# Patient Record
Sex: Male | Born: 1975
Health system: Southern US, Community
[De-identification: ages and names within clinical notes are randomized; demographics above are authoritative.]

## PROBLEM LIST (undated history)

## (undated) DIAGNOSIS — E119 Type 2 diabetes mellitus without complications: Secondary | ICD-10-CM

## (undated) DIAGNOSIS — G43909 Migraine, unspecified, not intractable, without status migrainosus: Secondary | ICD-10-CM

## (undated) DIAGNOSIS — N200 Calculus of kidney: Secondary | ICD-10-CM

## (undated) DIAGNOSIS — I10 Essential (primary) hypertension: Secondary | ICD-10-CM

## (undated) DIAGNOSIS — E7800 Pure hypercholesterolemia, unspecified: Secondary | ICD-10-CM

## (undated) DIAGNOSIS — G4730 Sleep apnea, unspecified: Secondary | ICD-10-CM

## (undated) HISTORY — PX: BACK SURGERY: SHX140

## (undated) HISTORY — DX: Migraine, unspecified, not intractable, without status migrainosus: G43.909

## (undated) HISTORY — PX: MOUTH SURGERY: SHX715

---

## 2003-03-29 ENCOUNTER — Ambulatory Visit (HOSPITAL_COMMUNITY): Admission: RE | Admit: 2003-03-29 | Discharge: 2003-03-30 | Payer: Self-pay | Admitting: Neurosurgery

## 2003-03-29 ENCOUNTER — Encounter: Payer: Self-pay | Admitting: Neurosurgery

## 2003-12-15 ENCOUNTER — Encounter: Payer: Self-pay | Admitting: Internal Medicine

## 2004-09-19 ENCOUNTER — Emergency Department (HOSPITAL_COMMUNITY): Admission: EM | Admit: 2004-09-19 | Discharge: 2004-09-19 | Payer: Self-pay | Admitting: Emergency Medicine

## 2004-11-26 ENCOUNTER — Ambulatory Visit: Payer: Self-pay | Admitting: Internal Medicine

## 2004-12-14 ENCOUNTER — Ambulatory Visit (HOSPITAL_BASED_OUTPATIENT_CLINIC_OR_DEPARTMENT_OTHER): Admission: RE | Admit: 2004-12-14 | Discharge: 2004-12-14 | Payer: Self-pay | Admitting: Otolaryngology

## 2005-04-26 ENCOUNTER — Ambulatory Visit: Payer: Self-pay | Admitting: Internal Medicine

## 2005-05-24 ENCOUNTER — Ambulatory Visit: Payer: Self-pay | Admitting: Internal Medicine

## 2005-07-19 ENCOUNTER — Ambulatory Visit: Payer: Self-pay | Admitting: Internal Medicine

## 2006-03-19 ENCOUNTER — Emergency Department (HOSPITAL_COMMUNITY): Admission: EM | Admit: 2006-03-19 | Discharge: 2006-03-19 | Payer: Self-pay | Admitting: Emergency Medicine

## 2006-03-23 ENCOUNTER — Ambulatory Visit: Payer: Self-pay | Admitting: Internal Medicine

## 2006-03-25 ENCOUNTER — Ambulatory Visit: Payer: Self-pay | Admitting: Internal Medicine

## 2006-04-21 ENCOUNTER — Encounter: Admission: RE | Admit: 2006-04-21 | Discharge: 2006-06-08 | Payer: Self-pay | Admitting: Internal Medicine

## 2006-04-22 ENCOUNTER — Ambulatory Visit: Payer: Self-pay | Admitting: Internal Medicine

## 2006-07-12 ENCOUNTER — Ambulatory Visit: Payer: Self-pay | Admitting: Internal Medicine

## 2006-09-13 ENCOUNTER — Ambulatory Visit: Payer: Self-pay | Admitting: Internal Medicine

## 2006-09-19 ENCOUNTER — Ambulatory Visit: Payer: Self-pay | Admitting: Internal Medicine

## 2006-09-28 ENCOUNTER — Ambulatory Visit: Payer: Self-pay | Admitting: Gastroenterology

## 2006-09-29 ENCOUNTER — Ambulatory Visit: Payer: Self-pay | Admitting: Internal Medicine

## 2006-10-17 ENCOUNTER — Ambulatory Visit: Payer: Self-pay | Admitting: Internal Medicine

## 2006-10-17 LAB — CONVERTED CEMR LAB
Albumin: 4.4 g/dL (ref 3.5–5.2)
Alkaline Phosphatase: 50 units/L (ref 39–117)
HCT: 46.5 % (ref 39.0–52.0)
Hemoglobin: 15.7 g/dL (ref 13.0–17.0)
Hgb A1c MFr Bld: 5.7 % (ref 4.6–6.0)
MCHC: 33.8 g/dL (ref 30.0–36.0)
Platelets: 293 10*3/uL (ref 150–400)
RBC: 5.01 M/uL (ref 4.22–5.81)
Total Bilirubin: 0.5 mg/dL (ref 0.3–1.2)
Total Protein: 7.6 g/dL (ref 6.0–8.3)

## 2007-02-17 ENCOUNTER — Ambulatory Visit: Payer: Self-pay | Admitting: Internal Medicine

## 2007-04-05 DIAGNOSIS — I10 Essential (primary) hypertension: Secondary | ICD-10-CM | POA: Insufficient documentation

## 2007-04-05 DIAGNOSIS — E119 Type 2 diabetes mellitus without complications: Secondary | ICD-10-CM | POA: Insufficient documentation

## 2007-04-05 DIAGNOSIS — E785 Hyperlipidemia, unspecified: Secondary | ICD-10-CM | POA: Insufficient documentation

## 2007-04-05 DIAGNOSIS — Z87442 Personal history of urinary calculi: Secondary | ICD-10-CM | POA: Insufficient documentation

## 2007-04-05 DIAGNOSIS — G43909 Migraine, unspecified, not intractable, without status migrainosus: Secondary | ICD-10-CM

## 2007-04-05 HISTORY — DX: Migraine, unspecified, not intractable, without status migrainosus: G43.909

## 2007-11-07 ENCOUNTER — Ambulatory Visit: Payer: Self-pay | Admitting: Internal Medicine

## 2007-11-07 LAB — CONVERTED CEMR LAB
Bilirubin Urine: NEGATIVE
Glucose, Urine, Semiquant: NEGATIVE
Specific Gravity, Urine: 1.01
WBC Urine, dipstick: NEGATIVE

## 2007-11-28 ENCOUNTER — Encounter (INDEPENDENT_AMBULATORY_CARE_PROVIDER_SITE_OTHER): Payer: Self-pay | Admitting: *Deleted

## 2008-03-05 ENCOUNTER — Encounter: Payer: Self-pay | Admitting: Internal Medicine

## 2008-05-23 ENCOUNTER — Encounter (INDEPENDENT_AMBULATORY_CARE_PROVIDER_SITE_OTHER): Payer: Self-pay | Admitting: *Deleted

## 2008-06-26 ENCOUNTER — Telehealth (INDEPENDENT_AMBULATORY_CARE_PROVIDER_SITE_OTHER): Payer: Self-pay | Admitting: *Deleted

## 2008-07-05 ENCOUNTER — Ambulatory Visit: Payer: Self-pay | Admitting: Internal Medicine

## 2008-07-05 LAB — CONVERTED CEMR LAB: Glucose, Bld: 119 mg/dL

## 2008-08-05 ENCOUNTER — Encounter (INDEPENDENT_AMBULATORY_CARE_PROVIDER_SITE_OTHER): Payer: Self-pay | Admitting: *Deleted

## 2009-01-03 ENCOUNTER — Ambulatory Visit: Payer: Self-pay | Admitting: Internal Medicine

## 2009-01-06 ENCOUNTER — Telehealth (INDEPENDENT_AMBULATORY_CARE_PROVIDER_SITE_OTHER): Payer: Self-pay | Admitting: *Deleted

## 2009-01-08 LAB — CONVERTED CEMR LAB
Basophils Absolute: 0 10*3/uL (ref 0.0–0.1)
Bilirubin, Direct: 0.1 mg/dL (ref 0.0–0.3)
Calcium: 9.1 mg/dL (ref 8.4–10.5)
Cholesterol: 222 mg/dL (ref 0–200)
Direct LDL: 132.6 mg/dL
Eosinophils Absolute: 0.2 10*3/uL (ref 0.0–0.7)
GFR calc Af Amer: 203 mL/min
GFR calc non Af Amer: 168 mL/min
Glucose, Bld: 167 mg/dL — ABNORMAL HIGH (ref 70–99)
HCT: 41.5 % (ref 39.0–52.0)
Hemoglobin: 14.3 g/dL (ref 13.0–17.0)
MCHC: 34.4 g/dL (ref 30.0–36.0)
MCV: 90.9 fL (ref 78.0–100.0)
Monocytes Absolute: 0.4 10*3/uL (ref 0.1–1.0)
Neutro Abs: 3.5 10*3/uL (ref 1.4–7.7)
Platelets: 230 10*3/uL (ref 150–400)
RDW: 11.9 % (ref 11.5–14.6)
Sodium: 138 meq/L (ref 135–145)
TSH: 2.73 microintl units/mL (ref 0.35–5.50)
Total Protein: 7.1 g/dL (ref 6.0–8.3)
Triglycerides: 317 mg/dL (ref 0–149)

## 2009-04-23 ENCOUNTER — Encounter (INDEPENDENT_AMBULATORY_CARE_PROVIDER_SITE_OTHER): Payer: Self-pay | Admitting: *Deleted

## 2009-06-27 ENCOUNTER — Telehealth: Payer: Self-pay | Admitting: Internal Medicine

## 2009-07-08 ENCOUNTER — Encounter (INDEPENDENT_AMBULATORY_CARE_PROVIDER_SITE_OTHER): Payer: Self-pay | Admitting: *Deleted

## 2009-10-27 ENCOUNTER — Telehealth (INDEPENDENT_AMBULATORY_CARE_PROVIDER_SITE_OTHER): Payer: Self-pay | Admitting: *Deleted

## 2009-10-31 ENCOUNTER — Encounter (INDEPENDENT_AMBULATORY_CARE_PROVIDER_SITE_OTHER): Payer: Self-pay | Admitting: *Deleted

## 2010-01-22 ENCOUNTER — Ambulatory Visit: Payer: Self-pay | Admitting: Internal Medicine

## 2010-01-22 DIAGNOSIS — G4733 Obstructive sleep apnea (adult) (pediatric): Secondary | ICD-10-CM | POA: Insufficient documentation

## 2010-01-26 ENCOUNTER — Encounter (INDEPENDENT_AMBULATORY_CARE_PROVIDER_SITE_OTHER): Payer: Self-pay | Admitting: *Deleted

## 2010-01-26 LAB — CONVERTED CEMR LAB
ALT: 135 units/L — ABNORMAL HIGH (ref 0–53)
Chloride: 103 meq/L (ref 96–112)
Cholesterol: 270 mg/dL — ABNORMAL HIGH (ref 0–200)
Creatinine, Ser: 0.6 mg/dL (ref 0.4–1.5)
Direct LDL: 175.3 mg/dL
HDL: 38.3 mg/dL — ABNORMAL LOW (ref >39.00)
Microalb Creat Ratio: 7.7 mg/g (ref 0.0–30.0)
Sodium: 140 meq/L (ref 135–145)
Total CHOL/HDL Ratio: 7
Triglycerides: 319 mg/dL — ABNORMAL HIGH (ref 0.0–149.0)

## 2010-01-27 ENCOUNTER — Telehealth (INDEPENDENT_AMBULATORY_CARE_PROVIDER_SITE_OTHER): Payer: Self-pay | Admitting: *Deleted

## 2010-01-27 ENCOUNTER — Encounter (INDEPENDENT_AMBULATORY_CARE_PROVIDER_SITE_OTHER): Payer: Self-pay | Admitting: *Deleted

## 2010-01-29 ENCOUNTER — Telehealth (INDEPENDENT_AMBULATORY_CARE_PROVIDER_SITE_OTHER): Payer: Self-pay | Admitting: *Deleted

## 2010-01-29 ENCOUNTER — Ambulatory Visit: Payer: Self-pay | Admitting: Internal Medicine

## 2010-02-16 ENCOUNTER — Telehealth: Payer: Self-pay | Admitting: Internal Medicine

## 2010-02-18 ENCOUNTER — Telehealth: Payer: Self-pay | Admitting: Internal Medicine

## 2010-03-04 ENCOUNTER — Telehealth (INDEPENDENT_AMBULATORY_CARE_PROVIDER_SITE_OTHER): Payer: Self-pay | Admitting: *Deleted

## 2011-01-12 NOTE — Miscellaneous (Signed)
Summary: SEVERE OSA  NAME:  Douglas Phillips, Douglas Phillips            ACCOUNT NO.:  [id number]   MEDICAL RECORD NO.:  [id number]          PATIENT TYPE:  OUT   LOCATION:  SLEEP CENTER                 FACILITY:  [facility]   PHYSICIAN:  Clinton D. Maple Hudson, M.D. DATE OF BIRTH:  16-Sep-1976   DATE OF STUDY:  12/14/2004                              NOCTURNAL POLYSOMNOGRAM   INDICATIONS FOR STUDY:  Hypersomnia with sleep apnea. Epworth sleepiness  score 14/24, neck size 16.5 inches, BMI 36.8, weight 235 pounds.   SLEEP ARCHITECTURE:  Total sleep time 296 minutes with sleep efficiency of  80%. Stage I was 8%, stage II was 43%, stages III and IV were 24%, REM was  25% of total sleep time. Latency to sleep onset 11.5 minutes. Latency to REM  112 minutes. Awake after sleep onset 64 minutes. Arousal index 55. Sleep was  fragmented by repeated wakenings until approximately 2:30 a.m. Zycam was  taken at h.s.   RESPIRATORY DATA:  Split study protocol.  RDI 120 per hour indicating very  severe obstructive sleep apnea/hypopnea syndrome before CPAP. This included  164 obstructive apneas and 73 hypopneas before CPAP. The events were not  positional.  REM RDI of 49.8 per hour. CPAP was titrated to 14 CWP, RDI 2.7  per hour using a Resmed Swift with medium nasal pillows, subsequently  switched to a Resmed Ultra Mirage full face mask due to mouth breathing, and  the patient complained of nasal congestion. Heated humidifier.   OXYGEN DATA:  Very loud snoring with oxygen desaturation to a nadir of 67%  before CPAP control. After CPAP, oxygen saturation held 95% to 98% on room  air.   CARDIAC DATA:  Normal sinus rhythm.   MOVEMENT/PARASOMNIA:  Occasional leg jerk.   IMPRESSION/RECOMMENDATIONS:  Very severe obstructive sleep apnea/hypopnea  syndrome, RDI 120 per hour with oxygen desaturation to a nadir of 67%.  Successful CPAP titration to 14 CWP using a Resmed Ultra Mirage full face  mask with heated humidifier.                                                         Clinton D. Maple Hudson, M.D.  Diplomate, [employer] Board   CDY/MEDQ  D:  12/20/2004 08:43:47  T:  12/20/2004 09:22:52  Job:  [id number]  Clinical Lists Changes

## 2011-01-12 NOTE — Progress Notes (Signed)
Summary: left msg for pt to call  Phone Note Outgoing Call Call back at cell [phone]   Call placed by: Kandice Hams,  March 04, 2010 4:10 PM Call placed to: Patient Summary of Call: left msg for pt to call to give Dr Drue Novel recommendatons re; CBGs .Kandice Hams  March 04, 2010 4:11 PM left msg for pt to call .Kandice Hams  March 05, 2010 9:08 AM    Initial call taken by: Kandice Hams,  March 04, 2010 4:11 PM  Follow-up for Phone Call        Spoke with pt informed of Dr Drue Novel recommendations, he says his machine is working now did not need new machine.  OV scheduled .Kandice Hams  March 05, 2010 10:58 AM  Follow-up by: Kandice Hams,  March 05, 2010 10:58 AM

## 2011-01-12 NOTE — Progress Notes (Signed)
Summary: discuss lab results/ left msg to call  Phone Note Call from Patient Call back at Home Phone [phone]   Caller: Spouse Summary of Call: pt had appt today to discuss lab work and could not wait per wife, had a parent teacher conference.  Patient is leaving for [facility] soon and will be gone for the next 3 weeks or longer and wants to know would you be willing to discuss lab results via phone? Initial call taken by: Kandice Hams,  January 29, 2010 2:51 PM  Follow-up for Phone Call        diabetes is uncontrolled, needs  treatment but his increased LFTs prevent me from prescribing most oral medicines , I was planning to start Lantus. since the patient is going out of town in 4 weeks, instead of Lantus we can try Amaryl 2 mg one p.o. daily. Watch for low sugar symptoms (shakes, sweats, low energy) call with CBGsin two weeks Follow-up by: Elita Quick E. Paz MD,  January 30, 2010 9:45 AM  Additional Follow-up for Phone Call Additional follow up Details #1::        spoke with pt wife who gave me pt cell phone # [phone], called pt got VM, left msg to call .Kandice Hams  January 30, 2010 2:28 PM  Additional Follow-up by: Kandice Hams,  January 30, 2010 2:28 PM    Additional Follow-up for Phone Call Additional follow up Details #2::    pt informed of labs and Dr Drue Novel recommendations  and instructions  re BSrx [facility] to [facility] [address] .Kandice Hams  January 30, 2010 2:49 PM   New/Updated Medications: AMARYL 2 MG TABS (GLIMEPIRIDE) 1 by mouth once daily PRECISION XTRA BLOOD GLUCOSE  STRP (GLUCOSE BLOOD) test three times a day Prescriptions: PRECISION XTRA BLOOD GLUCOSE  STRP (GLUCOSE BLOOD) test three times a day  #100 x 2   Entered by:   Kandice Hams   Authorized by:   Nolon Rod. Paz MD   Signed by:   Kandice Hams on 01/30/2010   Method used:   Faxed to ...       [facility]  [address] [id number]* (retail)       [address]       [place], [place]   [address]       Ph: [phone] or [phone]       Fax: [phone]   RxID:   [id number] AMARYL 2 MG TABS (GLIMEPIRIDE) 1 by mouth once daily  #30 x 1   Entered by:   Kandice Hams   Authorized by:   Nolon Rod. Paz MD   Signed by:   Kandice Hams on 01/30/2010   Method used:   Faxed to ...       [facility]  [address] [id number]* (retail)       [address]       [place], [place]  [address]       Ph: [phone] or [phone]       Fax: [phone]   RxID:   [id number]

## 2011-01-12 NOTE — Progress Notes (Signed)
Summary: order for new cpap  [facility] Shary Decamp  January 27, 2010 4:57 PM

## 2011-01-12 NOTE — Progress Notes (Signed)
Summary: Glucose Log & Letter from Patient  Glucose Log & Letter from Patient   Imported By: Lanelle Bal 02/20/2010 09:18:39  _____________________________________________________________________  External Attachment:    Type:   Image     Comment:   External Document  Appended Document: Glucose Log & Letter from Patient continue with a better diet I don't know why he needs to check his CBGs 3 times.  If needed, given him a new machine  he was to start her Amaryl 2 mg one tablet daily, if he feels he can't  tolerate, take only half tablet daily he really needs to come back and be seen.  Please arrange  Appended Document: Glucose Log & Letter from Patient pt informed, ov scheduled

## 2011-01-12 NOTE — Assessment & Plan Note (Signed)
Summary: fu/ns/kdc   Vital Signs:  Patient profile:   35 year old male Height:      67.75 inches Weight:      273 pounds BMI:     41.97 Pulse rate:   56 / minute BP sitting:   134 / 82  Vitals Entered By: rachel peeler CC: f/u   History of Present Illness: last OV > 1 year ago  Diabetes -- no ambulatory CBGs   HTN-- no ambulatory BPs except x 1 time, he went to a UC and it was ok when he takes his toprol   few years ago was dx w/ OSA (new dx to me), uses a CPAP, needs a new equipment    Allergies: No Known Drug Allergies  Past History:  Past Medical History: Diabetes mellitus, type II--Dx 2007 A1C 6.4 Hyperlipidemia : 2007 --->T chol 277, LDL 236 Hypertension NEPHROLITHIASIS, HX OF  MIGRAINE HEADACHE (ICD-346.90) 2007 increaed LFTs, Hep B-C (-) , u/s showed liver echogenicity, slightly  enlarged spleen h/o OSA , on a CPAP   Past Surgical History: Reviewed history from 01/03/2009 and no changes required. 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 CV:  Denies chest pain or discomfort and swelling of feet. Resp:  Denies cough and shortness of breath; sleeps ok w/  the CPAP occasionally sleep through the day depending on how many hours he sleep   .  Physical Exam  General:  alert and overweight-appearing.   Lungs:  normal respiratory effort, no intercostal retractions, no accessory muscle use, and normal breath sounds.   Heart:  normal rate, regular rhythm, and no murmur.   Extremities:  no pretibial edema bilaterally  Psych:  Cognition and judgment appear intact. Alert and cooperative with normal attention span and concentration.    Impression & Recommendations:  Problem # 1:  SLEEP APNEA, OBSTRUCTIVE (ICD-327.23) patient needs  a new machine , also he's pressure may need to be adjusted referral to a respiratory  therapist declined a  prescription of a nose spray to help with congestion  Problem # 2:  HYPERTENSION (ICD-401.9) no change  His updated medication list for this problem includes:    Toprol Xl 100 Mg Xr24h-tab (Metoprolol succinate) .Marland Kitchen... 1 by mouth once daily  Orders: Venipuncture ([address]) TLB-BMP (Basic Metabolic Panel-BMET) (80048-METABOL) TLB-ALT (SGPT) (84460-ALT) TLB-AST (SGOT) (84450-SGOT)  BP today: 134/82 Prior BP: 118/80 (01/03/2009)  Labs Reviewed: K+: 3.8 (01/03/2009) Creat: : 0.6 (01/03/2009)   Chol: 222 (01/03/2009)   HDL: 29.0 (01/03/2009)   LDL: DEL (01/03/2009)   TG: 317 (01/03/2009)  Problem # 3:  HYPERLIPIDEMIA (ICD-272.4)  labs, on no meds d/t h/o elevated LFTs Orders: TLB-Lipid Panel (80061-LIPID)  Labs Reviewed: SGOT: 52 (01/03/2009)   SGPT: 135 (01/03/2009)   HDL:29.0 (01/03/2009)  LDL:DEL (01/03/2009)  Chol:222 (01/03/2009)  Trig:317 (01/03/2009)  Problem # 4:  DIABETES MELLITUS, TYPE II (ICD-250.00) and diet only, labs Orders: TLB-A1C / Hgb A1C (Glycohemoglobin) (83036-A1C) TLB-Microalbumin/Creat Ratio, Urine (82043-MALB)  Labs Reviewed: Creat: 0.6 (01/03/2009)    Reviewed HgBA1c results: 7.4 (01/03/2009)  5.7 (10/17/2006)  Complete Medication List: 1)  Toprol Xl 100 Mg Xr24h-tab (Metoprolol succinate) .Marland Kitchen.. 1 by mouth once daily 2)  Imitrex 50 Mg Tabs (Sumatriptan succinate) .Marland Kitchen.. 1 by mouth q2hours as needed ha up to 4 tablets qd 3)  Precision Xtra Blood  Glucose Strp (Glucose blood) .... Pt test three times a day 4)  Claritin 10 Mg Caps (Loratadine) .Marland Kitchen.. 1 by mouth once daily  Patient Instructions: 1)  Please schedule a follow-up appointment in 3 months   (physical exam)

## 2011-01-12 NOTE — Letter (Signed)
Summary: Primary Care Appointment Letter  [facility] [place]  [address] [place], [place] [address]   Phone: [phone]  Fax: [phone]    01/27/2010 MRN: [id number]  Douglas Phillips [address] [place], [place]  [address]  Dear Mr. SHREWSBERRY,   Your Primary Care Physician Nolon Rod. Paz MD has indicated that:    _______it is time to schedule an appointment.    _______you missed your appointment on______ and need to call and          reschedule.    _______you need to have lab work done.    _______you need to schedule an appointment discuss lab or test results.    ____X___ YOUR APPOINTMENT FOR Apr 28, 2010 AT 8:00 AM HAS BEEN CHANGED TO 8:40 AM THAT MORNING.                      Please call our office as soon as possible. Our phone number is [phone]          _________. Please press option 1. Our office is open 8a-12noon and 1p-5p, Monday through Friday.     Thank you,    [facility]

## 2011-01-12 NOTE — Assessment & Plan Note (Signed)
Summary: discuss labs/kdc   Vital Signs:  Patient profile:   35 year old male Height:      67.75 inches Weight:      272 pounds BMI:     41.81 Pulse rate:   60 / minute BP sitting:   120 / 72  Vitals Entered By: rachel peeler CC: discuss labs Comments pt. couldnt wait, had to leave   Allergies: No Known Drug Allergies   Complete Medication List: 1)  Toprol Xl 100 Mg Xr24h-tab (Metoprolol succinate) .Marland Kitchen.. 1 by mouth once daily 2)  Imitrex 50 Mg Tabs (Sumatriptan succinate) .Marland Kitchen.. 1 by mouth q2hours as needed ha up to 4 tablets qd  Other Orders: No Charge Patient Arrived (NCPA0) (NCPA0)

## 2011-01-12 NOTE — Progress Notes (Signed)
Summary: cpap order  Phone Note Call from Patient   Caller: [facility] Summary of Call: REP FROM [facility] CAME BY TO PICK UP ORDER FOR NEW CPAP MACHINE---SAYS HE NEEDS ORDER:  1)  TO SHOW TODAY'S DATE 2)  TO SHOW PRESSURE ON CPAP 3)  TO SHOW COPY OF SLEEP STUDY  PLEASE FAX ORDER TO [facility] AT [phone] Initial call taken by: Jerolyn Shin,  February 18, 2010 2:41 PM  Follow-up for Phone Call         - I found copy of sleep study in e-chart from 2005.Marland KitchenMarland KitchenI copy & pasted into EMR -- needs your sig.    - on sleep study in 2005 - CPAP was titrated to 14.  Left message for pt to return call -- need to know what his CPAP is currently set on & does he feel like it is ok??  sleeping ok??  fatigue?? Shary Decamp  February 18, 2010 3:38 PM agree, either the  respiratory therapies fr a pulmonology  needs  to see if the settings are  appropriate Claudene Gatliff E. Shawnise Peterkin MD  February 19, 2010 10:03 AM  Spoke with pt -- his CPAP is currently set @ 14.  Patient states that he feels like it is working for him.  States that he feels like he needs a smaller mask. Shary Decamp  February 24, 2010 10:30 AM  ATTN: [facility] CURRENT CPAP SETTING - 14 please evaluate current setting to make sure it is appropriate for pt  Willow Ora, MD 02/24/10 Pedram Goodchild E. Sterling Mondo MD  February 25, 2010 4:49 PM    Follow-up by: Shary Decamp,  February 24, 2010 10:31 AM

## 2011-01-12 NOTE — Letter (Signed)
Summary: Primary Care Appointment Letter  [facility] [place]  [address] [place], [place] [address]   Phone: [phone]  Fax: [phone]    01/26/2010 MRN: [id number]  Douglas Phillips [address] [place], [place]  [address]  Dear [name],   Your Primary Care Physician Nolon Rod. Paz MD has indicated that:   ___x____you need to schedule an appointment discuss lab or test results.    Please call our office as soon as possible. Our phone number is [phone]          [phone]. Please press option 1. Our office is open 8a-12noon and 1p-5p, Monday through Friday.     Thank you,    [facility]

## 2011-02-10 ENCOUNTER — Other Ambulatory Visit (HOSPITAL_COMMUNITY): Payer: Self-pay

## 2011-04-30 NOTE — Op Note (Signed)
NAME:  Douglas Phillips, Douglas Phillips                      ACCOUNT NO.:  [id number]   MEDICAL RECORD NO.:  [id number]                   PATIENT TYPE:  OIB   LOCATION:  2899                                 FACILITY:  [facility]   PHYSICIAN:  Kathaleen Maser. Pool, M.D.                 DATE OF BIRTH:  1976/05/21   DATE OF PROCEDURE:  03/29/2003  DATE OF DISCHARGE:                                 OPERATIVE REPORT   PREOPERATIVE DIAGNOSIS:  Right L5-S1 herniated nucleus pulposus with  radiculopathy.   POSTOPERATIVE DIAGNOSIS:  Right L5-S1 herniated nucleus pulposus with  radiculopathy.   PROCEDURE:  Right L5-S1 laminotomy with microdiskectomy.   SURGEON:  Kathaleen Maser. Pool, M.D.   ASSISTANT:  Reinaldo Meeker, M.D.   ANESTHESIA:  General endotracheal.   INDICATIONS:  The patient is a 35 year old male with a history of chronic  back and right lower extremity pain consistent with a right-sided S1  radiculopathy, which has failed conservative management.  MRI scan  demonstrates evidence of a focal rightward L5-S1 disk herniation with  significant coexistent degenerative disk disease.  The remainder of his  spine looks healthy.  We have discussed options available for management,  including the possibility of undergoing a right-sided L5-S1 laminotomy and  microdiskectomy for hopeful improvement of his symptoms.  The patient is  aware of the risks and benefits involved with the surgery and wishes to  proceed.   DESCRIPTION OF PROCEDURE:  The patient was taken to the operating room and  placed on the operating table in the supine position.  After an adequate  level of anesthesia was achieved, patient positioned prone onto a Wilson  frame and appropriately padded.  The patient's lumbar region is prepped and  draped sterilely.  A 10 blade is used to make a linear skin incision  overlying the L5-S1 interspace.  This is carried down sharply.  A  subperiosteal dissection is performed, exposing the laminae and facet  joints  at L5 and S1.  A deep self-retaining retractor is placed, intraoperative x-  ray is taken, and the level was confirmed.  A laminotomy is then performed  using the high-speed drill and Kerrison rongeurs to remove the inferior  aspect of the lamina of L5, the medial edge of the L5-S1 facet joint, and  the superior rim of the S1 lamina.  Ligamentum flavum was then elevated and  resected in piecemeal fashion using Kerrison rongeurs.  The underlying  thecal sac and exiting S1 nerve root were identified.  The microscope was  brought into the field and used for microdissection of the right-sided S1  nerve root underlying disk herniation.  Epidural venous plexus was  coagulated and cut.  The thecal sac and S1 nerve root were mobilized and  retracted toward the midline.  The disk herniation was readily apparent.  This was then incised with a 15 blade in a rectangular fashion.  A wide disk  space clean-out was then achieved using pituitary rongeurs, upward-angled  pituitary rongeurs, and Epstein curettes.  All loose or obviously  degenerative disk material was removed from the interspace.  All elements of  disk herniation were completely resected.  At this point a very thorough  diskectomy had been performed.  There was no evidence of any continued  compression of the thecal sac or nerve root.  The wound was then irrigated  with antibiotic solution.  Gelfoam was placed topically for hemostasis and  found to be good.  The  microscope and retractor system were removed.  Hemostasis of the muscle was  achieved with electrocautery.  The wound was then closed in layers with  Vicryl sutures.  Steri-Strips and a sterile dressing were applied.  There  were no apparent complications.  The patient tolerated the procedure well,  and he returns to the recovery room postop.                                               Henry A. Pool, M.D.    HAP/MEDQ  D:  03/29/2003  T:  03/29/2003  Job:  [id number]

## 2011-04-30 NOTE — Procedures (Signed)
Douglas Phillips, Douglas Phillips            ACCOUNT NO.:  [id number]   MEDICAL RECORD NO.:  [id number]          PATIENT TYPE:  OUT   LOCATION:  SLEEP CENTER                 FACILITY:  [facility]   PHYSICIAN:  Clinton D. Maple Hudson, M.D. DATE OF BIRTH:  1976-09-05   DATE OF STUDY:  12/14/2004                              NOCTURNAL POLYSOMNOGRAM   INDICATIONS FOR STUDY:  Hypersomnia with sleep apnea. Epworth sleepiness  score 14/24, neck size 16.5 inches, BMI 36.8, weight 235 pounds.   SLEEP ARCHITECTURE:  Total sleep time 296 minutes with sleep efficiency of  80%. Stage I was 8%, stage II was 43%, stages III and IV were 24%, REM was  25% of total sleep time. Latency to sleep onset 11.5 minutes. Latency to REM  112 minutes. Awake after sleep onset 64 minutes. Arousal index 55. Sleep was  fragmented by repeated wakenings until approximately 2:30 a.m. Zycam was  taken at h.s.   RESPIRATORY DATA:  Split study protocol.  RDI 120 per hour indicating very  severe obstructive sleep apnea/hypopnea syndrome before CPAP. This included  164 obstructive apneas and 73 hypopneas before CPAP. The events were not  positional.  REM RDI of 49.8 per hour. CPAP was titrated to 14 CWP, RDI 2.7  per hour using a Resmed Swift with medium nasal pillows, subsequently  switched to a Resmed Ultra Mirage full face mask due to mouth breathing, and  the patient complained of nasal congestion. Heated humidifier.   OXYGEN DATA:  Very loud snoring with oxygen desaturation to a nadir of 67%  before CPAP control. After CPAP, oxygen saturation held 95% to 98% on room  air.   CARDIAC DATA:  Normal sinus rhythm.   MOVEMENT/PARASOMNIA:  Occasional leg jerk.   IMPRESSION/RECOMMENDATIONS:  Very severe obstructive sleep apnea/hypopnea  syndrome, RDI 120 per hour with oxygen desaturation to a nadir of 67%.  Successful CPAP titration to 14 CWP using a Resmed Ultra Mirage full face  mask with heated humidifier.                                  Clinton D. Maple Hudson, M.D.  Diplomate, [employer]   CDY/MEDQ  D:  12/20/2004 08:43:47  T:  12/20/2004 09:22:52  Job:  [id number]

## 2014-12-29 ENCOUNTER — Emergency Department (HOSPITAL_COMMUNITY)
Admission: EM | Admit: 2014-12-29 | Discharge: 2014-12-30 | Disposition: A | Discharge status: Home / Self Care | Payer: BLUE CROSS/BLUE SHIELD | Attending: Emergency Medicine | Admitting: Emergency Medicine

## 2014-12-29 ENCOUNTER — Encounter (HOSPITAL_COMMUNITY): Payer: Self-pay | Admitting: Emergency Medicine

## 2014-12-29 DIAGNOSIS — Z7951 Long term (current) use of inhaled steroids: Secondary | ICD-10-CM | POA: Insufficient documentation

## 2014-12-29 DIAGNOSIS — R109 Unspecified abdominal pain: Secondary | ICD-10-CM | POA: Diagnosis present

## 2014-12-29 DIAGNOSIS — I10 Essential (primary) hypertension: Secondary | ICD-10-CM | POA: Diagnosis not present

## 2014-12-29 DIAGNOSIS — E780 Pure hypercholesterolemia: Secondary | ICD-10-CM | POA: Insufficient documentation

## 2014-12-29 DIAGNOSIS — N23 Unspecified renal colic: Secondary | ICD-10-CM

## 2014-12-29 DIAGNOSIS — Z79899 Other long term (current) drug therapy: Secondary | ICD-10-CM | POA: Insufficient documentation

## 2014-12-29 DIAGNOSIS — E1165 Type 2 diabetes mellitus with hyperglycemia: Secondary | ICD-10-CM | POA: Insufficient documentation

## 2014-12-29 DIAGNOSIS — R739 Hyperglycemia, unspecified: Secondary | ICD-10-CM

## 2014-12-29 HISTORY — DX: Pure hypercholesterolemia, unspecified: E78.00

## 2014-12-29 HISTORY — DX: Essential (primary) hypertension: I10

## 2014-12-29 HISTORY — DX: Type 2 diabetes mellitus without complications: E11.9

## 2014-12-29 HISTORY — DX: Calculus of kidney: N20.0

## 2014-12-29 LAB — URINALYSIS, ROUTINE W REFLEX MICROSCOPIC
Bilirubin Urine: NEGATIVE
Ketones, ur: NEGATIVE mg/dL
LEUKOCYTES UA: NEGATIVE
NITRITE: NEGATIVE
PROTEIN: NEGATIVE mg/dL
SPECIFIC GRAVITY, URINE: 1.034 — AB (ref 1.005–1.030)
Urobilinogen, UA: 1 mg/dL (ref 0.0–1.0)
pH: 5 (ref 5.0–8.0)

## 2014-12-29 LAB — I-STAT CHEM 8, ED
BUN: 14 mg/dL (ref 6–23)
CREATININE: 0.7 mg/dL (ref 0.50–1.35)
Calcium, Ion: 1.13 mmol/L (ref 1.12–1.23)
Chloride: 101 mEq/L (ref 96–112)
GLUCOSE: 306 mg/dL — AB (ref 70–99)
HCT: 43 % (ref 39.0–52.0)
HEMOGLOBIN: 14.6 g/dL (ref 13.0–17.0)
POTASSIUM: 4 mmol/L (ref 3.5–5.1)
SODIUM: 138 mmol/L (ref 135–145)
TCO2: 23 mmol/L (ref 0–100)

## 2014-12-29 MED ORDER — SODIUM CHLORIDE 0.9 % IV BOLUS (SEPSIS): 1000.0000 mL | Freq: Once | INTRAVENOUS | Status: DC

## 2014-12-29 MED ORDER — KETOROLAC TROMETHAMINE 30 MG/ML IJ SOLN
30.0000 mg | Freq: Once | INTRAMUSCULAR | Status: DC
  Filled 2014-12-29: qty 1

## 2014-12-29 NOTE — ED Notes (Signed)
Patient states that he has had kidney stones in the past and feels the same feeling now. R sided flank pain. Has subsided some but still has the urge to urinate. Alert and oriented.

## 2014-12-30 LAB — CBC WITH DIFFERENTIAL/PLATELET
Basophils Absolute: 0 K/uL (ref 0.0–0.1)
Basophils Relative: 0 % (ref 0–1)
Eosinophils Absolute: 0.2 K/uL (ref 0.0–0.7)
Eosinophils Relative: 3 % (ref 0–5)
HCT: 41.6 % (ref 39.0–52.0)
Hemoglobin: 14.6 g/dL (ref 13.0–17.0)
Lymphocytes Relative: 38 % (ref 12–46)
Lymphs Abs: 3.2 K/uL (ref 0.7–4.0)
MCH: 31.7 pg (ref 26.0–34.0)
MCHC: 35.1 g/dL (ref 30.0–36.0)
MCV: 90.2 fL (ref 78.0–100.0)
Monocytes Absolute: 0.4 K/uL (ref 0.1–1.0)
Monocytes Relative: 5 % (ref 3–12)
Neutro Abs: 4.5 K/uL (ref 1.7–7.7)
Neutrophils Relative %: 54 % (ref 43–77)
Platelets: 226 K/uL (ref 150–400)
RBC: 4.61 MIL/uL (ref 4.22–5.81)
RDW: 12.2 % (ref 11.5–15.5)
WBC: 8.4 K/uL (ref 4.0–10.5)

## 2014-12-30 LAB — URINE MICROSCOPIC-ADD ON

## 2014-12-30 MED ORDER — TAMSULOSIN HCL 0.4 MG PO CAPS: 0.4000 mg | ORAL_CAPSULE | Freq: Every day | ORAL | Status: DC

## 2014-12-30 MED ORDER — KETOROLAC TROMETHAMINE 60 MG/2ML IM SOLN
60.0000 mg | Freq: Once | INTRAMUSCULAR | Status: AC
  Administered 2014-12-30: 60 mg via INTRAMUSCULAR
  Filled 2014-12-30: qty 2

## 2014-12-30 MED ORDER — ONDANSETRON HCL 4 MG PO TABS: 4.0000 mg | ORAL_TABLET | Freq: Three times a day (TID) | ORAL | Status: DC | PRN

## 2014-12-30 MED ORDER — OXYCODONE-ACETAMINOPHEN 5-325 MG PO TABS: 1.0000 | ORAL_TABLET | ORAL | Status: DC | PRN

## 2014-12-30 NOTE — ED Provider Notes (Signed)
CSN: [id number]     Arrival date & time 12/29/14  2018 History   First MD Initiated Contact with Patient 12/29/14 2245     Chief Complaint  Patient presents with  . Flank Pain     (Consider location/radiation/quality/duration/timing/severity/associated sxs/prior Treatment) The history is provided by the patient.     Pt with hx kidney stones, DM, p/w right flank pain that began this evening, sharp, 10/10 intensity, lasted 20-30 minutes and resolved, now with mild ache in his right side.  States it felt like "The Pain," identical to his prior kidney stones.  Had dysuria without noted hematuria.  Associated nausea.  Denies fevers, vomiting, testicular pain, swelling, penile discharge, change in bowel habits.  Does not have urologist and never had to have urologic procedure before.  Has had kidney stones twice before and passed both.   Past Medical History  Diagnosis Date  . Kidney stones   . Diabetes mellitus without complication   . Hypertension   . High cholesterol    Past Surgical History  Procedure Laterality Date  . Back surgery     No family history on file. History  Substance Use Topics  . Smoking status: Never Smoker   . Smokeless tobacco: Not on file  . Alcohol Use: No    Review of Systems  All other systems reviewed and are negative.     Allergies  Review of patient's allergies indicates no known allergies.  Home Medications   Prior to Admission medications   Medication Sig Start Date End Date Taking? Authorizing Provider  atorvastatin (LIPITOR) 20 MG tablet Take 20 mg by mouth daily.  09/23/14  Yes Historical Provider, MD  fluticasone (FLONASE) 50 MCG/ACT nasal spray Place 1 spray into both nostrils daily.  10/30/14  Yes Historical Provider, MD  metFORMIN (GLUCOPHAGE) 1000 MG tablet Take 500 mg by mouth 2 (two) times daily with a meal.  11/25/14  Yes Historical Provider, MD  oxymetazoline (AFRIN) 0.05 % nasal spray Place 1 spray into both nostrils 2 (two)  times daily as needed for congestion.   Yes Historical Provider, MD   BP 157/83 mmHg  Pulse 62  Temp(Src) 97.9 F (36.6 C) (Oral)  SpO2 98% Physical Exam  Constitutional: He appears well-developed and well-nourished. No distress.  HENT:  Head: Normocephalic and atraumatic.  Neck: Neck supple.  Cardiovascular: Normal rate and regular rhythm.   Pulmonary/Chest: Effort normal and breath sounds normal. No respiratory distress. He has no wheezes. He has no rales.  Abdominal: Soft. He exhibits no distension and no mass. There is tenderness (mild right sided abdominal tenderness, nonfocal). There is no rebound and no guarding.  Neurological: He is alert. He exhibits normal muscle tone.  Skin: He is not diaphoretic.  Nursing note and vitals reviewed.   ED Course  Procedures (including critical care time) Labs Review Labs Reviewed  URINALYSIS, ROUTINE W REFLEX MICROSCOPIC - Abnormal; Notable for the following:    APPearance CLOUDY (*)    Specific Gravity, Urine 1.034 (*)    Glucose, UA >1000 (*)    Hgb urine dipstick LARGE (*)    All other components within normal limits  URINE MICROSCOPIC-ADD ON - Abnormal; Notable for the following:    Bacteria, UA MANY (*)    Casts GRANULAR CAST (*)    Crystals CA OXALATE CRYSTALS (*)    All other components within normal limits  I-STAT CHEM 8, ED - Abnormal; Notable for the following:    Glucose, Bld 306 (*)  All other components within normal limits  URINE CULTURE  CBC WITH DIFFERENTIAL    Imaging Review No results found.   EKG Interpretation None       12:13 AM Pt declined IV and IVF.    MDM   Final diagnoses:  Renal colic on right side  Hyperglycemia   Afebrile, nontoxic patient with right flank pain and dysuria c/w kidney stone pain.  Found to have hematuria and glucosuria, blood glucose  306.  Pt declined IVF.  Takes metformin at home but ate donuts today.  UA does not appear infected.  Will send for culture.  WBC normal,  Hgb, kidney function normal.  D/C home with percocet, zofran, flomax, urology follow up.   PCP follow up, pt advised to monitor blood sugars closely.  Discussed result, findings, treatment, and follow up  with patient.  Pt given return precautions.  Pt verbalizes understanding and agrees with plan.        Trixie Dredge, PA-C 12/30/14 0044  Gerhard Munch, MD 12/30/14 2312

## 2014-12-30 NOTE — Discharge Instructions (Signed)
Read the information below.  Use the prescribed medication as directed.  Please discuss all new medications with your pharmacist.  Do not take additional tylenol while taking the prescribed pain medication to avoid overdose.  You may return to the Emergency Department at any time for worsening condition or any new symptoms that concern you.  If you develop high fevers, worsening abdominal pain, uncontrolled vomiting, or are unable to tolerate fluids by mouth, return to the ER for a recheck.   Please monitor your blood sugars closely and follow a diabetic diet.     Kidney Stones Kidney stones (urolithiasis) are deposits that form inside your kidneys. The intense pain is caused by the stone moving through the urinary tract. When the stone moves, the ureter goes into spasm around the stone. The stone is usually passed in the urine.  CAUSES   A disorder that makes certain neck glands produce too much parathyroid hormone (primary hyperparathyroidism).  A buildup of uric acid crystals, similar to gout in your joints.  Narrowing (stricture) of the ureter.  A kidney obstruction present at birth (congenital obstruction).  Previous surgery on the kidney or ureters.  Numerous kidney infections. SYMPTOMS   Feeling sick to your stomach (nauseous).  Throwing up (vomiting).  Blood in the urine (hematuria).  Pain that usually spreads (radiates) to the groin.  Frequency or urgency of urination. DIAGNOSIS   Taking a history and physical exam.  Blood or urine tests.  CT scan.  Occasionally, an examination of the inside of the urinary bladder (cystoscopy) is performed. TREATMENT   Observation.  Increasing your fluid intake.  Extracorporeal shock wave lithotripsy--This is a noninvasive procedure that uses shock waves to break up kidney stones.  Surgery may be needed if you have severe pain or persistent obstruction. There are various surgical procedures. Most of the procedures are  performed with the use of small instruments. Only small incisions are needed to accommodate these instruments, so recovery time is minimized. The size, location, and chemical composition are all important variables that will determine the proper choice of action for you. Talk to your health care provider to better understand your situation so that you will minimize the risk of injury to yourself and your kidney.  HOME CARE INSTRUCTIONS   Drink enough water and fluids to keep your urine clear or pale yellow. This will help you to pass the stone or stone fragments.  Strain all urine through the provided strainer. Keep all particulate matter and stones for your health care provider to see. The stone causing the pain may be as small as a grain of salt. It is very important to use the strainer each and every time you pass your urine. The collection of your stone will allow your health care provider to analyze it and verify that a stone has actually passed. The stone analysis will often identify what you can do to reduce the incidence of recurrences.  Only take over-the-counter or prescription medicines for pain, discomfort, or fever as directed by your health care provider.  Make a follow-up appointment with your health care provider as directed.  Get follow-up X-rays if required. The absence of pain does not always mean that the stone has passed. It may have only stopped moving. If the urine remains completely obstructed, it can cause loss of kidney function or even complete destruction of the kidney. It is your responsibility to make sure X-rays and follow-ups are completed. Ultrasounds of the kidney can show blockages and the  status of the kidney. Ultrasounds are not associated with any radiation and can be performed easily in a matter of minutes. SEEK MEDICAL CARE IF:  You experience pain that is progressive and unresponsive to any pain medicine you have been prescribed. SEEK IMMEDIATE MEDICAL CARE  IF:   Pain cannot be controlled with the prescribed medicine.  You have a fever or shaking chills.  The severity or intensity of pain increases over 18 hours and is not relieved by pain medicine.  You develop a new onset of abdominal pain.  You feel faint or pass out.  You are unable to urinate. MAKE SURE YOU:   Understand these instructions.  Will watch your condition.  Will get help right away if you are not doing well or get worse. Document Released: 11/29/2005 Document Revised: 08/01/2013 Document Reviewed: 05/02/2013 [facility] Patient Information 2015 [place], [place]. This information is not intended to replace advice given to you by your health care provider. Make sure you discuss any questions you have with your health care provider.

## 2014-12-30 NOTE — ED Notes (Signed)
Awake. Verbally responsive. A/O x4. Resp even and unlabored. No audible adventitious breath sounds noted. ABC's intact. NAD noted. 

## 2014-12-30 NOTE — ED Notes (Signed)
Awake. Verbally responsive. A/O x4. Resp even and unlabored. No audible adventitious breath sounds noted. ABC's intact.  

## 2014-12-31 LAB — URINE CULTURE
CULTURE: NO GROWTH
Colony Count: NO GROWTH

## 2015-01-29 ENCOUNTER — Other Ambulatory Visit: Payer: Self-pay | Admitting: Family Medicine

## 2015-01-29 DIAGNOSIS — R7989 Other specified abnormal findings of blood chemistry: Secondary | ICD-10-CM

## 2015-01-29 DIAGNOSIS — R945 Abnormal results of liver function studies: Principal | ICD-10-CM

## 2015-02-05 ENCOUNTER — Ambulatory Visit
Admission: RE | Admit: 2015-02-05 | Discharge: 2015-02-05 | Disposition: A | Discharge status: Home / Self Care | Payer: BLUE CROSS/BLUE SHIELD | Source: Ambulatory Visit | Attending: Family Medicine | Admitting: Family Medicine

## 2015-02-05 DIAGNOSIS — R945 Abnormal results of liver function studies: Principal | ICD-10-CM

## 2015-02-05 DIAGNOSIS — R7989 Other specified abnormal findings of blood chemistry: Secondary | ICD-10-CM

## 2016-05-14 ENCOUNTER — Other Ambulatory Visit: Payer: Self-pay | Admitting: Gastroenterology

## 2016-05-14 DIAGNOSIS — K760 Fatty (change of) liver, not elsewhere classified: Secondary | ICD-10-CM

## 2016-05-24 ENCOUNTER — Other Ambulatory Visit: Payer: BLUE CROSS/BLUE SHIELD

## 2016-05-31 ENCOUNTER — Ambulatory Visit
Admission: RE | Admit: 2016-05-31 | Discharge: 2016-05-31 | Disposition: A | Discharge status: Home / Self Care | Payer: BLUE CROSS/BLUE SHIELD | Source: Ambulatory Visit | Attending: Gastroenterology | Admitting: Gastroenterology

## 2016-05-31 DIAGNOSIS — K760 Fatty (change of) liver, not elsewhere classified: Secondary | ICD-10-CM

## 2016-05-31 IMAGING — US US ABDOMEN COMPLETE
1 series · 14 of 25 positions shown · non-contrast
Comparison: [DATE].

CLINICAL DATA: Fatty liver.

EXAM:
ABDOMEN ULTRASOUND COMPLETE

[Series 1: us abdomen complete · 0.30mm/px · 14 of 85 slices shown]
[im 1/85]
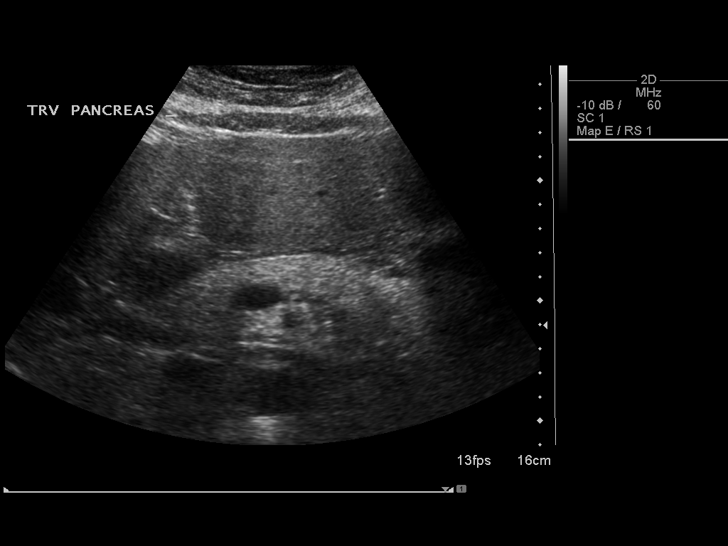
[im 8/85]
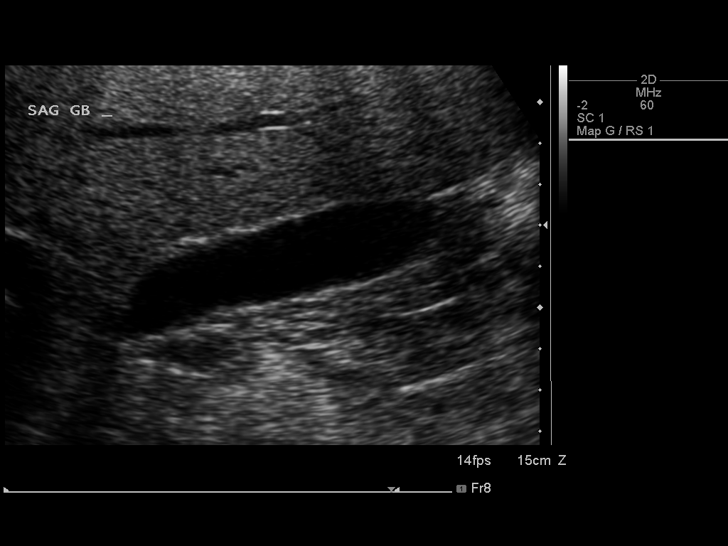
[im 15/85]
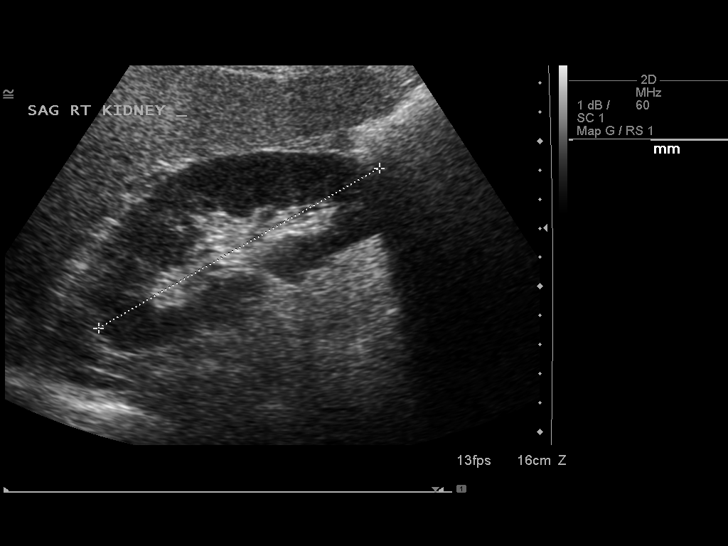
[im 22/85]
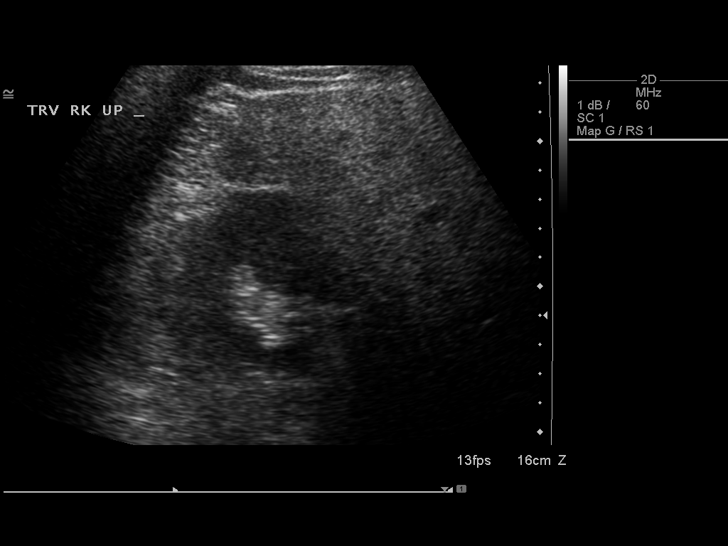
[im 29/85]
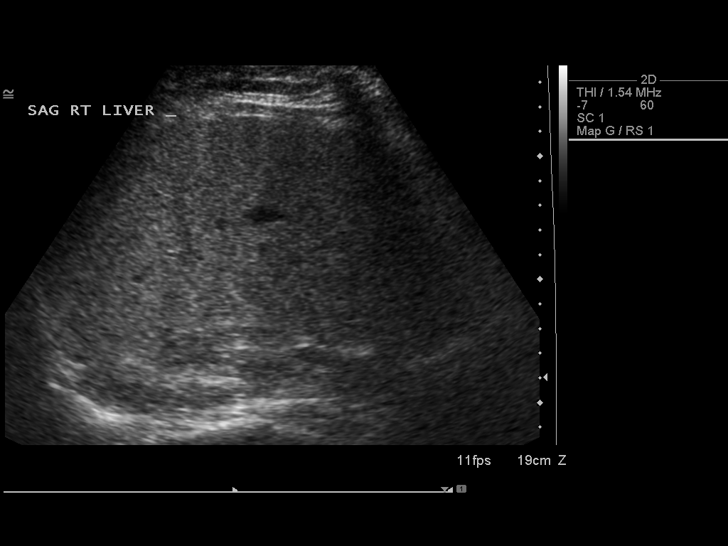
[im 32/85]
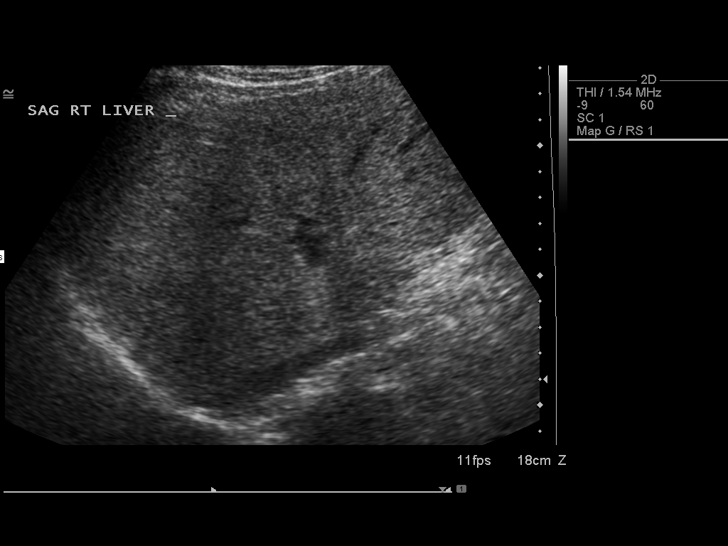
[im 39/85]
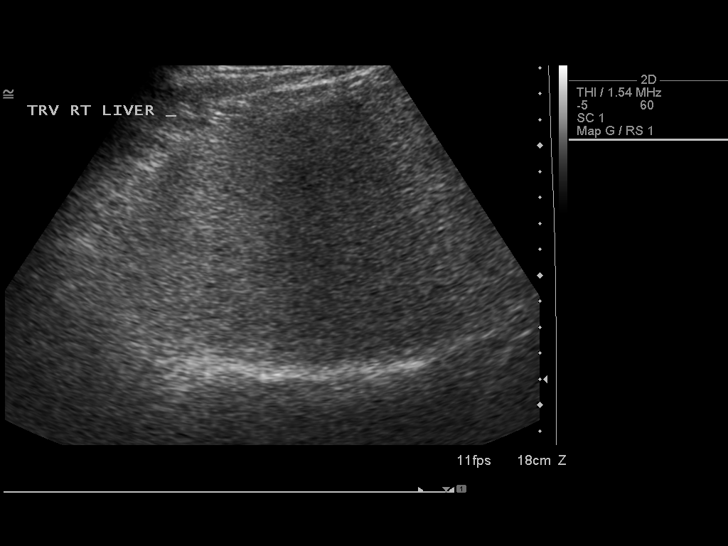
[im 46/85]
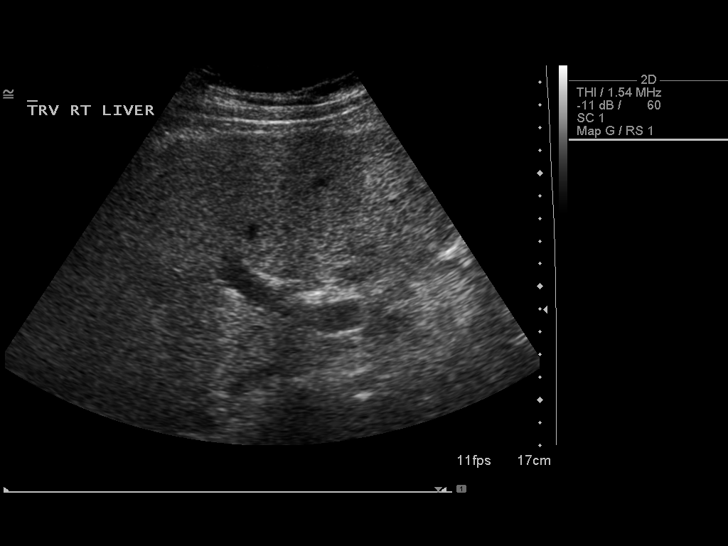
[im 53/85]
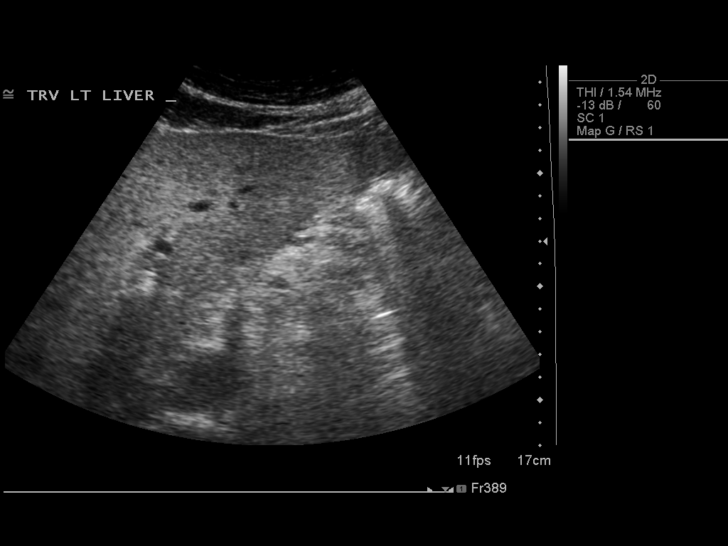
[im 57/85]
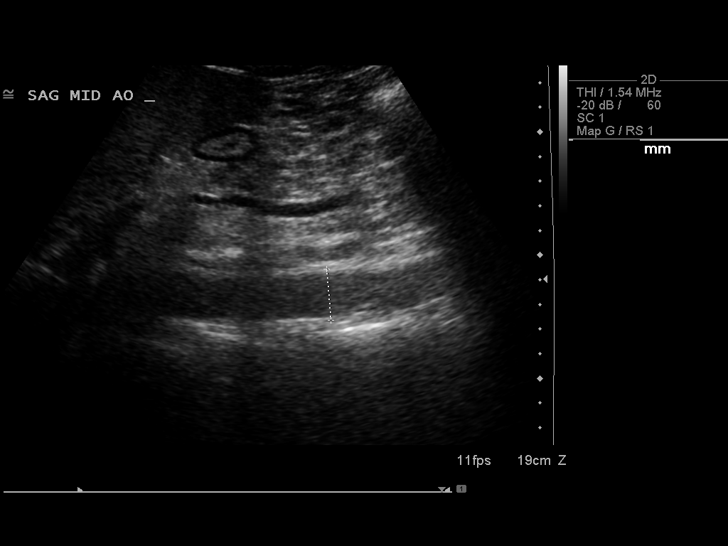
[im 64/85]
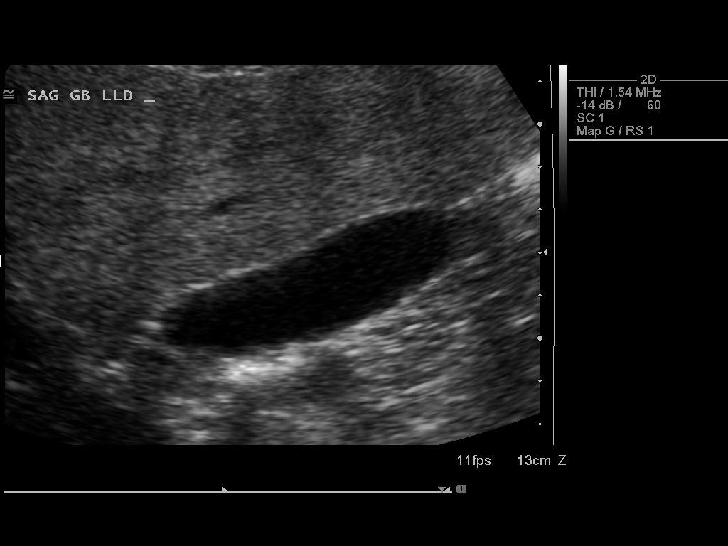
[im 71/85]
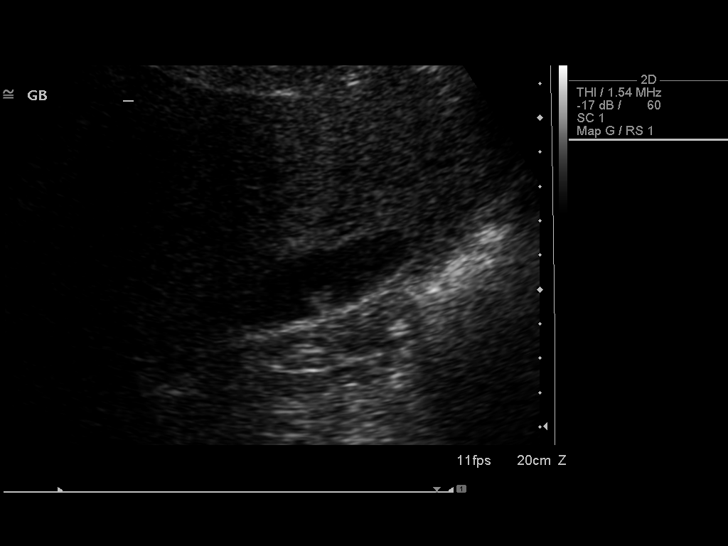
[im 78/85]
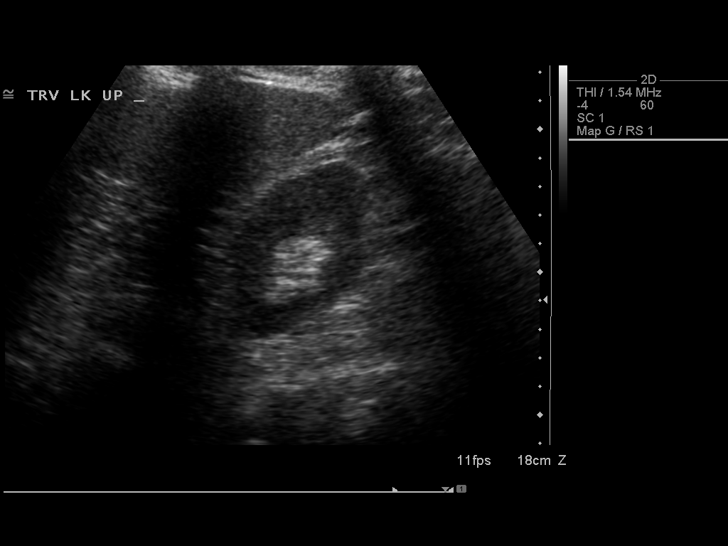
[im 85/85]
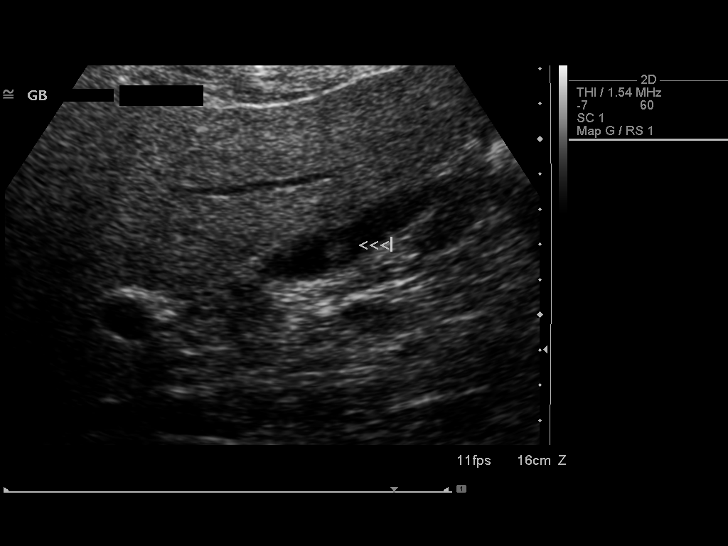

[14 of 25 positions shown; findings below may reference images not displayed]

FINDINGS: Gallbladder: 7 mm non mobile non shadowing echogenic focus in the
gallbladder consistent with polyp. Gallbladder wall thickness
normal.

Common bile duct: Diameter: 2.5 mm

Liver: The liver is echogenic consistent fatty infiltration. Tiny
area hypo echogenicity noted adjacent to the gallbladder fossa
consistent with focal fatty sparing again noted.

IVC: No abnormality visualized.

Pancreas: Visualized portion unremarkable.

Spleen: Spleen is prominent 14.0 cm.

Right Kidney: Length: 11.1 cm. Echogenicity within normal limits. No
mass or hydronephrosis visualized.

Left Kidney: Length: 12.3 cm. Echogenicity within normal limits. No
mass or hydronephrosis visualized.

Abdominal aorta: No aneurysm visualized.

Other findings: None.
IMPRESSION: 1. Tiny 7 mm gallbladder polyp. No gallstones or biliary distention.

2. Echogenic liver is again noted consistent fatty infiltration.
Tiny focus of focal fatty sparing again noted adjacent to the
gallbladder fossa.

3.  Prominent spleen at 14.0 cm.

## 2016-11-23 LAB — HM DIABETES EYE EXAM

## 2019-01-24 DIAGNOSIS — Z23 Encounter for immunization: Secondary | ICD-10-CM | POA: Diagnosis not present

## 2019-02-01 ENCOUNTER — Ambulatory Visit: Payer: BLUE CROSS/BLUE SHIELD | Admitting: Family Medicine

## 2019-02-01 ENCOUNTER — Telehealth: Payer: Self-pay | Admitting: Family Medicine

## 2019-02-01 ENCOUNTER — Encounter: Payer: Self-pay | Admitting: Family Medicine

## 2019-02-01 ENCOUNTER — Other Ambulatory Visit: Payer: Self-pay

## 2019-02-01 VITALS — BP 132/90 | HR 66 | Temp 98.8°F | Ht 67.0 in | Wt 208.0 lb

## 2019-02-01 DIAGNOSIS — E1169 Type 2 diabetes mellitus with other specified complication: Secondary | ICD-10-CM

## 2019-02-01 DIAGNOSIS — G43909 Migraine, unspecified, not intractable, without status migrainosus: Secondary | ICD-10-CM | POA: Insufficient documentation

## 2019-02-01 DIAGNOSIS — G43809 Other migraine, not intractable, without status migrainosus: Secondary | ICD-10-CM

## 2019-02-01 DIAGNOSIS — E119 Type 2 diabetes mellitus without complications: Secondary | ICD-10-CM | POA: Diagnosis not present

## 2019-02-01 DIAGNOSIS — E781 Pure hyperglyceridemia: Secondary | ICD-10-CM | POA: Insufficient documentation

## 2019-02-01 DIAGNOSIS — Z23 Encounter for immunization: Secondary | ICD-10-CM

## 2019-02-01 DIAGNOSIS — E785 Hyperlipidemia, unspecified: Secondary | ICD-10-CM

## 2019-02-01 DIAGNOSIS — H9311 Tinnitus, right ear: Secondary | ICD-10-CM

## 2019-02-01 DIAGNOSIS — G4733 Obstructive sleep apnea (adult) (pediatric): Secondary | ICD-10-CM | POA: Diagnosis not present

## 2019-02-01 DIAGNOSIS — H9193 Unspecified hearing loss, bilateral: Secondary | ICD-10-CM

## 2019-02-01 LAB — CBC
HEMATOCRIT: 46.5 % (ref 39.0–52.0)
Hemoglobin: 16.1 g/dL (ref 13.0–17.0)
MCHC: 34.6 g/dL (ref 30.0–36.0)
MCV: 88.5 fl (ref 78.0–100.0)
PLATELETS: 245 10*3/uL (ref 150.0–400.0)
RBC: 5.25 Mil/uL (ref 4.22–5.81)
RDW: 12.6 % (ref 11.5–15.5)
WBC: 5.7 10*3/uL (ref 4.0–10.5)

## 2019-02-01 LAB — LIPID PANEL
CHOLESTEROL: 222 mg/dL — AB (ref 0–200)
HDL: 31.1 mg/dL — ABNORMAL LOW (ref >39.00)
Total CHOL/HDL Ratio: 7
Triglycerides: 616 mg/dL — ABNORMAL HIGH (ref 0.0–149.0)

## 2019-02-01 LAB — COMPREHENSIVE METABOLIC PANEL
ALBUMIN: 4.5 g/dL (ref 3.5–5.2)
ALT: 30 U/L (ref 0–53)
AST: 11 U/L (ref 0–37)
Alkaline Phosphatase: 70 U/L (ref 39–117)
BUN: 13 mg/dL (ref 6–23)
CHLORIDE: 99 meq/L (ref 96–112)
CO2: 26 mEq/L (ref 19–32)
CREATININE: 0.78 mg/dL (ref 0.40–1.50)
Calcium: 9.6 mg/dL (ref 8.4–10.5)
GFR: 109.66 mL/min (ref >60.00)
Glucose, Bld: 370 mg/dL — ABNORMAL HIGH (ref 70–99)
Potassium: 4.2 mEq/L (ref 3.5–5.1)
Sodium: 135 mEq/L (ref 135–145)
Total Bilirubin: 0.6 mg/dL (ref 0.2–1.2)
Total Protein: 6.9 g/dL (ref 6.0–8.3)

## 2019-02-01 LAB — MICROALBUMIN / CREATININE URINE RATIO
Creatinine,U: 73.1 mg/dL
Microalb Creat Ratio: 1 mg/g (ref 0.0–30.0)
Microalb, Ur: <0.7 mg/dL (ref 0.0–1.9)

## 2019-02-01 LAB — TSH: TSH: 2.53 u[IU]/mL (ref 0.35–4.50)

## 2019-02-01 LAB — GLUCOSE, POCT (MANUAL RESULT ENTRY): POC Glucose: 378 mg/dl — AB (ref 70–99)

## 2019-02-01 LAB — POCT GLYCOSYLATED HEMOGLOBIN (HGB A1C): HEMOGLOBIN A1C: 14.1 % — AB (ref 4.0–5.6)

## 2019-02-01 LAB — LDL CHOLESTEROL, DIRECT: Direct LDL: 120 mg/dL

## 2019-02-01 MED ORDER — ACCU-CHEK SOFT TOUCH LANCETS MISC: 12 refills | Status: DC

## 2019-02-01 MED ORDER — METFORMIN HCL ER 500 MG PO TB24: 1000.0000 mg | ORAL_TABLET | Freq: Two times a day (BID) | ORAL | 0 refills | Status: DC

## 2019-02-01 MED ORDER — GLUCOSE BLOOD VI STRP: ORAL_STRIP | 6 refills | Status: DC

## 2019-02-01 NOTE — Progress Notes (Signed)
Chief Complaint:  Douglas Phillips is a 43 y.o. male who presents today with a chief complaint of T2DM and to establish care.   Assessment/Plan:  Tinnitus / Hearing loss Patient failed OAE testing today in office.  Will place referral to ENT for further evaluation.  Migraine Stable.  No red flags.  Will restart CPAP machine which will hopefully help for this.  Discussed reasons to return to care.  OSA (obstructive sleep apnea) Will place referral to sleep medicine.  Dyslipidemia associated with type 2 diabetes mellitus (HCC) Recheck lipid panel today.  Diabetes mellitus without complication (HCC) A1c significantly elevated 14.1.  Discussed treatment options. Deferred insulin for the time being.  Will restart metformin. Will quickly escalate to max dose 1000 mg twice daily.  Will check CBC, CMET, TSH, C-peptide, and urine microalbumin today.  Follow-up in 3 months, or sooner as needed.  Preventative Healthcare Tdap given today.     Subjective:  HPI:  Tinnitus Started a few weeks ago.  Located mostly in his right ear.  Symptoms come and go.  Associated with hearing loss and occasional dizziness.  No obvious precipitating events.  No weakness or numbness.  No headaches.  No nausea or vomiting.   T2DM  Several year history.  Most recent A1c was in the fives a couple of years ago.  He has not seen a primary care physician in the last few years and is stopped all of his medications.  He was previously on metformin and another medication he does not remember the name of.  He has had some weight loss recently in addition to significant polyuria polydipsia.  Dyslipidemia Also several year history.  Reportedly stopped his atorvastatin at the direction of his previous physician.  Has not had cholesterol checked in a few years.  Obstructive sleep apnea Had a sleep study done about 5 years ago.  Was on a CPAP machine.  Would like referral to have updated machine.   ROS: Per HPI,  otherwise a complete review of systems was negative.   PMH:  The following were reviewed and entered/updated in epic: Past Medical History:  Diagnosis Date  . Diabetes mellitus without complication (HCC)   . High cholesterol   . Hypertension   . Kidney stones   . Migraine   . MIGRAINE HEADACHE 04/05/2007   Qualifier: Diagnosis of  By: Drue Novel MD, Nolon Rod.    Patient Active Problem List   Diagnosis Date Noted  . Diabetes mellitus without complication (HCC) 02/01/2019  . Dyslipidemia associated with type 2 diabetes mellitus (HCC) 02/01/2019  . Migraine 02/01/2019  . OSA (obstructive sleep apnea) 01/22/2010  . NEPHROLITHIASIS, HX OF 04/05/2007   Past Surgical History:  Procedure Laterality Date  . BACK SURGERY     ruptured L4 and L5    Family History  Problem Relation Age of Onset  . Osteoarthritis Mother   . Liver cancer Maternal Grandmother   . Colon cancer Neg Hx   . Prostate cancer Neg Hx     Medications- reviewed and updated Current Outpatient Medications  Medication Sig Dispense Refill  . metFORMIN (GLUCOPHAGE-XR) 500 MG 24 hr tablet Take 2 tablets (1,000 mg total) by mouth 2 (two) times daily. 360 tablet 0   No current facility-administered medications for this visit.     Allergies-reviewed and updated No Known Allergies  Social History   Socioeconomic History  . Marital status: Married    Spouse name: Not on file  . Number of children: Not  on file  . Years of education: Not on file  . Highest education level: Not on file  Occupational History  . Not on file  Social Needs  . Financial resource strain: Not on file  . Food insecurity:    Worry: Not on file    Inability: Not on file  . Transportation needs:    Medical: Not on file    Non-medical: Not on file  Tobacco Use  . Smoking status: Never Smoker  . Smokeless tobacco: Never Used  Substance and Sexual Activity  . Alcohol use: No  . Drug use: No  . Sexual activity: Not on file  Lifestyle  .  Physical activity:    Days per week: Not on file    Minutes per session: Not on file  . Stress: Not on file  Relationships  . Social connections:    Talks on phone: Not on file    Gets together: Not on file    Attends religious service: Not on file    Active member of club or organization: Not on file    Attends meetings of clubs or organizations: Not on file    Relationship status: Not on file  Other Topics Concern  . Not on file  Social History Narrative  . Not on file        Objective:  Physical Exam: BP 132/90 (BP Location: Left Arm, Patient Position: Sitting, Cuff Size: Large)   Pulse 66   Temp 98.8 F (37.1 C) (Oral)   Ht 5\' 7"  (1.702 m)   Wt 208 lb (94.3 kg)   SpO2 97%   BMI 32.58 kg/m   Gen: NAD, resting comfortably HEENT: TMs clear bilaterally. CV: Regular rate and rhythm with no murmurs appreciated Pulm: Normal work of breathing, clear to auscultation bilaterally with no crackles, wheezes, or rhonchi GI: Normal bowel sounds present. Soft, Nontender, Nondistended. MSK: No edema, cyanosis, or clubbing noted Skin: Warm, dry Neuro: Grossly normal, moves all extremities Psych: Normal affect and thought content       M. [name], MD 02/01/2019 9:14 AM

## 2019-02-01 NOTE — Patient Instructions (Signed)
It was very nice to see you today!  Your blood sugars are high.  Please start metformin.  Take 1000 mg every morning for 1 to 2 weeks.  Please then increase to 1000 mg twice daily.  Come back to see me in 3 months to recheck your A1c.  We will check blood work today.  I will place a referral for you to see a sleep specialist.  I also place referral for you to see an ENT.  Come back in 3 months, or sooner as needed.   Take care, Dr [name]

## 2019-02-01 NOTE — Assessment & Plan Note (Signed)
Will place referral to sleep medicine. 

## 2019-02-01 NOTE — Telephone Encounter (Signed)
Spoke with patient,glucometer left up front. Patient aware.

## 2019-02-01 NOTE — Telephone Encounter (Signed)
Pt requesting a new glucometer, test strips and lancets.    See attaached note.   Pt of Dr. [name].

## 2019-02-01 NOTE — Assessment & Plan Note (Signed)
-   Recheck lipid panel today

## 2019-02-01 NOTE — Assessment & Plan Note (Signed)
Stable.  No red flags.  Will restart CPAP machine which will hopefully help for this.  Discussed reasons to return to care.

## 2019-02-01 NOTE — Telephone Encounter (Signed)
See note

## 2019-02-01 NOTE — Assessment & Plan Note (Signed)
A1c significantly elevated 14.1.  Discussed treatment options. Deferred insulin for the time being.  Will restart metformin. Will quickly escalate to max dose 1000 mg twice daily.  Will check CBC, CMET, TSH, C-peptide, and urine microalbumin today.  Follow-up in 3 months, or sooner as needed.

## 2019-02-01 NOTE — Telephone Encounter (Signed)
Copied from CRM [id number]. Topic: Quick Communication - See Telephone Encounter >> Feb 01, 2019 10:26 AM Angela Nevin wrote: CRM for notification. See Telephone encounter for: 02/01/19.  Patient called requesting a new glucometer, test strips and lancets. Patient states that he cannot remember which he used previously or frequency. Patient is out of supplies. Please advise.    Pharmacy:[id number] [place], [place] - [address] AT [facility] [address] [phone] (Phone) [phone] (Fax)

## 2019-02-01 NOTE — Addendum Note (Signed)
Addended by: Erick Alley on: 02/01/2019 09:25 AM   Modules accepted: Orders

## 2019-02-02 LAB — C-PEPTIDE: C-Peptide: 1.61 ng/mL (ref 0.80–3.85)

## 2019-02-05 ENCOUNTER — Other Ambulatory Visit: Payer: Self-pay

## 2019-02-05 ENCOUNTER — Telehealth: Payer: Self-pay | Admitting: Family Medicine

## 2019-02-05 MED ORDER — ATORVASTATIN CALCIUM 40 MG PO TABS: 40.0000 mg | ORAL_TABLET | Freq: Every day | ORAL | 3 refills | Status: DC

## 2019-02-05 NOTE — Telephone Encounter (Signed)
Copied from CRM [id number]. Topic: General - Other >> Feb 05, 2019  1:52 PM Gean Birchwood R wrote: Patient states he received a call for his lab work, no notes documented to give results

## 2019-02-05 NOTE — Progress Notes (Signed)
Please inform patient of the following:  His cholesterol and blood sugar levels are elevated, but everything else looks good. Would like for him to continue with plan for his metformin as we discussed.  Would like to start lipitor 40mg  daily to improve his triglyceride and cholesterol numbers and reduce risk of heart attack and stroke.  Would like for him to come back in 3 months to recheck A1c.  Katina Degree. [name], MD 02/05/2019 8:53 AM

## 2019-02-06 NOTE — Telephone Encounter (Signed)
See result note.  

## 2019-02-07 DIAGNOSIS — H6901 Patulous Eustachian tube, right ear: Secondary | ICD-10-CM | POA: Diagnosis not present

## 2019-02-07 DIAGNOSIS — H6521 Chronic serous otitis media, right ear: Secondary | ICD-10-CM | POA: Diagnosis not present

## 2019-02-07 DIAGNOSIS — H9042 Sensorineural hearing loss, unilateral, left ear, with unrestricted hearing on the contralateral side: Secondary | ICD-10-CM | POA: Diagnosis not present

## 2019-02-07 DIAGNOSIS — H9201 Otalgia, right ear: Secondary | ICD-10-CM | POA: Diagnosis not present

## 2019-02-07 DIAGNOSIS — H9041 Sensorineural hearing loss, unilateral, right ear, with unrestricted hearing on the contralateral side: Secondary | ICD-10-CM | POA: Diagnosis not present

## 2019-02-07 DIAGNOSIS — H9311 Tinnitus, right ear: Secondary | ICD-10-CM | POA: Diagnosis not present

## 2019-02-20 ENCOUNTER — Encounter: Payer: Self-pay | Admitting: Family Medicine

## 2019-03-07 ENCOUNTER — Telehealth: Payer: Self-pay | Admitting: Neurology

## 2019-03-07 ENCOUNTER — Encounter: Payer: Self-pay | Admitting: Neurology

## 2019-03-07 NOTE — Telephone Encounter (Signed)
Called the patient to inform them that our office has placed new protocols in place for our office visits. Due to the virus pandemic our office is reducing our number of office visits in order to minimize the risk to our patients and healthcare providers. Advised that our office is now providing the capability to offer the patients phone visits at this time. Informed of what that process looks like and informed that the telephone office visit will still be billed through insurance and due to [place] we need them to know since the appointment is taking place over the phone, we can't guarantee the security of the phone line. With that said if we do move forward I would have to get verbal consent to completed the call over the phone. Pt verbalized understanding and gave verbal consent to do the video consult. Reviewed the patient's chart, medications, pharmacy, allergies and H&P.  Informed the patient about downloading the webex app. Informed him I will send a email to his address [email or web address]. pt number on file [phone] is the number to call if the webex doesn't work. Pt verbalized understanding of being ready by his phone at the time of his apt to join the meeting.

## 2019-03-08 DIAGNOSIS — H9311 Tinnitus, right ear: Secondary | ICD-10-CM | POA: Diagnosis not present

## 2019-03-08 DIAGNOSIS — H903 Sensorineural hearing loss, bilateral: Secondary | ICD-10-CM | POA: Diagnosis not present

## 2019-03-12 ENCOUNTER — Other Ambulatory Visit: Payer: Self-pay

## 2019-03-12 ENCOUNTER — Ambulatory Visit (INDEPENDENT_AMBULATORY_CARE_PROVIDER_SITE_OTHER): Payer: BLUE CROSS/BLUE SHIELD | Admitting: Neurology

## 2019-03-12 ENCOUNTER — Encounter: Payer: Self-pay | Admitting: Neurology

## 2019-03-12 DIAGNOSIS — G478 Other sleep disorders: Secondary | ICD-10-CM

## 2019-03-12 DIAGNOSIS — M2619 Other specified anomalies of jaw-cranial base relationship: Secondary | ICD-10-CM

## 2019-03-12 DIAGNOSIS — G4733 Obstructive sleep apnea (adult) (pediatric): Secondary | ICD-10-CM

## 2019-03-12 DIAGNOSIS — E119 Type 2 diabetes mellitus without complications: Secondary | ICD-10-CM

## 2019-03-12 DIAGNOSIS — R634 Abnormal weight loss: Secondary | ICD-10-CM | POA: Diagnosis not present

## 2019-03-12 DIAGNOSIS — Z9989 Dependence on other enabling machines and devices: Secondary | ICD-10-CM | POA: Insufficient documentation

## 2019-03-12 HISTORY — DX: Other sleep disorders: G47.8

## 2019-03-12 NOTE — Patient Instructions (Signed)
Home sleep test ordered- RV 60-90 days.

## 2019-03-12 NOTE — Progress Notes (Signed)
Virtual Visit via Video Note  I connected with Douglas Phillips on 03/12/19 at 11:30 AM EDT by a video enabled telemedicine application and verified that I am speaking with the correct person using two identifiers.   I discussed the limitations of evaluation and management by telemedicine and the availability of in person appointments. The patient expressed understanding and agreed to proceed.     SLEEP MEDICINE CLINIC   Provider:  Melvyn Novas, M D  Primary Care Physician:  Ardith Dark, MD   Referring Provider: Ardith Dark, MD    HPI:  Douglas Phillips is a 43 y.o. male , seen here in a virtual visit  from Dr. [name] for a re- evaluation of sleep apnea.   Douglas Phillips has been an experienced CPAP user after he received a diagnosis of obstructive sleep apnea about 15 years ago he is now on either his second or third CPAP machine at the current one is about 41 years old and ready to be replaced.  The patient has a medical history of diabetes mellitus with nocturia before he was treated with metformin.  He also has frequent sinus congestion, allergic rhinitis, and he used to have chronic migraines which have meanwhile resolved. He has a history of back surgery, kidney stones, HTN.   He was considered morbidly obese but lost over 60 pounds in the last 18 months- however this has not changed his dependency on CPAP.   While we are not sure about his current baseline AHI the patient reports that he is unable to initiate sleep without CPAP.  He named an example of his CPAP dependency during a power outage when he was  staying overnight in a hotel.  He was unable to get sleep without CPAP. He is in need of replacement, uses an S 9 machine and P10 nasal pillow in medium size.    Sleep habits are as follows: The patient reports that dinnertime is usually between 630 and 7 PM bedtime varies greater between 9 and 11.  He shares a bedroom with his spouse and Douglas Phillips prefers to have  the TV on in the background.  While the bedroom is cool, it is neither quiet nor dark. The TV is usually on a timer and will switch off within 60 minutes or so.  The patient stated that he prefers to sleep on his side used to be sleeping prone until he was prescribed CPAP therapy.  He is using 2 pillows for head and neck support and sleeps on a flat bed.  He has observed that if the TV is not switching off by itself his sleep is fragmented and less restorative.  After achieving better diabetis control, he has been able to sleep through the night without nocturia and averages 7.5 hours of sleep each night.  He often dreams but he does not and act dreams, he rises in the morning at about 6:30 AM but he no longer feels as refreshed as he initially did.  However he was very clear about his sleep quality still being much better than before CPAP.  He endorsed the Epworth sleepiness score at 11 out of 24 points.   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  He is married and has a 17 year old son. He drinks 2 diet Mountain Dew's a day 1 in the morning 1 after lunch and denies any use of tea or coffee.  He is a lifelong  non-smoker and does not use any other form of tobacco.  He considers himself a night owl but was never a nocturnal shift worker.  He seldomly drinks alcohol less than 1 glass/month.  Review of Systems: Out of a complete 14 system review, the patient complains of only the following symptoms, and all other reviewed systems are negative.  Douglas Phillips endorsed the Epworth sleepiness score at 11 out of 24 points, he occasionally feels fatigued, but he is not suffering from chronic fatigue.  His sleep has been less restorative and less refreshing, he denies any aching pains, he does no longer have nocturia, no longer has migraines, there is no history of chest pain, vertigo and he does not wake up with headaches.  How likely  are you to doze in the following situations: 0 = not likely, 1 = slight chance, 2 = moderate chance, 3 = high chance  Sitting and Reading? Watching Television? Sitting inactive in a public place (theater or meeting)? Lying down in the afternoon when circumstances permit? Sitting and talking to someone? Sitting quietly after lunch without alcohol? In a car, while stopped for a few minutes in traffic? As a passenger in a car for an hour without a break?  Total Epworth score 11/24  Social History   Socioeconomic History   Marital status: Married    Spouse name: Not on file   Number of children: Not on file   Years of education: Not on file   Highest education level: Not on file  Occupational History   Not on file  Social Needs   Financial resource strain: Not on file   Food insecurity:    Worry: Not on file    Inability: Not on file   Transportation needs:    Medical: Not on file    Non-medical: Not on file  Tobacco Use   Smoking status: Never Smoker   Smokeless tobacco: Never Used  Substance and Sexual Activity   Alcohol use: No   Drug use: No   Sexual activity: Not on file  Lifestyle   Physical activity:    Days per week: Not on file    Minutes per session: Not on file   Stress: Not on file  Relationships   Social connections:    Talks on phone: Not on file    Gets together: Not on file    Attends religious service: Not on file    Active member of club or organization: Not on file    Attends meetings of clubs or organizations: Not on file    Relationship status: Not on file   Intimate partner violence:    Fear of current or ex partner: Not on file    Emotionally abused: Not on file    Physically abused: Not on file    Forced sexual activity: Not on file  Other Topics Concern   Not on file  Social History Narrative   Not on file    Family History  Problem Relation Age of Onset   Osteoarthritis Mother    Hypertension Father     Liver cancer Maternal Grandmother    Hypertension Paternal Grandfather    Heart attack Paternal Grandfather    Colon cancer Neg Hx    Prostate cancer Neg Hx     Past Medical History:  Diagnosis Date   Diabetes mellitus without complication (HCC)    High cholesterol    Hypertension    Kidney stones    Migraine    MIGRAINE  HEADACHE 04/05/2007   Qualifier: Diagnosis of  By: Drue Novel MD, Nolon Rod.     Past Surgical History:  Procedure Laterality Date   BACK SURGERY     ruptured L4 and L5   MOUTH SURGERY      Current Outpatient Medications  Medication Sig Dispense Refill   atorvastatin (LIPITOR) 40 MG tablet Take 1 tablet (40 mg total) by mouth daily. 90 tablet 3   glucose blood test strip Use as instructed Check glucose blood sugars once daily 100 each 6   Lancets (ACCU-CHEK SOFT TOUCH) lancets Use as instructed 100 each 12   metFORMIN (GLUCOPHAGE-XR) 500 MG 24 hr tablet Take 2 tablets (1,000 mg total) by mouth 2 (two) times daily. 360 tablet 0   No current facility-administered medications for this visit.     Allergies as of 03/12/2019   (No Known Allergies)    Vitals: There were no vitals taken for this visit. Last Weight:  Wt Readings from Last 1 Encounters:  02/01/19 208 lb (94.3 kg)   OAC:ZYSAY is no height or weight on file to calculate BMI.       Last Height:   Ht Readings from Last 1 Encounters:  02/01/19  (1.702 m)    Physical exam:  General: The patient is awake, alert and appears not in acute distress. The patient is well groomed. Facial hair is present, but well trimmed. Head: Normocephalic, atraumatic.  Mallampati3,  neck circumference:17". Nasal airflow patent , Retrognathia is noted.    Neurologic exam : The patient is awake and alert, oriented to place and time.   Attention span & concentration ability appears normal.  Speech is fluent,  without  dysarthria, dysphonia or aphasia.  Mood and affect are appropriate.  Cranial  nerves: Pupils are equal and briskly reactive to light.  Extraocular movements  in vertical and horizontal planes intact and without nystagmus.   Facial sensation reportedly intact to fine touch.Facial motor strength is symmetric and tongue and uvula move midline.Shoulder shrug was symmetrical.   A detailed muscle strength examination by motor groups was not possible.  I also deferred gait and balance and coordination evaluation.  Dear Dr. [name],  I would like to thank you for sending Duron Meister. Saran to my sleep clinic.  As you know he has a quite significant medical history for his young age, and he is using CPAP apparently compliant for over 15 years now.  In order to obtain a new CPAP machine hat is state-of-the-art, I will need to document that the patient still has obstructive sleep apnea. Due to the current coronavirus crisis I am unable to invite this patient into my sleep lab.  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  The home sleep test will be mailed to his house and he was asked to attach the wrist band to his nondominant hand, places ring finger into the device, attention electrodes to his chest and we will be able to retrieve the data from the cloud.  He agreed to use this method of evaluation, and I assured him that I should be able to write a prescription for an auto titration capable CPAP machine after we confirmed the presence of obstructive sleep apnea.  Since he is so familiar with CPAP I do not expect any compliance problems.  It may be more difficult to actually obtain reliable sleep data in  a patient who will not be able to use his CPAP during the night of the test.  We will follow-up hopefully face-to-face after 90 days.   Plan: 42 year old male patient with DM, hyperlipidemia, retrognathia , allergic rhinitis, OSA on CPAP.   A HST was ordered. Once OSA is  confirmed ( since weight loss, his DM control, HTN control all have changed), I will order an autotitration CPAP machine.  The patient hd no further questions.    Melvyn Novas, MD 03/12/2019, 12:03 PM  Certified in Neurology by ABPN Certified in Sleep Medicine by [facility] [address] [place], [place] [address]                                 History of Present Illness:    Observations/Objective:   Assessment and Plan:   Follow Up Instructions:    I discussed the assessment and treatment plan with the patient. The patient was provided an opportunity to ask questions and all were answered. The patient agreed with the plan and demonstrated an understanding of the instructions.   The patient was advised to call back or seek an in-person evaluation if the symptoms worsen or if the condition fails to improve as anticipated.  I provided 30 minutes of non-face-to-face time during this encounter.   Melvyn Novas, MD

## 2019-03-19 ENCOUNTER — Other Ambulatory Visit: Payer: Self-pay

## 2019-03-19 ENCOUNTER — Ambulatory Visit (INDEPENDENT_AMBULATORY_CARE_PROVIDER_SITE_OTHER): Payer: BLUE CROSS/BLUE SHIELD | Admitting: Neurology

## 2019-03-19 DIAGNOSIS — M2619 Other specified anomalies of jaw-cranial base relationship: Secondary | ICD-10-CM

## 2019-03-19 DIAGNOSIS — Z9989 Dependence on other enabling machines and devices: Principal | ICD-10-CM

## 2019-03-19 DIAGNOSIS — G4733 Obstructive sleep apnea (adult) (pediatric): Secondary | ICD-10-CM

## 2019-03-19 DIAGNOSIS — E119 Type 2 diabetes mellitus without complications: Secondary | ICD-10-CM

## 2019-03-19 DIAGNOSIS — R634 Abnormal weight loss: Secondary | ICD-10-CM

## 2019-03-19 DIAGNOSIS — G478 Other sleep disorders: Secondary | ICD-10-CM

## 2019-04-03 NOTE — Telephone Encounter (Signed)
Pt calling in wanting to know his sleep results and when he will get a machine.

## 2019-04-03 NOTE — Telephone Encounter (Signed)
As of this moment Dr Vickey Huger has not read his sleep study yet. We are running a little behind due to Dr Vickey Huger was on vacation and we had Tree surgeon. I have made Dr Dohmeier aware of the pt calling about this and she will read and I will contact him as soon as I ge the results

## 2019-04-04 ENCOUNTER — Telehealth: Payer: Self-pay | Admitting: Neurology

## 2019-04-04 NOTE — Procedures (Signed)
Patient Information     First Name: Douglas Last Name: Phillips ID: [id number]  Birth Date: 09-Sep-1976 Age: 43 Gender: Male  Referring Doctor: Ardith Dark, MD BMI: 32.9 (W=209 lb, H=5' 7'')  Reading Doctor: Neck Circ.:  Melvyn Novas, MD  17 '' Epworth:  11/24                                Sleep Study Information  Study Date: Mar 22, 2019 S/H/A Version: [phone] / 4.1.1531 / 80   History:  Douglas Phillips has been an experienced CPAP user after he received a diagnosis of obstructive sleep apnea about 15 years ago. He is now on either his second or third CPAP machine at the current one is about 41 years old and ready to be replaced. He is in need of replacement, uses an S 9 machine and P10 nasal pillow in medium size The patient has a medical history of diabetes mellitus with nocturia before he was treated with metformin.  He also has frequent sinus congestion, allergic rhinitis, and he used to have chronic migraines which have meanwhile resolved. He has a history of back surgery, kidney stones, HTN.   He was considered morbidly obese but lost over 60 pounds in the last 18 months- however this has not changed his dependency on CPAP.   While we are not sure about his current baseline AHI the patient reports that he is unable to initiate sleep without CPAP.  He named an example of his CPAP dependency during a power outage when he was staying overnight in a hotel.  He was unable to get sleep without CPAP.    Summary & Diagnosis:    Only mild sleep apnea was documented in this HST- at an AHI of 11.2/h- and exacerbated in supine sleep position, but not during REM sleep. RDI at 17.6/h indicates loud snoring. No prolonged hypoxemia was noted. .   Recommendations:      I would support continuous CPAP care for this patient with again confirmed OSA. Avoiding supine sleep will also help. to reduce apnea.  The patient will be issued a CPAP autotitration device with a setting of 5-15 cm water, 3 cm  EPR and mask of his choice.  Heated humidity will be provided.  Electronically Signed: Melvyn Novas, MD   04-03-2019         Sleep Summary  Oxygen Saturation Statistics   Start Study Time: End Study Time: Total Recording Time:  12:40:49AM 7:36:27 AM 6 h,  Total Sleep Time % REM of Sleep Time:  6 h, 9 min  26.9 %    Mean:      95 %          Minimum:  89 %              Maximum: 99%  Mean of Desaturations Nadirs (%): 92%  Oxygen Desaturation %: 4-9 10-20 >20 Total  Events Number Total  23 100.0  0 0.0  0 0.0  23 100.0  Oxygen Saturation: <90 <=88 <85 <80 <70  Duration (minutes): Sleep % 0.1 0.0 0.0 0.0 0.0 0.0 0.0 0.0 0.0 0.0     Respiratory Indices      Total Events REM NREM All Night/h  pRDI:  108  pAHI:  68 ODI:  23  pAHIc:  7  % CSR: 0.0 12.1 12.1 3.6 1.4 19.6 10.7 3.8 1.3 17.6 11.1 3.7  1.4       Pulse Rate Statistics during Sleep (BPM)        Mean: 63 Minimum: 37 Maximum: 89    hrs, 6 min. pRDI/pAHI are calculated using oxi desaturations ? 3% Body Position Statistics  Position Supine Prone Right Left Non-Supine  Sleep (min) 257.1 74.5 36.0 2.0 112.5  Sleep % 69.6 20.2 9.7 0.5 30.4  pRDI 20.8 11.3 6.7 N/A 10.1  pAHI 15.2 1.6 1.7 N/A 1.6  ODI 5.4 0.0 0.0 N/A 0.0     Snoring Statistics Snoring Level (dB) >40 >50 >60 >70 >80 >Threshold (45)  Sleep (min) 8.9 3.7 0.6 0.0 0.0 5.0  Sleep % 2.4 1.0 0.2 0.0 0.0 1.3    Mean: 40 dB Sleep Stages Chart                 pAHI=11.1                             Mild              Moderate                    Severe                                                 5

## 2019-04-04 NOTE — Addendum Note (Signed)
Addended by: Melvyn Novas on: 04/04/2019 09:35 AM   Modules accepted: Orders

## 2019-04-04 NOTE — Telephone Encounter (Signed)
Called patient to discuss sleep study results. No answer at this time. LVM for the patient to call back.   

## 2019-04-04 NOTE — Telephone Encounter (Signed)
I called pt. I advised pt that Dr. Vickey Huger reviewed their sleep study results and found that pt has sleep apnea. Dr. Vickey Huger recommends that pt starts auto CPAP. I reviewed PAP compliance expectations with the pt. Pt is agreeable to starting a CPAP. I advised pt that an order will be sent to a DME, [facility], and [facility] will call the pt within about one week after they file with the pt's insurance. [facility] will show the pt how to use the machine, fit for masks, and troubleshoot the CPAP if needed. A follow up appt was made for insurance purposes with Dr. Vickey Huger on 06/19/19 at 8:30 am. Pt verbalized understanding to arrive 15 minutes early and bring their CPAP. A letter with all of this information in it will be mailed to the pt as a reminder. I verified with the pt that the address we have on file is correct. Pt verbalized understanding of results. Pt had no questions at this time but was encouraged to call back if questions arise. I have sent the order to [facility] and have received confirmation that they have received the order.

## 2019-04-04 NOTE — Telephone Encounter (Signed)
-----   Message from Melvyn Novas, MD sent at 04/04/2019  9:35 AM EDT ----- I would support continuous CPAP care for this patient with again confirmed OSA. Avoiding supine sleep will also help to reduce apnea.  The patient will be issued a CPAP autotitration device with a setting of 5-15 cm water, 3 cm EPR and mask of his choice.  Heated humidity will be provided.

## 2019-04-26 DIAGNOSIS — G4733 Obstructive sleep apnea (adult) (pediatric): Secondary | ICD-10-CM | POA: Diagnosis not present

## 2019-05-01 ENCOUNTER — Ambulatory Visit: Payer: BLUE CROSS/BLUE SHIELD | Admitting: Family Medicine

## 2019-05-02 ENCOUNTER — Encounter: Payer: Self-pay | Admitting: Family Medicine

## 2019-05-03 ENCOUNTER — Ambulatory Visit (INDEPENDENT_AMBULATORY_CARE_PROVIDER_SITE_OTHER): Payer: BLUE CROSS/BLUE SHIELD | Admitting: Family Medicine

## 2019-05-03 VITALS — Ht 67.0 in

## 2019-05-03 DIAGNOSIS — E119 Type 2 diabetes mellitus without complications: Secondary | ICD-10-CM | POA: Diagnosis not present

## 2019-05-03 DIAGNOSIS — E785 Hyperlipidemia, unspecified: Secondary | ICD-10-CM | POA: Diagnosis not present

## 2019-05-03 DIAGNOSIS — E1169 Type 2 diabetes mellitus with other specified complication: Secondary | ICD-10-CM

## 2019-05-03 MED ORDER — SEMAGLUTIDE(0.25 OR 0.5MG/DOS) 2 MG/1.5ML ~~LOC~~ SOPN: 0.2500 mg | PEN_INJECTOR | SUBCUTANEOUS | 3 refills | Status: DC

## 2019-05-03 NOTE — Assessment & Plan Note (Signed)
Reported blood sugars still well above goal.  He will come into the office for A1c.  Will stop metformin due to side effects for the time being.  Start Ozempic 0.25 mg weekly.  Follow-up in 3 months to recheck A1c.  Advised patient that we should come back to Metformin at some point in the near future due to beneficial cardiovascular effects.

## 2019-05-03 NOTE — Assessment & Plan Note (Signed)
Continue Lipitor 40 mg daily.  Recheck lipid panel in 6 to 12 months.

## 2019-05-03 NOTE — Progress Notes (Signed)
    Chief Complaint:  Douglas Phillips is a 43 y.o. male who presents today for a virtual office visit with a chief complaint of T2DM follow up.   Assessment/Plan:  Dyslipidemia associated with type 2 diabetes mellitus (HCC) Continue Lipitor 40 mg daily.  Recheck lipid panel in 6 to 12 months.  Diabetes mellitus without complication (HCC) Reported blood sugars still well above goal.  He will come into the office for A1c.  Will stop metformin due to side effects for the time being.  Start Ozempic 0.25 mg weekly.  Follow-up in 3 months to recheck A1c.  Advised patient that we should come back to Metformin at some point in the near future due to beneficial cardiovascular effects.    Subjective:  HPI:  # T2DM - On metformin 1000mg  twice daily.  He was started on this 3 months ago.  Has noticed significant stomach pain and gas on higher dose.  Did not have significant side effects on lower dose.  He is not sure if the medication is helping with his sugars.  He has been working on diet and exercise which seem to have helped.  Overall her sugars seem to be stable however he would like to make a switch in medication due to side effects. - Home Blood sugars 200s-300s  # Dyslipidemia - On lipitor 40mg  daily.  This was started 3 months ago.  He is currently tolerating well. - ROS: No reported myalgias.  ROS: Per HPI  PMH: He reports that he has never smoked. He has never used smokeless tobacco. He reports that he does not drink alcohol or use drugs.      Objective/Observations  Physical Exam: Gen: NAD, resting comfortably Pulm: Normal work of breathing Psych: Normal affect and thought content  Virtual Visit via Video   I connected with Douglas Phillips on 05/03/19 at  8:20 AM EDT by a video enabled telemedicine application and verified that I am speaking with the correct person using two identifiers. I discussed the limitations of evaluation and management by telemedicine and the  availability of in person appointments. The patient expressed understanding and agreed to proceed.   Patient location: Home Provider location: [place] [employer] Persons participating in the virtual visit: Myself and Patient     Katina Degree. [name], MD 05/03/2019 8:48 AM

## 2019-05-08 ENCOUNTER — Ambulatory Visit: Payer: BLUE CROSS/BLUE SHIELD | Admitting: Family Medicine

## 2019-05-08 ENCOUNTER — Other Ambulatory Visit: Payer: Self-pay

## 2019-05-08 DIAGNOSIS — E119 Type 2 diabetes mellitus without complications: Secondary | ICD-10-CM | POA: Diagnosis not present

## 2019-05-08 LAB — POCT GLYCOSYLATED HEMOGLOBIN (HGB A1C): Hemoglobin A1C: 11.3 % — AB (ref 4.0–5.6)

## 2019-05-08 NOTE — Progress Notes (Signed)
Please inform patient of the following:  A1c better than last time but still well above goal. Would like for him to start ozempic as we discussed and come back in 91 days to recheck A1c.  Douglas Phillips. [name], MD 05/08/2019 9:03 AM

## 2019-05-08 NOTE — Progress Notes (Signed)
Pt here today for A1c check and to start ozempic. He was not seen or evaluated by me. Please see result note section.

## 2019-05-27 DIAGNOSIS — G4733 Obstructive sleep apnea (adult) (pediatric): Secondary | ICD-10-CM | POA: Diagnosis not present

## 2019-06-19 ENCOUNTER — Encounter: Payer: Self-pay | Admitting: Family Medicine

## 2019-06-19 ENCOUNTER — Telehealth (INDEPENDENT_AMBULATORY_CARE_PROVIDER_SITE_OTHER): Payer: BC Managed Care – PPO | Admitting: Family Medicine

## 2019-06-19 DIAGNOSIS — Z9989 Dependence on other enabling machines and devices: Secondary | ICD-10-CM | POA: Diagnosis not present

## 2019-06-19 DIAGNOSIS — G4733 Obstructive sleep apnea (adult) (pediatric): Secondary | ICD-10-CM

## 2019-06-19 NOTE — Progress Notes (Signed)
PATIENT: Douglas Phillips DOB: 08/30/1976  REASON FOR VISIT: follow up HISTORY FROM: patient  Virtual Visit via Telephone Note  I connected with Douglas Phillips on 06/19/19 at  8:30 AM EDT by telephone and verified that I am speaking with the correct person using two identifiers.   I discussed the limitations, risks, security and privacy concerns of performing an evaluation and management service by telephone and the availability of in person appointments. I also discussed with the patient that there may be a patient responsible charge related to this service. The patient expressed understanding and agreed to proceed.   History of Present Illness:  06/19/19 Douglas Phillips is a 43 y.o. male here today for follow up of OSA on CPAP.  He has a longstanding history of sleep apnea.  He reports doing well with his new CPAP machine at home.  Compliance report dated 05/19/2019 through 06/17/2019 reveals that he is using his CPAP every day.  He is using it for at least 4 hours every day.  Average usage is 7 hours and 34 minutes.  AHI was 1.9 on 9 cm of water and EPR of 2.  There was a mild leak noted in the 95th percentile of 28.4.  He is currently using nasal pillows.  He states that he is a mouth breather.  He is interested in a chinstrap.   History (copied from Dr Dohmeier's note on 03/12/2019)  HPI:  Douglas Phillips is a 43 y.o. male , seen here in a virtual visit  from Dr. Jerline Pain for a re- evaluation of sleep apnea.   Mr. Loh has been an experienced CPAP user after he received a diagnosis of obstructive sleep apnea about 15 years ago he is now on either his second or third CPAP machine at the current one is about 41 years old and ready to be replaced.  The patient has a medical history of diabetes mellitus with nocturia before he was treated with metformin.  He also has frequent sinus congestion, allergic rhinitis, and he used to have chronic migraines which have meanwhile resolved.  He has a history of back surgery, kidney stones, HTN.   He was considered morbidly obese but lost over 60 pounds in the last 18 months- however this has not changed his dependency on CPAP.   While we are not sure about his current baseline AHI the patient reports that he is unable to initiate sleep without CPAP.  He named an example of his CPAP dependency during a power outage when he was  staying overnight in a hotel.  He was unable to get sleep without CPAP. He is in need of replacement, uses an S 9 machine and P10 nasal pillow in medium size.    Sleep habits are as follows: The patient reports that dinnertime is usually between 630 and 7 PM bedtime varies greater between 9 and 11.  He shares a bedroom with his spouse and Mrs. Mayeda prefers to have the TV on in the background.  While the bedroom is cool, it is neither quiet nor dark. The TV is usually on a timer and will switch off within 60 minutes or so.  The patient stated that he prefers to sleep on his side used to be sleeping prone until he was prescribed CPAP therapy.  He is using 2 pillows for head and neck support and sleeps on a flat bed.  He has observed that if the TV is not switching off by itself  his sleep is fragmented and less restorative.  After achieving better diabetis control, he has been able to sleep through the night without nocturia and averages 7.5 hours of sleep each night.  He often dreams but he does not and act dreams, he rises in the morning at about 6:30 AM but he no longer feels as refreshed as he initially did.  However he was very clear about his sleep quality still being much better than before CPAP.  He endorsed the Epworth sleepiness score at 11 out of 24 points.   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  He is married and has a 41 year old son. He drinks 2 diet Mountain Dew's a day 1 in the morning 1 after lunch and denies any use of  tea or coffee.  He is a lifelong non-smoker and does not use any other form of tobacco.  He considers himself a night owl but was never a nocturnal shift worker.  He seldomly drinks alcohol less than 1 glass/month.  Observations/Objective:  Generalized: Well developed, in no acute distress  Mentation: Alert oriented to time, place, history taking. Follows all commands speech and language fluent   Assessment and Plan:  43 y.o. year old male  has a past medical history of Diabetes mellitus without complication (HCC), High cholesterol, Hypertension, Kidney stones, Migraine, and MIGRAINE HEADACHE (04/05/2007). here with    ICD-10-CM   1. OSA on CPAP  G47.33 For home use only DME continuous positive airway pressure (CPAP)   Z99.89    Douglas Phillips is doing very well with his new CPAP machine.  Compliance report reveals excellent compliance.  There is a mild leak noted.  We will place an order today for a chinstrap.  He was encouraged to continue using CPAP nightly and for greater than 4 hours each night.  We will follow-up in 6 months to ensure leak is corrected.  He was instructed to call sooner with any concerns.  He verbalizes understanding and agreement with this plan.  Orders Placed This Encounter  Procedures  . For home use only DME continuous positive airway pressure (CPAP)    Chin strap please    Order Specific Question:   Length of Need    Answer:   Lifetime    Order Specific Question:   Patient has OSA or probable OSA    Answer:   Yes    Order Specific Question:   Is the patient currently using CPAP in the home    Answer:   Yes    Order Specific Question:   Settings    Answer:   Other see comments    Order Specific Question:   CPAP supplies needed    Answer:   Mask, headgear, cushions, filters, heated tubing and water chamber    No orders of the defined types were placed in this encounter.    Follow Up Instructions:  I discussed the assessment and treatment plan with the  patient. The patient was provided an opportunity to ask questions and all were answered. The patient agreed with the plan and demonstrated an understanding of the instructions.   The patient was advised to call back or seek an in-person evaluation if the symptoms worsen or if the condition fails to improve as anticipated.  I provided 20 minutes of non-face-to-face time during this encounter. Patient is located at home during video visit.  Provider is located in the office.  Rozell SearingBrittany Duff, CMA helped to  facilitate visit.   Debbora Presto, NP

## 2019-06-26 DIAGNOSIS — G4733 Obstructive sleep apnea (adult) (pediatric): Secondary | ICD-10-CM | POA: Diagnosis not present

## 2019-09-20 ENCOUNTER — Telehealth: Payer: Self-pay

## 2019-09-20 NOTE — Telephone Encounter (Signed)
Copied from [place] [phone]. Topic: General - Other >> Sep 20, 2019  9:47 AM Leward Quan A wrote: Reason for CRM: Patient called to say that his Insurance would like Dr Jerline Pain to call in and justify why he is on Semaglutide,0.25 or 0.5MG /DOS, (OZEMPIC, 0.25 OR 0.5 MG/DOSE,) 2 MG/1.5ML SOPN so that they will pay for it other wise patient will not receive refill from the pharmacy. Patient changed insurance to [facility] updated in chart Ph# [phone]

## 2019-09-21 NOTE — Telephone Encounter (Signed)
Prior Josem Kaufmann is in progress.  Waiting for insurance decision.

## 2019-09-21 NOTE — Telephone Encounter (Signed)
Notified patient.

## 2019-10-29 DIAGNOSIS — M545 Low back pain: Secondary | ICD-10-CM | POA: Diagnosis not present

## 2019-10-29 DIAGNOSIS — M5442 Lumbago with sciatica, left side: Secondary | ICD-10-CM | POA: Diagnosis not present

## 2019-10-29 NOTE — Telephone Encounter (Signed)
Pt was very upset that he has still not received his ozempic. Pt requesting CB from [facility] today. Please advise.

## 2019-10-29 NOTE — Telephone Encounter (Signed)
See note

## 2019-10-29 NOTE — Telephone Encounter (Signed)
Douglas Phillips is [facility] is calling regarding a PA for ozempic. The patient's insurance has changed to a different [place]. The information has been faxed by [facility]. Please advise Cb- [phone]

## 2019-10-29 NOTE — Telephone Encounter (Signed)
Notified patient PA was submitted  1 month ago and resubmitted today.Patient was yelling and upset was informed that we have no control over what medications insurance will pay for.

## 2019-10-29 NOTE — Telephone Encounter (Signed)
See below

## 2019-11-06 DIAGNOSIS — M545 Low back pain: Secondary | ICD-10-CM | POA: Diagnosis not present

## 2019-11-06 DIAGNOSIS — M5441 Lumbago with sciatica, right side: Secondary | ICD-10-CM | POA: Diagnosis not present

## 2019-11-12 DIAGNOSIS — M545 Low back pain: Secondary | ICD-10-CM | POA: Diagnosis not present

## 2019-11-13 DIAGNOSIS — M5442 Lumbago with sciatica, left side: Secondary | ICD-10-CM | POA: Diagnosis not present

## 2019-11-13 DIAGNOSIS — M545 Low back pain: Secondary | ICD-10-CM | POA: Diagnosis not present

## 2019-11-19 LAB — HM DIABETES EYE EXAM

## 2019-11-21 ENCOUNTER — Encounter: Payer: Self-pay | Admitting: Family Medicine

## 2019-11-21 DIAGNOSIS — M5020 Other cervical disc displacement, unspecified cervical region: Secondary | ICD-10-CM | POA: Insufficient documentation

## 2019-11-21 DIAGNOSIS — M5127 Other intervertebral disc displacement, lumbosacral region: Secondary | ICD-10-CM | POA: Diagnosis not present

## 2019-12-12 DIAGNOSIS — Z01818 Encounter for other preprocedural examination: Secondary | ICD-10-CM | POA: Diagnosis not present

## 2019-12-20 DIAGNOSIS — M5117 Intervertebral disc disorders with radiculopathy, lumbosacral region: Secondary | ICD-10-CM | POA: Diagnosis not present

## 2019-12-20 DIAGNOSIS — M5127 Other intervertebral disc displacement, lumbosacral region: Secondary | ICD-10-CM | POA: Diagnosis not present

## 2019-12-20 DIAGNOSIS — M5126 Other intervertebral disc displacement, lumbar region: Secondary | ICD-10-CM | POA: Diagnosis not present

## 2019-12-25 ENCOUNTER — Encounter: Payer: Self-pay | Admitting: Family Medicine

## 2019-12-25 ENCOUNTER — Telehealth (INDEPENDENT_AMBULATORY_CARE_PROVIDER_SITE_OTHER): Payer: BC Managed Care – PPO | Admitting: Family Medicine

## 2019-12-25 DIAGNOSIS — G4733 Obstructive sleep apnea (adult) (pediatric): Secondary | ICD-10-CM

## 2019-12-25 DIAGNOSIS — Z9989 Dependence on other enabling machines and devices: Secondary | ICD-10-CM

## 2019-12-25 NOTE — Progress Notes (Signed)
PATIENT: Douglas Phillips DOB: Jan 24, 1976  REASON FOR VISIT: follow up HISTORY FROM: patient  Virtual Visit via Telephone Note  I connected with Douglas Phillips on 12/25/19 at  8:30 AM EST by telephone and verified that I am speaking with the correct person using two identifiers.   I discussed the limitations, risks, security and privacy concerns of performing an evaluation and management service by telephone and the availability of in person appointments. I also discussed with the patient that there may be a patient responsible charge related to this service. The patient expressed understanding and agreed to proceed.   History of Present Illness:  12/25/19 Douglas Phillips is a 44 y.o. male here today for follow up for OSA on CPAP.  He reports that he is doing well on CPAP therapy.  He continues to use nasal pillows with a chinstrap.  Chinstrap has helped with mouth breathing.  He continues to have a dry mouth in the mornings.  Otherwise, he is doing well and without concerns.  Compliance report dated 11/24/2019 through 12/23/2019 reveals that he is using CPAP every night for compliance of 100%.  Every night he uses CPAP greater than 4 hours for compliance 100%.  Average usage is 8 hours and 10 minutes.  Residual AHI is 1.7 on 9 cm of water and an EPR of 2.  Leak has improved.  Leak in the 95th percentile of 20.1 L/min.   History (copied from my note on 06/19/2019)  Douglas Phillips is a 44 y.o. male here today for follow up of OSA on CPAP.  He has a longstanding history of sleep apnea.  He reports doing well with his new CPAP machine at home.  Compliance report dated 05/19/2019 through 06/17/2019 reveals that he is using his CPAP every day.  He is using it for at least 4 hours every day.  Average usage is 7 hours and 34 minutes.  AHI was 1.9 on 9 cm of water and EPR of 2.  There was a mild leak noted in the 95th percentile of 28.4.  He is currently using nasal pillows.  He states that he  is a mouth breather.  He is interested in a chinstrap.   History (copied from Dr Dohmeier's note on 03/12/2019)  [name] a 43 y.o.male, seen here in a virtual visitfrom Dr. Margarita Rana a re- evaluation of sleep apnea.  [name] has been an experienced CPAP user after he received a diagnosis of obstructive sleep apnea about 15 years ago he is now on either his second or third CPAP machine at the current one is about 42 years old and ready to be replaced. The patient has a medical history of diabetes mellitus with nocturia before he was treated with metformin. He also has frequent sinus congestion, allergic rhinitis, and he used to have chronic migraines which have meanwhile resolved. He has a history of back surgery, kidney stones, HTN.He was considered morbidly obese but lost over 60 pounds in the last 18 months-however this has not changed his dependency on CPAP.  While we are not sure about his current baseline AHI the patient reports that he is unable to initiate sleep without CPAP. He named an example of his CPAP dependency duringa power outage when he was staying overnight in a hotel. He was unable to get sleep without CPAP. He is in need of replacement, uses an S 9 machine and P10 nasal pillow in medium size.  Sleep habits are as  follows: The patient reports that dinnertime is usually between 630 and 7 PM bedtime varies greater between 9 and 11. He shares a bedroom with his spouse and [name] prefers to have the TV on in the background. While the bedroom is cool,it is neither quiet nor dark. The TV is usually on a timer and will switch off within 60 minutes or so. The patient stated that he prefers to sleep on his side used to be sleeping prone until he was prescribed CPAP therapy. He is using 2 pillows for head and neck support and sleeps on a flat bed. He has observed that if the TV is not switching off by itself his sleep is fragmented and  less restorative. After achieving better diabetiscontrol,he has been able to sleep through the night without nocturia and averages 7.5 hours of sleep each night. He often dreams but he does not and act dreams, he rises in the morning at about 6:30 AM but he no longer feels as refreshed as he initially did. However he was very clear about his sleep quality still being much better than before CPAP. He endorsed the Epworth sleepiness score at 11 out of 24 points.   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  He is marriedandhas a 32 year old son. He drinks 2 diet Mountain Dew's a day 1 in the morning 1 after lunch and denies any use of tea or coffee. He is a lifelong non-smoker and does not use any other form of tobacco. He considers himself a night owl but was never a nocturnal shift worker. He seldomly drinks alcohol less than 1 glass/month.    Observations/Objective:  Unable to visualize patient. Epic indicates that patients camera is not on. 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    Assessment and Plan:  44 y.o. year old male  has a past medical history of Diabetes mellitus without complication (HCC), High cholesterol, Hypertension, Kidney stones, Migraine, and MIGRAINE HEADACHE (04/05/2007). here with    ICD-10-CM   1. OSA on CPAP  G47.33    Z99.89     Douglas Phillips is doing well with CPAP therapy at home.  Chinstrap has helped with mouth breathing and reduction in leak is noted on compliance report.  I have advised that he try Biotene, over-the-counter, for concerns of dry mouth.  He was encouraged to continue using CPAP nightly and for greater than 4 hours each night.  He will follow-up in 1 year, sooner if needed.  He verbalizes understanding and agreement with this plan.  No orders of the defined types were placed in this encounter.   No orders of the defined types  were placed in this encounter.    Follow Up Instructions:  I discussed the assessment and treatment plan with the patient. The patient was provided an opportunity to ask questions and all were answered. The patient agreed with the plan and demonstrated an understanding of the instructions.   The patient was advised to call back or seek an in-person evaluation if the symptoms worsen or if the condition fails to improve as anticipated.  I provided 15 minutes of non-face-to-face time during this encounter.   patient is located at his place of residence during my chart visit.  Patient is logged on via epic, however, epic indicates that video camera is not operating.  Patient reports that his operating appropriately at home.  We were unable to troubleshoot and my chart  visit transitioned to televisit.  Provider is in the office.   Debbora Presto, NP

## 2020-02-02 ENCOUNTER — Other Ambulatory Visit: Payer: Self-pay | Admitting: Family Medicine

## 2020-02-28 ENCOUNTER — Telehealth: Payer: Self-pay

## 2020-02-28 NOTE — Telephone Encounter (Signed)
Patient refused flu vaccine.

## 2020-03-03 ENCOUNTER — Other Ambulatory Visit: Payer: Self-pay | Admitting: Family Medicine

## 2020-12-25 NOTE — Progress Notes (Deleted)
PATIENT: Douglas Phillips DOB: February 10, 1976  REASON FOR VISIT: follow up HISTORY FROM: patient  Virtual Visit via Telephone Note  I connected with Douglas Phillips on 12/25/20 at  7:45 AM EST by telephone and verified that I am speaking with the correct person using two identifiers.   I discussed the limitations, risks, security and privacy concerns of performing an evaluation and management service by telephone and the availability of in person appointments. I also discussed with the patient that there may be a patient responsible charge related to this service. The patient expressed understanding and agreed to proceed.   History of Present Illness:  12/25/20    12/25/2019 ALL: Douglas Phillips is a 45 y.o. male here today for follow up for OSA on CPAP.  He reports that he is doing well on CPAP therapy.  He continues to use nasal pillows with a chinstrap.  Chinstrap has helped with mouth breathing.  He continues to have a dry mouth in the mornings.  Otherwise, he is doing well and without concerns.  Compliance report dated 11/24/2019 through 12/23/2019 reveals that he is using CPAP every night for compliance of 100%.  Every night he uses CPAP greater than 4 hours for compliance 100%.  Average usage is 8 hours and 10 minutes.  Residual AHI is 1.7 on 9 cm of water and an EPR of 2.  Leak has improved.  Leak in the 95th percentile of 20.1 L/min.   History (copied from my note on 06/19/2019)  Douglas Phillips is a 45 y.o. male here today for follow up of OSA on CPAP.  He has a longstanding history of sleep apnea.  He reports doing well with his new CPAP machine at home.  Compliance report dated 05/19/2019 through 06/17/2019 reveals that he is using his CPAP every day.  He is using it for at least 4 hours every day.  Average usage is 7 hours and 34 minutes.  AHI was 1.9 on 9 cm of water and EPR of 2.  There was a mild leak noted in the 95th percentile of 28.4.  He is currently using nasal  pillows.  He states that he is a mouth breather.  He is interested in a chinstrap.   History (copied from Dr Dohmeier's note on 03/12/2019)  [id number] Douglas Phillips a 45 y.o.male, seen here in a virtual visitfrom Dr. Margarita Rana a re- evaluation of sleep apnea.  [name] been an experienced CPAP user after he received a diagnosis of obstructive sleep apnea about 15 years ago he is now on either his second or third CPAP machine at the current one is about 43 years old and ready to be replaced. The patient has a medical history of diabetes mellitus with nocturia before he was treated with metformin. He also has frequent sinus congestion, allergic rhinitis, and he used to have chronic migraines which have meanwhile resolved. He has a history of back surgery, kidney stones, HTN.He was considered morbidly obese but lost over 60 pounds in the last 18 months-however this has not changed his dependency on CPAP.  While we are not sure about his current baseline AHI the patient reports that he is unable to initiate sleep without CPAP. He named an example of his CPAP dependency duringa power outage when he was staying overnight in a hotel. He was unable to get sleep without CPAP. He is in need of replacement, uses an S 9 machine and P10 nasal pillow in medium size.  Sleep habits are as follows: The patient reports that dinnertime is usually between 630 and 7 PM bedtime varies greater between 9 and 11. He shares a bedroom with his spouse and Mrs. Hopes prefers to have the TV on in the background. While the bedroom is cool,it is neither quiet nor dark. The TV is usually on a timer and will switch off within 60 minutes or so. The patient stated that he prefers to sleep on his side used to be sleeping prone until he was prescribed CPAP therapy. He is using 2 pillows for head and neck support and sleeps on a flat bed. He has observed that if the TV is not switching off by itself  his sleep is fragmented and less restorative. After achieving better diabetiscontrol,he has been able to sleep through the night without nocturia and averages 7.5 hours of sleep each night. He often dreams but he does not and act dreams, he rises in the morning at about 6:30 AM but he no longer feels as refreshed as he initially did. However he was very clear about his sleep quality still being much better than before CPAP. He endorsed the Epworth sleepiness score at 11 out of 24 points.   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  He is marriedandhas a 22 year old son. He drinks 2 diet Mountain Dew's a day 1 in the morning 1 after lunch and denies any use of tea or coffee. He is a lifelong non-smoker and does not use any other form of tobacco. He considers himself a night owl but was never a nocturnal shift worker. He seldomly drinks alcohol less than 1 glass/month.    Observations/Objective:  Unable to visualize patient. Epic indicates that patients camera is not on. 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    Assessment and Plan:  45 y.o. year old male  has a past medical history of Diabetes mellitus without complication (HCC), High cholesterol, Hypertension, Kidney stones, Migraine, and MIGRAINE HEADACHE (04/05/2007). here with  No diagnosis found.  Octavius is doing well with CPAP therapy at home.  Chinstrap has helped with mouth breathing and reduction in leak is noted on compliance report.  I have advised that he try Biotene, over-the-counter, for concerns of dry mouth.  He was encouraged to continue using CPAP nightly and for greater than 4 hours each night.  He will follow-up in 1 year, sooner if needed.  He verbalizes understanding and agreement with this plan.  No orders of the defined types were placed in this encounter.   No orders of the defined types were  placed in this encounter.    Follow Up Instructions:  I discussed the assessment and treatment plan with the patient. The patient was provided an opportunity to ask questions and all were answered. The patient agreed with the plan and demonstrated an understanding of the instructions.   The patient was advised to call back or seek an in-person evaluation if the symptoms worsen or if the condition fails to improve as anticipated.  I provided 15 minutes of non-face-to-face time during this encounter.   patient is located at his place of residence during my chart visit.  Patient is logged on via epic, however, epic indicates that video camera is not operating.  Patient reports that his operating appropriately at home.  We were unable to troubleshoot and my chart visit transitioned to televisit.  Provider is in the office.  Debbora Presto, NP

## 2020-12-30 ENCOUNTER — Telehealth: Payer: Self-pay | Admitting: Family Medicine

## 2020-12-30 DIAGNOSIS — G4733 Obstructive sleep apnea (adult) (pediatric): Secondary | ICD-10-CM
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
## 2021-02-03 ENCOUNTER — Telehealth: Payer: Self-pay | Admitting: Family Medicine

## 2021-02-03 DIAGNOSIS — G4733 Obstructive sleep apnea (adult) (pediatric): Secondary | ICD-10-CM

## 2021-02-13 ENCOUNTER — Telehealth: Payer: Self-pay | Admitting: Family Medicine

## 2021-02-13 NOTE — Telephone Encounter (Signed)
I called pt because he scheduled a new pt appt with Alysia Penna and he is currently a pt of Dr [name] and was last seen in 2020. Pt said that he had moved and that he is a lot closer to [place] now and wanted to know if it was okay to transfer care from dr [name] to [facility]. Is this okay?

## 2021-02-13 NOTE — Telephone Encounter (Signed)
ok 

## 2021-02-14 NOTE — Telephone Encounter (Signed)
Ok with me.   Katina Degree. [name], MD 02/14/2021 11:05 AM

## 2021-02-14 NOTE — Telephone Encounter (Signed)
Ok to transfer Per Dr [name]

## 2021-03-04 ENCOUNTER — Other Ambulatory Visit: Payer: Self-pay

## 2021-03-06 ENCOUNTER — Other Ambulatory Visit: Payer: Self-pay

## 2021-03-06 ENCOUNTER — Ambulatory Visit (INDEPENDENT_AMBULATORY_CARE_PROVIDER_SITE_OTHER): Payer: 59 | Admitting: Nurse Practitioner

## 2021-03-06 ENCOUNTER — Encounter: Payer: Self-pay | Admitting: Nurse Practitioner

## 2021-03-06 VITALS — BP 122/80 | HR 60 | Temp 97.3°F | Ht 67.0 in | Wt 198.0 lb

## 2021-03-06 DIAGNOSIS — E1165 Type 2 diabetes mellitus with hyperglycemia: Secondary | ICD-10-CM

## 2021-03-06 DIAGNOSIS — R351 Nocturia: Secondary | ICD-10-CM | POA: Diagnosis not present

## 2021-03-06 DIAGNOSIS — N529 Male erectile dysfunction, unspecified: Secondary | ICD-10-CM

## 2021-03-06 DIAGNOSIS — E785 Hyperlipidemia, unspecified: Secondary | ICD-10-CM

## 2021-03-06 DIAGNOSIS — E1169 Type 2 diabetes mellitus with other specified complication: Secondary | ICD-10-CM

## 2021-03-06 LAB — COMPREHENSIVE METABOLIC PANEL
ALT: 32 U/L (ref 0–53)
AST: 13 U/L (ref 0–37)
Albumin: 4.7 g/dL (ref 3.5–5.2)
Alkaline Phosphatase: 62 U/L (ref 39–117)
BUN: 13 mg/dL (ref 6–23)
CO2: 26 mEq/L (ref 19–32)
Calcium: 9.4 mg/dL (ref 8.4–10.5)
Chloride: 99 mEq/L (ref 96–112)
Creatinine, Ser: 0.69 mg/dL (ref 0.40–1.50)
GFR: 113.64 mL/min (ref >60.00)
Glucose, Bld: 369 mg/dL — ABNORMAL HIGH (ref 70–99)
Potassium: 4.2 mEq/L (ref 3.5–5.1)
Sodium: 136 mEq/L (ref 135–145)
Total Bilirubin: 0.7 mg/dL (ref 0.2–1.2)
Total Protein: 6.8 g/dL (ref 6.0–8.3)

## 2021-03-06 LAB — LDL CHOLESTEROL, DIRECT: Direct LDL: 100 mg/dL

## 2021-03-06 LAB — MICROALBUMIN / CREATININE URINE RATIO
Creatinine,U: 70.7 mg/dL
Microalb Creat Ratio: 1 mg/g (ref 0.0–30.0)
Microalb, Ur: <0.7 mg/dL (ref 0.0–1.9)

## 2021-03-06 LAB — LIPID PANEL
Cholesterol: 176 mg/dL (ref 0–200)
HDL: 32.4 mg/dL — ABNORMAL LOW (ref >39.00)
NonHDL: 144.07
Total CHOL/HDL Ratio: 5
Triglycerides: 261 mg/dL — ABNORMAL HIGH (ref 0.0–149.0)
VLDL: 52.2 mg/dL — ABNORMAL HIGH (ref 0.0–40.0)

## 2021-03-06 LAB — PSA: PSA: 0.28 ng/mL (ref 0.10–4.00)

## 2021-03-06 LAB — TESTOSTERONE: Testosterone: 305.05 ng/dL (ref 300.00–890.00)

## 2021-03-06 LAB — HEMOGLOBIN A1C: Hgb A1c MFr Bld: 13.5 % — ABNORMAL HIGH (ref 4.6–6.5)

## 2021-03-06 LAB — TSH: TSH: 2.26 u[IU]/mL (ref 0.35–4.50)

## 2021-03-06 MED ORDER — PEN NEEDLES 31G X 6 MM MISC: 1.0000 "application " | 1 refills | Status: DC

## 2021-03-06 MED ORDER — BLOOD GLUCOSE METER KIT: PACK | 0 refills | Status: AC

## 2021-03-06 MED ORDER — FENOFIBRATE 134 MG PO CAPS: 134.0000 mg | ORAL_CAPSULE | Freq: Every day | ORAL | 3 refills | Status: DC

## 2021-03-06 MED ORDER — OZEMPIC (0.25 OR 0.5 MG/DOSE) 2 MG/1.5ML ~~LOC~~ SOPN: 0.2500 mg | PEN_INJECTOR | SUBCUTANEOUS | 5 refills | Status: DC

## 2021-03-06 NOTE — Addendum Note (Signed)
Addended by: Michaela Corner on: 03/06/2021 04:25 PM   Modules accepted: Orders

## 2021-03-06 NOTE — Progress Notes (Addendum)
Subjective:  Patient ID: Douglas Phillips, male    DOB: December 29, 1975  Age: 45 y.o. MRN: [id number]  CC: Establish Care (TOC/DM-Pt has not took medications x 1 year. )  Erectile Dysfunction This is a chronic problem. The current episode started more than 1 year ago. The problem is unchanged. The nature of his difficulty is maintaining erection. He reports no anxiety, decreased libido or performance anxiety. He reports his erection duration to be less than 1 minute. Irritative symptoms include nocturia. Irritative symptoms do not include frequency or urgency. Obstructive symptoms do not include dribbling, incomplete emptying, an intermittent stream, a slower stream, straining or a weak stream. Pertinent negatives include no chills, dysuria, genital pain, hematuria, hesitancy or inability to urinate. The symptoms are aggravated by poor sleep and stress. Past treatments include nothing. He has had no adverse reactions caused by medications. Risk factors include diabetes mellitus (back surgery in 2021).   DM (diabetes mellitus) ([place]) Unable to tolerate low dose metformin: nausea, ABD pain and diarrhea Did not start ozempic in 2020 due to lack of insurance coverage. Last HgbA1c of 14% BP at goal Normal foot exam  Repeat hgbA1c, urine microalbumin, CMP and lipid panel: Uncontrolled DM with hgbA1c at 13. Ozempic rx sent Normal renal and liver function Pending C-peptide Glucometer rx sent. Need to check glucose every morning before breakfast. F/up in 36monthand not 31month  Erectile dysfunction Onset in 2020 after use of metformin per patient. Did not improve after discontinuation of medication.Unable to sustain an erection Associated to nocturia Some improvement with OTC supplement.  Check testosterone, TSH, urinalysis and PSA: Normal TSH, PSA, and testosterone. Pending urinalysis Advised to decrease caffeine intake to 16oz a day, do not drink caffeinated drink after 2pm, no liquids within  2hrs of bedtime.  Dyslipidemia associated with type 2 diabetes mellitus (HCC) Abnormal lipid panel: elevated triglyceride. Start fenofibrate. Rx sent.  Lipid Panel     Component Value Date/Time   CHOL 176 03/06/2021 1052   TRIG 261.0 (H) 03/06/2021 1052   HDL 32.40 (L) 03/06/2021 1052   CHOLHDL 5 03/06/2021 1052   VLDL 52.2 (H) 03/06/2021 1052   LDLDIRECT 100.0 03/06/2021 1052   Wt Readings from Last 3 Encounters:  03/06/21 198 lb (89.8 kg)  02/01/19 208 lb (94.3 kg)   BP Readings from Last 3 Encounters:  03/06/21 122/80  02/01/19 132/90  12/30/14 134/71   Reviewed past Medical, Social and Family history today.  Outpatient Medications Prior to Visit  Medication Sig Dispense Refill  . atorvastatin (LIPITOR) 40 MG tablet TAKE 1 TABLET(40 MG) BY MOUTH DAILY (Patient not taking: Reported on 03/06/2021) 90 tablet 3  . glucose blood test strip Use as instructed Check glucose blood sugars once daily (Patient not taking: Reported on 03/06/2021) 100 each 6  . Microlet Lancets MISC TEST ONCE A DAY (Patient not taking: Reported on 03/06/2021) 100 each 12  . Semaglutide,0.25 or 0.5MG/DOS, (OZEMPIC, 0.25 OR 0.5 MG/DOSE,) 2 MG/1.5ML SOPN Inject 0.25 mg into the skin once a week. (Patient not taking: Reported on 03/06/2021) 1 pen 3   No facility-administered medications prior to visit.   ROS See HPI  Objective:  BP 122/80 (BP Location: Left Arm, Patient Position: Sitting, Cuff Size: Large)   Pulse 60   Temp (!) 97.3 F (36.3 C) (Temporal)   Ht 5' 7"  (1.702 m)   Wt 198 lb (89.8 kg)   SpO2 99%   BMI 31.01 kg/m   Physical Exam Constitutional:  Appearance: He is obese.  Cardiovascular:     Rate and Rhythm: Normal rate and regular rhythm.     Pulses:          Dorsalis pedis pulses are 2+ on the right side and 2+ on the left side.       Posterior tibial pulses are 2+ on the right side and 2+ on the left side.     Heart sounds: Gallop present. S4 sounds present.   Pulmonary:      Effort: Pulmonary effort is normal.     Breath sounds: Normal breath sounds.  Musculoskeletal:     Right lower leg: No edema.     Left lower leg: No edema.     Right foot: Normal range of motion. No deformity, bunion, Charcot foot, foot drop or prominent metatarsal heads.     Left foot: Normal range of motion. No deformity, bunion, Charcot foot, foot drop or prominent metatarsal heads.  Feet:     Right foot:     Protective Sensation: 10 sites tested. 10 sites sensed.     Skin integrity: Callus and dry skin present. No ulcer, blister, skin breakdown, erythema, warmth or fissure.     Toenail Condition: Right toenails are abnormally thick.     Left foot:     Protective Sensation: 10 sites tested. 10 sites sensed.     Skin integrity: Callus and dry skin present. No ulcer, blister, skin breakdown, erythema, warmth or fissure.     Toenail Condition: Left toenails are abnormally thick. Fungal disease present. Neurological:     Mental Status: He is alert and oriented to person, place, and time.    Assessment & Plan:  This visit occurred during the SARS-CoV-2 public health emergency.  Safety protocols were in place, including screening questions prior to the visit, additional usage of staff PPE, and extensive cleaning of exam room while observing appropriate contact time as indicated for disinfecting solutions.   Douglas Phillips was seen today for establish care.  Diagnoses and all orders for this visit:  Type 2 diabetes mellitus with hyperglycemia, without long-term current use of insulin (HCC) -     Comprehensive metabolic panel -     Hemoglobin A1c -     Microalbumin / creatinine urine ratio -     C-peptide -     Semaglutide,0.25 or 0.5MG/DOS, (OZEMPIC, 0.25 OR 0.5 MG/DOSE,) 2 MG/1.5ML SOPN; Inject 0.25 mg into the skin once a week. -     Insulin Pen Needle (PEN NEEDLES) 31G X 6 MM MISC; 1 application by Does not apply route once a week. For Ozempic pen -     blood glucose meter kit and  supplies; Dispense based on patient and insurance preference. Use one time daily. ICD10: E11.65  Dyslipidemia associated with type 2 diabetes mellitus (HCC) -     Comprehensive metabolic panel -     Lipid panel -     fenofibrate micronized (LOFIBRA) 134 MG capsule; Take 1 capsule (134 mg total) by mouth daily before breakfast.  Erectile dysfunction, unspecified erectile dysfunction type -     TSH -     PSA -     Testosterone  Nocturia more than twice per night -     PSA -     Urinalysis w microscopic + reflex cultur  Other orders -     LDL cholesterol, direct   Problem List Items Addressed This Visit      Endocrine   DM (diabetes mellitus) ([place]) -  Primary    Unable to tolerate low dose metformin: nausea, ABD pain and diarrhea Did not start ozempic in 2020 due to lack of insurance coverage. Last HgbA1c of 14% BP at goal Normal foot exam  Repeat hgbA1c, urine microalbumin, CMP and lipid panel: Uncontrolled DM with hgbA1c at 13. Ozempic rx sent Normal renal and liver function Pending C-peptide Glucometer rx sent. Need to check glucose every morning before breakfast. F/up in 18monthand not 357month      Relevant Medications   Semaglutide,0.25 or 0.5MG/DOS, (OZEMPIC, 0.25 OR 0.5 MG/DOSE,) 2 MG/1.5ML SOPN   Insulin Pen Needle (PEN NEEDLES) 31G X 6 MM MISC   blood glucose meter kit and supplies   Other Relevant Orders   Comprehensive metabolic panel (Completed)   Hemoglobin A1c (Completed)   Microalbumin / creatinine urine ratio (Completed)   C-peptide   Dyslipidemia associated with type 2 diabetes mellitus (HCC)    Abnormal lipid panel: elevated triglyceride. Start fenofibrate. Rx sent.  Lipid Panel     Component Value Date/Time   CHOL 176 03/06/2021 1052   TRIG 261.0 (H) 03/06/2021 1052   HDL 32.40 (L) 03/06/2021 1052   CHOLHDL 5 03/06/2021 1052   VLDL 52.2 (H) 03/06/2021 1052   LDLDIRECT 100.0 03/06/2021 1052        Relevant Medications   Semaglutide,0.25 or  0.5MG/DOS, (OZEMPIC, 0.25 OR 0.5 MG/DOSE,) 2 MG/1.5ML SOPN   fenofibrate micronized (LOFIBRA) 134 MG capsule   Other Relevant Orders   Comprehensive metabolic panel (Completed)   Lipid panel (Completed)     Other   Erectile dysfunction    Onset in 2020 after use of metformin per patient. Did not improve after discontinuation of medication.Unable to sustain an erection Associated to nocturia Some improvement with OTC supplement.  Check testosterone, TSH, urinalysis and PSA: Normal TSH, PSA, and testosterone. Pending urinalysis Advised to decrease caffeine intake to 16oz a day, do not drink caffeinated drink after 2pm, no liquids within 2hrs of bedtime.      Relevant Orders   TSH (Completed)   PSA (Completed)   Testosterone (Completed)   Nocturia more than twice per night   Relevant Orders   PSA (Completed)   Urinalysis w microscopic + reflex cultur      Follow-up: Return in about 4 weeks (around 04/03/2021) for DM  .  ChWilfred LacyNP

## 2021-03-06 NOTE — Assessment & Plan Note (Addendum)
Onset in 2020 after use of metformin per patient. Did not improve after discontinuation of medication.Unable to sustain an erection Associated to nocturia Some improvement with OTC supplement.  Check testosterone, TSH, urinalysis and PSA: Normal TSH, PSA, and testosterone. Pending urinalysis Advised to decrease caffeine intake to 16oz a day, do not drink caffeinated drink after 2pm, no liquids within 2hrs of bedtime.

## 2021-03-06 NOTE — Patient Instructions (Signed)
Go to lab for blood draw and urine collection  Schedule appt with neurology for OSA management  Decrease caffeine intake to 16oz a day, do not drink caffeinated drink after 2pm, no liquids within 2hrs of bedtime.

## 2021-03-06 NOTE — Assessment & Plan Note (Signed)
Abnormal lipid panel: elevated triglyceride. Start fenofibrate. Rx sent.  Lipid Panel     Component Value Date/Time   CHOL 176 03/06/2021 1052   TRIG 261.0 (H) 03/06/2021 1052   HDL 32.40 (L) 03/06/2021 1052   CHOLHDL 5 03/06/2021 1052   VLDL 52.2 (H) 03/06/2021 1052   LDLDIRECT 100.0 03/06/2021 1052

## 2021-03-06 NOTE — Assessment & Plan Note (Addendum)
Unable to tolerate low dose metformin: nausea, ABD pain and diarrhea Did not start ozempic in 2020 due to lack of insurance coverage. Last HgbA1c of 14% BP at goal Normal foot exam  Repeat hgbA1c, urine microalbumin, CMP and lipid panel: Uncontrolled DM with hgbA1c at 13. Ozempic rx sent Normal renal and liver function Pending C-peptide Glucometer rx sent. Need to check glucose every morning before breakfast. F/up in 68month and not 88months.

## 2021-03-07 LAB — URINALYSIS W MICROSCOPIC + REFLEX CULTURE
Bacteria, UA: NONE SEEN /HPF
Bilirubin Urine: NEGATIVE
Hgb urine dipstick: NEGATIVE
Hyaline Cast: NONE SEEN /LPF
Ketones, ur: NEGATIVE
Leukocyte Esterase: NEGATIVE
Nitrites, Initial: NEGATIVE
Protein, ur: NEGATIVE
RBC / HPF: NONE SEEN /HPF (ref 0–2)
Specific Gravity, Urine: 1.045 — ABNORMAL HIGH (ref 1.001–1.03)
Squamous Epithelial / HPF: NONE SEEN /HPF (ref <=5)
WBC, UA: NONE SEEN /HPF (ref 0–5)
pH: <=5 (ref 5.0–8.0)

## 2021-03-07 LAB — NO CULTURE INDICATED

## 2021-03-07 LAB — C-PEPTIDE: C-Peptide: 1.09 ng/mL (ref 0.80–3.85)

## 2021-03-07 NOTE — Addendum Note (Signed)
Addended by: Michaela Corner on: 03/07/2021 01:36 PM   Modules accepted: Orders

## 2021-03-16 ENCOUNTER — Telehealth: Payer: Self-pay | Admitting: Nurse Practitioner

## 2021-03-16 DIAGNOSIS — E1165 Type 2 diabetes mellitus with hyperglycemia: Secondary | ICD-10-CM

## 2021-03-16 MED ORDER — EMPAGLIFLOZIN 10 MG PO TABS
10.0000 mg | ORAL_TABLET | Freq: Every day | ORAL | 5 refills | Status: DC
Start: 2021-03-16 — End: 2021-03-25

## 2021-03-16 MED ORDER — INSULIN DETEMIR 100 UNIT/ML FLEXPEN: 15.0000 [IU] | PEN_INJECTOR | Freq: Every day | SUBCUTANEOUS | 2 refills | Status: DC

## 2021-03-16 MED ORDER — PEN NEEDLES 31G X 6 MM MISC: 1.0000 "application " | 1 refills | Status: DC

## 2021-03-16 NOTE — Telephone Encounter (Signed)
Ozempic is not covered by his insurance Needs to switch to levemir injection and jardiance. Monitor glucose before breakfast and supper. Schedule 61month f/up appt with me

## 2021-03-17 NOTE — Telephone Encounter (Signed)
Patient notified and verbalized understanding. 

## 2021-03-24 ENCOUNTER — Telehealth: Payer: Self-pay | Admitting: Nurse Practitioner

## 2021-03-24 DIAGNOSIS — E1165 Type 2 diabetes mellitus with hyperglycemia: Secondary | ICD-10-CM

## 2021-03-24 NOTE — Telephone Encounter (Signed)
Pt is wanting a cb concerning two of his scripts. His empagliflozin (JARDIANCE) 10 MG TABS tablet [[id number]] is now coming back at $200 to refill and his cholesterol script. He thinks there is a PA involved. Please advise pt at [phone].

## 2021-03-24 NOTE — Telephone Encounter (Signed)
Spoke with patient and he states due to his high deductible he is having a hard time getting a Rx. Pt states he is going to contact his insurance and find out what is actually approved under his coverage so that he can get the medication he needs. Pt will call back once he finds out the information from insurance.

## 2021-03-25 MED ORDER — PIOGLITAZONE HCL 15 MG PO TABS: 15.0000 mg | ORAL_TABLET | Freq: Every day | ORAL | 5 refills | Status: DC

## 2021-03-25 MED ORDER — DAPAGLIFLOZIN PROPANEDIOL 5 MG PO TABS: 5.0000 mg | ORAL_TABLET | Freq: Every day | ORAL | 5 refills | Status: DC

## 2021-03-25 NOTE — Telephone Encounter (Signed)
Pt returned call and states insurance informed him that Pioglitazone was approved under his plan and would be much cheaper than the Jardiance and he would like to try the medication that is covered. Pt states they could not give him a alternative for Ozempic or Levemir that was within his budget so those medications he would not be able to get. Pt would like to have the Pioglitazone sent to his pharmacy. Pt is aware provider is out of office this week but he would be contacted by our office or pharmacy when Rx was sent.

## 2021-03-25 NOTE — Addendum Note (Signed)
Addended by: Michaela Corner on: 03/25/2021 09:52 PM   Modules accepted: Orders

## 2021-03-26 MED ORDER — PIOGLITAZONE HCL 15 MG PO TABS: 15.0000 mg | ORAL_TABLET | Freq: Every day | ORAL | 1 refills | Status: DC

## 2021-03-26 MED ORDER — DAPAGLIFLOZIN PROPANEDIOL 5 MG PO TABS: 5.0000 mg | ORAL_TABLET | Freq: Every day | ORAL | 1 refills | Status: DC

## 2021-03-26 NOTE — Addendum Note (Signed)
Addended by: Mickle Plumb L on: 03/26/2021 10:50 AM   Modules accepted: Orders

## 2021-03-26 NOTE — Telephone Encounter (Signed)
Patient notified VIA phone and will check with [facility] to check on getting the first 30 days of both medications there.  He asked if they could be sent to [facility] so he can get a cheaper price on them.  RX sent to mail order as asked.  Dm/cma

## 2021-04-01 ENCOUNTER — Other Ambulatory Visit: Payer: Self-pay | Admitting: Nurse Practitioner

## 2021-04-01 DIAGNOSIS — E1165 Type 2 diabetes mellitus with hyperglycemia: Secondary | ICD-10-CM

## 2021-04-01 MED ORDER — EMPAGLIFLOZIN 10 MG PO TABS: 10.0000 mg | ORAL_TABLET | Freq: Every day | ORAL | 1 refills | Status: DC

## 2021-04-06 ENCOUNTER — Other Ambulatory Visit: Payer: Self-pay

## 2021-04-07 ENCOUNTER — Encounter: Payer: Self-pay | Admitting: Nurse Practitioner

## 2021-04-07 ENCOUNTER — Telehealth: Payer: Self-pay | Admitting: Nurse Practitioner

## 2021-04-07 ENCOUNTER — Ambulatory Visit (INDEPENDENT_AMBULATORY_CARE_PROVIDER_SITE_OTHER): Payer: 59 | Admitting: Nurse Practitioner

## 2021-04-07 VITALS — BP 122/68 | HR 60 | Temp 97.4°F | Ht 67.0 in | Wt 194.4 lb

## 2021-04-07 DIAGNOSIS — E1165 Type 2 diabetes mellitus with hyperglycemia: Secondary | ICD-10-CM

## 2021-04-07 DIAGNOSIS — E1169 Type 2 diabetes mellitus with other specified complication: Secondary | ICD-10-CM

## 2021-04-07 DIAGNOSIS — Z794 Long term (current) use of insulin: Secondary | ICD-10-CM

## 2021-04-07 DIAGNOSIS — E785 Hyperlipidemia, unspecified: Secondary | ICD-10-CM

## 2021-04-07 MED ORDER — "SYRINGE 20G X 1"" 3 ML MISC": 1.0000 [IU] | Freq: Two times a day (BID) | 2 refills | Status: DC

## 2021-04-07 MED ORDER — HUMULIN 70/30 (70-30) 100 UNIT/ML ~~LOC~~ SUSP
10.0000 [IU] | Freq: Two times a day (BID) | SUBCUTANEOUS | 1 refills | Status: DC
Start: 2021-04-07 — End: 2021-05-13

## 2021-04-07 NOTE — Progress Notes (Signed)
Subjective:  Patient ID: Douglas Phillips, male    DOB: 11/14/1976  Age: 45 y.o. MRN: [id number]  CC: Follow-up (1 month f/u on DM and Hyperlipidemia. /Blood sugars at home range 427-257, pt states it has been gradually getting lower over the past 2 weeks. )  HPI  DM (diabetes mellitus) ([place]) uncontrolled Unable to afford SGLT-2, DPP-4, basal insulin, or meal insulin due to high deductible insurance plan. He has to meet $5000 deductible. With GoodRx coupon, he agreed to take Humulin 70/30 which is $99.86 per vial. New rx sent. Advised about risk of hypoglycemia, need to monitor glucose BID before meals, and f/up in 75month He declined referral to nutritionist.  Dyslipidemia associated with type 2 diabetes mellitus ([place] Provided fenofibrate rx coupon from GoodRx.   Reviewed past Medical, Social and Family history today.  Outpatient Medications Prior to Visit  Medication Sig Dispense Refill  . pioglitazone (ACTOS) 15 MG tablet Take 1 tablet (15 mg total) by mouth daily. 90 tablet 1  . blood glucose meter kit and supplies Dispense based on patient and insurance preference. Use one time daily. ICD10: E11.65 (Patient not taking: Reported on 04/07/2021) 1 each 0  . fenofibrate micronized (LOFIBRA) 134 MG capsule Take 1 capsule (134 mg total) by mouth daily before breakfast. (Patient not taking: Reported on 04/07/2021) 90 capsule 3  . Insulin Pen Needle (PEN NEEDLES) 31G X 6 MM MISC 1 application by Does not apply route once a week. For levemir pen (Patient not taking: Reported on 04/07/2021) 12 each 1  . empagliflozin (JARDIANCE) 10 MG TABS tablet Take 1 tablet (10 mg total) by mouth daily. (Patient not taking: Reported on 04/07/2021) 90 tablet 1   No facility-administered medications prior to visit.    ROS See HPI  Objective:  BP 122/68 (BP Location: Left Arm, Patient Position: Sitting, Cuff Size: Normal)   Pulse 60   Temp (!) 97.4 F (36.3 C) (Temporal)   Ht 5' 7"  (1.702 m)   Wt  194 lb 6.4 oz (88.2 kg)   SpO2 98%   BMI 30.45 kg/m   Physical Exam Vitals reviewed.  Constitutional:      Appearance: He is obese.  Cardiovascular:     Rate and Rhythm: Normal rate.     Pulses: Normal pulses.  Pulmonary:     Effort: Pulmonary effort is normal.  Neurological:     Mental Status: He is alert and oriented to person, place, and time.  Psychiatric:        Mood and Affect: Mood normal.        Behavior: Behavior normal.        Thought Content: Thought content normal.     Assessment & Plan:  This visit occurred during the SARS-CoV-2 public health emergency.  Safety protocols were in place, including screening questions prior to the visit, additional usage of staff PPE, and extensive cleaning of exam room while observing appropriate contact time as indicated for disinfecting solutions.   JDevaughnwas seen today for follow-up.  Diagnoses and all orders for this visit:  Type 2 diabetes mellitus with hyperglycemia, with long-term current use of insulin (HCC) -     insulin NPH-regular Human (HUMULIN 70/30) (70-30) 100 UNIT/ML injection; Inject 10 Units into the skin 2 (two) times daily with a meal. Increase by 3units every 5days if fasting glucose is >150 -     Syringe/Needle, Disp, (SYRINGE 3CC/20GX1") 20G X 1" 3 ML MISC; 1 Units by Does not apply route 2 (  two) times daily with a meal.  Dyslipidemia associated with type 2 diabetes mellitus ([place])   Problem List Items Addressed This Visit      Endocrine   DM (diabetes mellitus) ([place]) - Primary    uncontrolled Unable to afford SGLT-2, DPP-4, basal insulin, or meal insulin due to high deductible insurance plan. He has to meet $5000 deductible. With GoodRx coupon, he agreed to take Humulin 70/30 which is $99.86 per vial. New rx sent. Advised about risk of hypoglycemia, need to monitor glucose BID before meals, and f/up in 78month He declined referral to nutritionist.      Relevant Medications   insulin NPH-regular  Human (HUMULIN 70/30) (70-30) 100 UNIT/ML injection   Syringe/Needle, Disp, (SYRINGE 3CC/20GX1") 20G X 1" 3 ML MISC   Dyslipidemia associated with type 2 diabetes mellitus ([place]    Provided fenofibrate rx coupon from GoodRx.      Relevant Medications   insulin NPH-regular Human (HUMULIN 70/30) (70-30) 100 UNIT/ML injection      Follow-up: Return in about 4 weeks (around 05/05/2021) for DM (327ms).  ChWilfred LacyNP

## 2021-04-07 NOTE — Assessment & Plan Note (Signed)
Provided fenofibrate rx coupon from GoodRx.

## 2021-04-07 NOTE — Telephone Encounter (Signed)
What is the name of the medication? fenofibrate micronized (LOFIBRA) 134 MG capsule [[id number]]    Have you contacted your pharmacy to request a refill? Pt was seen today 04/07/21 and he is under the impression he is getting this script.  Which pharmacy would you like this sent to? Pharmacy  [id number] - [place], [place] - [address] AT [address]  [address] [place] [address]  Phone:  [phone] Fax:  [phone]  DEA #:  [id number]      Patient notified that their request is being sent to the clinical staff for review and that they should receive a call once it is complete. If they do not receive a call within 72 hours they can check with their pharmacy or our office.

## 2021-04-07 NOTE — Patient Instructions (Signed)
Use good rx app to fill fenofribrate and humulin prescription. Send glucose readings (AM and PM)  through mychart in 2week.  Hypoglycemia Hypoglycemia is when the sugar (glucose) level in your blood is too low. Low blood sugar can happen to people who have diabetes and people who do not have diabetes. Low blood sugar can happen quickly, and it can be an emergency. What are the causes? This condition happens most often in people who have diabetes and may be caused by:  Diabetes medicine.  Not eating enough, or not eating often enough.  Doing more physical activity.  Drinking alcohol on an empty stomach. If you do not have diabetes, hypoglycemia may be caused by:  A tumor in the pancreas.  Not eating enough, or not eating for long periods at a time (fasting).  A very bad infection or illness.  Problems after having weight loss (bariatric) surgery.  Kidney failure or liver failure.  Certain medicines. What increases the risk? This condition is more likely to develop in people who:  Have diabetes and take medicines to lower their blood sugar.  Abuse alcohol.  Have a very bad illness. What are the signs or symptoms? Symptoms depend on whether your low blood sugar is mild, moderate, or very low. Mild  Hunger.  Feeling worried or nervous (anxious).  Sweating and feeling clammy.  Feeling dizzy or light-headed.  Being sleepy or having trouble sleeping.  Feeling like you may vomit (nauseous).  A fast heartbeat.  A headache.  Blurry vision.  Being irritable or grouchy.  Tingling or loss of feeling (numbness) around your mouth, lips, or tongue.  Trouble with moving (coordination). Moderate  Confusion and poor judgment.  Behavior changes.  Weakness.  Uneven heartbeats. Very low Very low blood sugar (severe hypoglycemia) is a medical emergency. It can cause:  Fainting.  Jerky movements that you cannot control (seizure).  Loss of consciousness  (coma).  Death. How is this treated? Treating low blood sugar Low blood sugar is often treated by eating or drinking something sugary right away. The snack should contain 15 grams of a fast-acting carb (carbohydrate). Options include:  4 oz (120 mL) of fruit juice.  4-6 oz (120-150 mL) of regular soda (not diet soda).  8 oz (240 mL) of low-fat milk.  Several pieces of hard candy. Check food labels to find out how many to eat for 15 grams.  1 Tbsp (15 mL) of sugar or honey. Treating low blood sugar if you have diabetes If you can think clearly and swallow safely, follow the 15:15 rule:  Take 15 grams of a fast-acting carb. Talk with your doctor about how much you should take.  Always keep a source of fast-acting carb with you, such as: ? Sugar tablets (glucose pills). Take 4 pills. ? Several pieces of hard candy. Check food labels to see how many pieces to eat for 15 grams. ? 4 oz (120 mL) of fruit juice. ? 4-6 oz (120-150 mL) of regular (not diet) soda. ? 1 Tbsp (15 mL) of honey or sugar.  Check your blood sugar 15 minutes after you take the carb.  If your blood sugar is still at or below 70 mg/dL (3.9 mmol/L), take 15 grams of a carb again.  If your blood sugar does not go above 70 mg/dL (3.9 mmol/L) after 3 tries, get help right away.  After your blood sugar goes back to normal, eat a meal or a snack within 1 hour.   Treating very low  blood sugar If your blood sugar is at or below 54 mg/dL (3 mmol/L), you have very low blood sugar, or severe hypoglycemia. This is an emergency. Get medical help right away. If you have very low blood sugar and you cannot eat or drink, you will need to be given a hormone called glucagon. A family member or friend should learn how to check your blood sugar and how to give you glucagon. Ask your doctor if you need to have an emergency glucagon kit at home. Very low blood sugar may also need to be treated in a hospital. Follow these instructions at  home: General instructions  Take over-the-counter and prescription medicines only as told by your doctor.  Stay aware of your blood sugar as told by your doctor.  If you drink alcohol: ? Limit how much you use to:  0-1 drink a day for nonpregnant women.  0-2 drinks a day for men. ? Be aware of how much alcohol is in your drink. In the [place] one drink equals one 12 oz bottle of beer (355 mL), one 5 oz glass of wine (148 mL), or one 1 oz glass of hard liquor (44 mL).  Keep all follow-up visits as told by your doctor. This is important. If you have diabetes:  Always have a rapid-acting carb (15 grams) option with you to treat low blood sugar.  Follow your diabetes care plan as told by your doctor. Make sure you: ? Know the symptoms of low blood sugar. ? Check your blood sugar as often as told by your doctor. Always check it before and after exercise. ? Always check your blood sugar before you drive. ? Take your medicines as told. ? Follow your meal plan. ? Eat on time. Do not skip meals.  Share your diabetes care plan with: ? Your work or school. ? People you live with.  Carry a card or wear jewelry that says you have diabetes.   Contact a doctor if:  You have trouble keeping your blood sugar in your target range.  You have low blood sugar often. Get help right away if:  You still have symptoms after you eat or drink something that contains 15 grams of fast-acting carb and you cannot get your blood sugar above 70 mg/dL by following the 15:15 rule.  Your blood sugar is at or below 54 mg/dL (3 mmol/L).  You have a seizure.  You faint. These symptoms may be an emergency. Do not wait to see if the symptoms will go away. Get medical help right away. Call your local emergency services (911 in the [place] Do not drive yourself to the hospital. Summary  Hypoglycemia happens when the level of sugar (glucose) in your blood is too low.  Low blood sugar can happen to people who  have diabetes and people who do not have diabetes. Low blood sugar can happen quickly, and it can be an emergency.  Make sure you know the symptoms of low blood sugar and know how to treat it.  Always keep a source of sugar (fast-acting carb) with you to treat low blood sugar. This information is not intended to replace advice given to you by your health care provider. Make sure you discuss any questions you have with your health care provider. Document Revised: 10/24/2019 Document Reviewed: 10/24/2019 Elsevier Patient Education  2021 [employer].

## 2021-04-07 NOTE — Assessment & Plan Note (Signed)
uncontrolled Unable to afford SGLT-2, DPP-4, basal insulin, or meal insulin due to high deductible insurance plan. He has to meet $5000 deductible. With GoodRx coupon, he agreed to take Humulin 70/30 which is $99.86 per vial. New rx sent. Advised about risk of hypoglycemia, need to monitor glucose BID before meals, and f/up in 73month. He declined referral to nutritionist.

## 2021-04-09 NOTE — Telephone Encounter (Signed)
Spoke with patient and clarified GoodRx card is to be used with Fenofibrate Rx. Pt verbalized understanding.

## 2021-04-15 ENCOUNTER — Emergency Department (HOSPITAL_COMMUNITY)
Admission: EM | Admit: 2021-04-15 | Discharge: 2021-04-15 | Disposition: A | Discharge status: Home / Self Care | Payer: 59 | Attending: Emergency Medicine | Admitting: Emergency Medicine

## 2021-04-15 ENCOUNTER — Other Ambulatory Visit: Payer: Self-pay

## 2021-04-15 ENCOUNTER — Emergency Department (HOSPITAL_COMMUNITY): Payer: 59

## 2021-04-15 DIAGNOSIS — I10 Essential (primary) hypertension: Secondary | ICD-10-CM | POA: Diagnosis not present

## 2021-04-15 DIAGNOSIS — Z794 Long term (current) use of insulin: Secondary | ICD-10-CM | POA: Diagnosis not present

## 2021-04-15 DIAGNOSIS — E119 Type 2 diabetes mellitus without complications: Secondary | ICD-10-CM | POA: Insufficient documentation

## 2021-04-15 DIAGNOSIS — N23 Unspecified renal colic: Secondary | ICD-10-CM | POA: Diagnosis not present

## 2021-04-15 DIAGNOSIS — I2510 Atherosclerotic heart disease of native coronary artery without angina pectoris: Secondary | ICD-10-CM

## 2021-04-15 DIAGNOSIS — R109 Unspecified abdominal pain: Secondary | ICD-10-CM | POA: Diagnosis present

## 2021-04-15 LAB — BASIC METABOLIC PANEL
Anion gap: 9 (ref 5–15)
BUN: 18 mg/dL (ref 6–20)
CO2: 26 mmol/L (ref 22–32)
Calcium: 9.3 mg/dL (ref 8.9–10.3)
Chloride: 98 mmol/L (ref 98–111)
Creatinine, Ser: 0.92 mg/dL (ref 0.61–1.24)
GFR, Estimated: >60 mL/min (ref >60)
Glucose, Bld: 289 mg/dL — ABNORMAL HIGH (ref 70–99)
Potassium: 4.3 mmol/L (ref 3.5–5.1)
Sodium: 133 mmol/L — ABNORMAL LOW (ref 135–145)

## 2021-04-15 LAB — CBC WITH DIFFERENTIAL/PLATELET
Abs Immature Granulocytes: 0.02 10*3/uL (ref 0.00–0.07)
Basophils Absolute: 0 10*3/uL (ref 0.0–0.1)
Basophils Relative: 1 %
Eosinophils Absolute: 0.2 10*3/uL (ref 0.0–0.5)
Eosinophils Relative: 3 %
HCT: 41.4 % (ref 39.0–52.0)
Hemoglobin: 14.2 g/dL (ref 13.0–17.0)
Immature Granulocytes: 0 %
Lymphocytes Relative: 25 %
Lymphs Abs: 1.7 10*3/uL (ref 0.7–4.0)
MCH: 30.5 pg (ref 26.0–34.0)
MCHC: 34.3 g/dL (ref 30.0–36.0)
MCV: 88.8 fL (ref 80.0–100.0)
Monocytes Absolute: 0.5 10*3/uL (ref 0.1–1.0)
Monocytes Relative: 8 %
Neutro Abs: 4.5 10*3/uL (ref 1.7–7.7)
Neutrophils Relative %: 63 %
Platelets: 183 10*3/uL (ref 150–400)
RBC: 4.66 MIL/uL (ref 4.22–5.81)
RDW: 11.7 % (ref 11.5–15.5)
WBC: 6.9 10*3/uL (ref 4.0–10.5)
nRBC: 0 % (ref 0.0–0.2)

## 2021-04-15 LAB — URINALYSIS, ROUTINE W REFLEX MICROSCOPIC
Bilirubin Urine: NEGATIVE
Glucose, UA: 50 mg/dL — AB
Ketones, ur: 5 mg/dL — AB
Leukocytes,Ua: NEGATIVE
Nitrite: NEGATIVE
Protein, ur: 30 mg/dL — AB
RBC / HPF: >50 RBC/hpf — ABNORMAL HIGH (ref 0–5)
Specific Gravity, Urine: 1.018 (ref 1.005–1.030)
pH: 5 (ref 5.0–8.0)

## 2021-04-15 MED ORDER — FENTANYL CITRATE (PF) 100 MCG/2ML IJ SOLN
50.0000 ug | Freq: Once | INTRAMUSCULAR | Status: AC
  Administered 2021-04-15: 50 ug via INTRAVENOUS
  Filled 2021-04-15: qty 2

## 2021-04-15 MED ORDER — KETOROLAC TROMETHAMINE 30 MG/ML IJ SOLN
30.0000 mg | Freq: Once | INTRAMUSCULAR | Status: AC
  Administered 2021-04-15: 30 mg via INTRAVENOUS
  Filled 2021-04-15: qty 1

## 2021-04-15 MED ORDER — SODIUM CHLORIDE 0.9 % IV SOLN
12.5000 mg | Freq: Once | INTRAVENOUS | Status: AC
  Administered 2021-04-15: 12.5 mg via INTRAVENOUS
  Filled 2021-04-15: qty 12.5

## 2021-04-15 MED ORDER — TAMSULOSIN HCL 0.4 MG PO CAPS: 0.4000 mg | ORAL_CAPSULE | Freq: Every day | ORAL | 0 refills | Status: DC

## 2021-04-15 MED ORDER — HYDROMORPHONE HCL 1 MG/ML IJ SOLN
1.0000 mg | Freq: Once | INTRAMUSCULAR | Status: AC
  Administered 2021-04-15: 1 mg via INTRAVENOUS
  Filled 2021-04-15: qty 1

## 2021-04-15 MED ORDER — OXYCODONE-ACETAMINOPHEN 5-325 MG PO TABS: 1.0000 | ORAL_TABLET | Freq: Four times a day (QID) | ORAL | 0 refills | Status: DC | PRN

## 2021-04-15 MED ORDER — ONDANSETRON 4 MG PO TBDP: 4.0000 mg | ORAL_TABLET | Freq: Three times a day (TID) | ORAL | 0 refills | Status: DC | PRN

## 2021-04-15 MED ORDER — SODIUM CHLORIDE 0.9 % IV BOLUS
1000.0000 mL | Freq: Once | INTRAVENOUS | Status: AC
  Administered 2021-04-15: 1000 mL via INTRAVENOUS

## 2021-04-15 MED ORDER — IBUPROFEN 600 MG PO TABS: 600.0000 mg | ORAL_TABLET | Freq: Three times a day (TID) | ORAL | 0 refills | Status: DC | PRN

## 2021-04-15 MED ORDER — ONDANSETRON HCL 4 MG/2ML IJ SOLN
4.0000 mg | Freq: Once | INTRAMUSCULAR | Status: AC
  Administered 2021-04-15: 4 mg via INTRAVENOUS
  Filled 2021-04-15: qty 2

## 2021-04-15 NOTE — Discharge Instructions (Addendum)
Take the pain and nausea medication as prescribed.  Follow-up with the urologist.  Return to the ED with intractable pain, intractable vomiting, not able to urinate, fever, any other concerns.  The CT scan incidentally showed some coronary calcifications.  Please discuss this finding with your primary doctor and discuss long-term monitoring.

## 2021-04-15 NOTE — ED Provider Notes (Signed)
[facility] DEPT Provider Note   CSN: [id number] Arrival date & time: 04/15/21  0453     History Chief Complaint  Patient presents with  . Flank Pain    Douglas Phillips is a 45 y.o. male.  Patient presents with left-sided flank pain and concern for kidney stone.  States he has had kidney stones in the past but not for several years.  Developed brief episode of pain several days ago that resolved after 20 minutes.  Today he was woken from sleep about 2:45 AM with severe left-sided flank pain and nausea and vomiting.  Pain comes and goes in waves.  Vomited once at home and once here.  No fever.  No diarrhea.  Some pain with urination and dysuria and frequency.  No hematuria.  No testicular pain.  No chest pain or shortness of breath.  No previous abdominal surgeries.  Still able to urinate but states it is very slow.  The history is provided by the patient. The history is limited by the condition of the patient.  Flank Pain Associated symptoms include abdominal pain. Pertinent negatives include no chest pain, no headaches and no shortness of breath.       Past Medical History:  Diagnosis Date  . Diabetes mellitus without complication ([place])   . High cholesterol   . Hypertension   . Kidney stones   . Migraine   . MIGRAINE HEADACHE 04/05/2007   Qualifier: Diagnosis of  By: Larose Kells MD, [place] sleep 03/12/2019    Patient Active Problem List   Diagnosis Date Noted  . Nocturia more than twice per night 03/06/2021  . Erectile dysfunction 03/06/2021  . OSA on CPAP 03/12/2019  . Retrognathia 03/12/2019  . Recent weight loss 03/12/2019  . DM (diabetes mellitus) ([place]) 02/01/2019  . Dyslipidemia associated with type 2 diabetes mellitus ([place]) 02/01/2019  . Migraine 02/01/2019  . OSA (obstructive sleep apnea) 01/22/2010  . NEPHROLITHIASIS, HX OF 04/05/2007    Past Surgical History:  Procedure Laterality Date  . BACK SURGERY      ruptured L4 and L5  . MOUTH SURGERY         Family History  Problem Relation Age of Onset  . Osteoarthritis Mother   . Hypertension Father   . Liver cancer Maternal Grandmother   . Hypertension Paternal Grandfather   . Heart attack Paternal Grandfather   . Colon cancer Neg Hx   . Prostate cancer Neg Hx     Social History   Tobacco Use  . Smoking status: Never Smoker  . Smokeless tobacco: Never Used  Vaping Use  . Vaping Use: Never used  Substance Use Topics  . Alcohol use: No  . Drug use: No    Home Medications Prior to Admission medications   Medication Sig Start Date End Date Taking? Authorizing Provider  blood glucose meter kit and supplies Dispense based on patient and insurance preference. Use one time daily. ICD10: E11.65 Patient not taking: Reported on 04/07/2021 03/06/21   Nche, Charlene Brooke, NP  fenofibrate micronized (LOFIBRA) 134 MG capsule Take 1 capsule (134 mg total) by mouth daily before breakfast. Patient not taking: Reported on 04/07/2021 03/06/21 03/01/22  Nche, Charlene Brooke, NP  insulin NPH-regular Human (HUMULIN 70/30) (70-30) 100 UNIT/ML injection Inject 10 Units into the skin 2 (two) times daily with a meal. Increase by 3units every 5days if fasting glucose is >150 04/07/21   Nche, Charlene Brooke, NP  Insulin Pen Needle (  PEN NEEDLES) 31G X 6 MM MISC 1 application by Does not apply route once a week. For levemir pen Patient not taking: Reported on 04/07/2021 03/16/21   Nche, Charlene Brooke, NP  pioglitazone (ACTOS) 15 MG tablet Take 1 tablet (15 mg total) by mouth daily. 03/26/21   Nche, Charlene Brooke, NP  Syringe/Needle, Disp, (SYRINGE 3CC/20GX1") 20G X 1" 3 ML MISC 1 Units by Does not apply route 2 (two) times daily with a meal. 04/07/21   Nche, Charlene Brooke, NP    Allergies    Patient has no known allergies.  Review of Systems   Review of Systems  Constitutional: Negative for activity change, appetite change and fever.  HENT: Negative for congestion and  sinus pain.   Respiratory: Negative for cough, chest tightness and shortness of breath.   Cardiovascular: Negative for chest pain.  Gastrointestinal: Positive for abdominal pain, nausea and vomiting.  Genitourinary: Positive for dysuria, flank pain, frequency and urgency.  Musculoskeletal: Positive for back pain. Negative for myalgias.  Skin: Negative for rash.  Neurological: Negative for dizziness, weakness and headaches.   all other systems are negative except as noted in the HPI and PMH.    Physical Exam Updated Vital Signs BP (!) 173/95   Pulse 65   Temp 98.2 F (36.8 C)   Resp 18   Ht _0  (1.702 m)   Wt 88 kg   SpO2 98%   BMI 30.39 kg/m   Physical Exam Vitals and nursing note reviewed.  Constitutional:      General: He is not in acute distress.    Appearance: He is well-developed.     Comments: Actively vomiting  HENT:     Head: Normocephalic and atraumatic.     Mouth/Throat:     Pharynx: No oropharyngeal exudate.  Eyes:     Conjunctiva/sclera: Conjunctivae normal.     Pupils: Pupils are equal, round, and reactive to light.  Neck:     Comments: No meningismus. Cardiovascular:     Rate and Rhythm: Normal rate and regular rhythm.     Heart sounds: Normal heart sounds. No murmur heard.   Pulmonary:     Effort: Pulmonary effort is normal. No respiratory distress.     Breath sounds: Normal breath sounds.  Abdominal:     Palpations: Abdomen is soft.     Tenderness: There is no abdominal tenderness. There is no guarding or rebound.  Musculoskeletal:        General: Tenderness present. Normal range of motion.     Cervical back: Normal range of motion and neck supple.     Comments: Left CVA tender  Skin:    General: Skin is warm.  Neurological:     Mental Status: He is alert and oriented to person, place, and time.     Cranial Nerves: No cranial nerve deficit.     Motor: No abnormal muscle tone.     Coordination: Coordination normal.     Comments: No ataxia  on finger to nose bilaterally. No pronator drift. 5/5 strength throughout. CN 2-12 intact.Equal grip strength. Sensation intact.   Psychiatric:        Behavior: Behavior normal.     ED Results / Procedures / Treatments   Labs (all labs ordered are listed, but only abnormal results are displayed) Labs Reviewed  URINALYSIS, ROUTINE W REFLEX MICROSCOPIC - Abnormal; Notable for the following components:      Result Value   APPearance HAZY (*)    Glucose, UA  50 (*)    Hgb urine dipstick LARGE (*)    Ketones, ur 5 (*)    Protein, ur 30 (*)    RBC / HPF >50 (*)    Bacteria, UA RARE (*)    All other components within normal limits  BASIC METABOLIC PANEL - Abnormal; Notable for the following components:   Sodium 133 (*)    Glucose, Bld 289 (*)    All other components within normal limits  CBC WITH DIFFERENTIAL/PLATELET    EKG None  Radiology CT Renal Stone Study  Result Date: 04/15/2021 CLINICAL DATA:  Flank pain with kidney stone suspected EXAM: CT ABDOMEN AND PELVIS WITHOUT CONTRAST TECHNIQUE: Multidetector CT imaging of the abdomen and pelvis was performed following the standard protocol without IV contrast. COMPARISON:  03/19/2006 FINDINGS: Lower chest:  Coronary atherosclerosis. Hepatobiliary: No focal liver abnormality.No evidence of biliary obstruction or stone. Pancreas: Atrophic appearance for age. Spleen: Unremarkable. Adrenals/Urinary Tract: Negative adrenals. 3 mm left UPJ calculus without hydronephrosis. No additional urolithiasis. Unremarkable bladder. Stomach/Bowel:  No obstruction. No visible inflammation. Vascular/Lymphatic: No acute vascular abnormality. No mass or adenopathy. Reproductive:Negative. Other: No ascites or pneumoperitoneum. Musculoskeletal: No acute abnormalities. IMPRESSION: 1. Nonobstructing 3 mm left UPJ calculus. 2. Multifocal coronary atherosclerotic calcification, premature for age. Electronically Signed   By: Monte Fantasia M.D.   On: 04/15/2021 06:18     Procedures Procedures   Medications Ordered in ED Medications  sodium chloride 0.9 % bolus 1,000 mL (has no administration in time range)  HYDROmorphone (DILAUDID) injection 1 mg (has no administration in time range)  ketorolac (TORADOL) 30 MG/ML injection 30 mg (has no administration in time range)  ondansetron (ZOFRAN) injection 4 mg (has no administration in time range)    ED Course  I have reviewed the triage vital signs and the nursing notes.  Pertinent labs & imaging results that were available during my care of the patient were reviewed by me and considered in my medical decision making (see chart for details).    MDM Rules/Calculators/A&P                         Flank pain with nausea and vomiting concern for kidney stone.  No fever  Abdomen soft, no peritoneal signs.  UA with blood, no infection.  Creatinine is normal.  CT confirms 3 mm left UPJ stone.  On recheck, patient's pain is improved and nausea is improved.  Will attempt p.o. challenge.  Discussed outpatient follow-up when pain and nausea can be controlled.  Return to the ED with intractable pain, intractable nausea and vomiting, fever, unable to urinate or any other concerns. Final Clinical Impression(s) / ED Diagnoses Final diagnoses:  Ureteral colic    Rx / DC Orders ED Discharge Orders    None       Roshun Klingensmith, Annie Main, MD 04/15/21 [phone]

## 2021-04-15 NOTE — ED Notes (Signed)
rx sent to pharmacy, pt advised to follow up w PCP and Urologist, come back to ED for any worsening symptoms. Pt understood, a&o x 4, steady gait, wife driving pt home, pt ready for discharge

## 2021-04-15 NOTE — ED Notes (Signed)
Pt states he drank a little water for PO challenge, pt denies n/v, bt states water "made his stomach hurt". Provider made aware

## 2021-04-15 NOTE — ED Notes (Signed)
Patient was given a cup of water. 

## 2021-04-15 NOTE — ED Provider Notes (Signed)
45 year old male with concern for kidney stone.  Found to have a 3 mm nonobstructing stone at the left UPJ.  UA negative.  Labs stable.  7:00 AM Received signout from Dr. Manus Gunning  7:30 AM Reassessed patient, had slight vomiting and some increase in pain, will give additional antiemetics and pain medicine  8:33 AM Symptoms improved, has tolerated p.o., will discharge home.  For the kidney stone recommended follow-up with his urologist.  Discussed the incidental finding of coronary calcifications and recommended he follow-up with his primary care doctor to discuss appropriate long-term monitoring.     Milagros Loll, MD 04/15/21 [phone]

## 2021-04-15 NOTE — ED Triage Notes (Signed)
Pt c/o L sided flank pain starting Saturday getting significantly worse this morning. Reports n/v but denies fevers. Hx kidney stones

## 2021-04-17 ENCOUNTER — Encounter (HOSPITAL_COMMUNITY): Payer: Self-pay

## 2021-04-17 ENCOUNTER — Emergency Department (HOSPITAL_COMMUNITY): Payer: 59

## 2021-04-17 ENCOUNTER — Emergency Department (HOSPITAL_COMMUNITY)
Admission: EM | Admit: 2021-04-17 | Discharge: 2021-04-17 | Disposition: A | Discharge status: Home / Self Care | Payer: 59 | Attending: Emergency Medicine | Admitting: Emergency Medicine

## 2021-04-17 ENCOUNTER — Other Ambulatory Visit: Payer: Self-pay

## 2021-04-17 DIAGNOSIS — Z794 Long term (current) use of insulin: Secondary | ICD-10-CM | POA: Diagnosis not present

## 2021-04-17 DIAGNOSIS — E1165 Type 2 diabetes mellitus with hyperglycemia: Secondary | ICD-10-CM | POA: Diagnosis not present

## 2021-04-17 DIAGNOSIS — I10 Essential (primary) hypertension: Secondary | ICD-10-CM | POA: Diagnosis not present

## 2021-04-17 DIAGNOSIS — R109 Unspecified abdominal pain: Secondary | ICD-10-CM | POA: Diagnosis present

## 2021-04-17 DIAGNOSIS — Z7984 Long term (current) use of oral hypoglycemic drugs: Secondary | ICD-10-CM | POA: Diagnosis not present

## 2021-04-17 LAB — CBC WITH DIFFERENTIAL/PLATELET
Abs Immature Granulocytes: 0.03 10*3/uL (ref 0.00–0.07)
Basophils Absolute: 0 10*3/uL (ref 0.0–0.1)
Basophils Relative: 0 %
Eosinophils Absolute: 0.1 10*3/uL (ref 0.0–0.5)
Eosinophils Relative: 1 %
HCT: 39.2 % (ref 39.0–52.0)
Hemoglobin: 13.2 g/dL (ref 13.0–17.0)
Immature Granulocytes: 0 %
Lymphocytes Relative: 17 %
Lymphs Abs: 1.3 10*3/uL (ref 0.7–4.0)
MCH: 30.4 pg (ref 26.0–34.0)
MCHC: 33.7 g/dL (ref 30.0–36.0)
MCV: 90.3 fL (ref 80.0–100.0)
Monocytes Absolute: 0.6 10*3/uL (ref 0.1–1.0)
Monocytes Relative: 8 %
Neutro Abs: 5.4 10*3/uL (ref 1.7–7.7)
Neutrophils Relative %: 74 %
Platelets: 178 10*3/uL (ref 150–400)
RBC: 4.34 MIL/uL (ref 4.22–5.81)
RDW: 11.6 % (ref 11.5–15.5)
WBC: 7.4 10*3/uL (ref 4.0–10.5)
nRBC: 0 % (ref 0.0–0.2)

## 2021-04-17 LAB — URINALYSIS, ROUTINE W REFLEX MICROSCOPIC
Bacteria, UA: NONE SEEN
Bilirubin Urine: NEGATIVE
Glucose, UA: >=500 mg/dL — AB
Ketones, ur: 80 mg/dL — AB
Leukocytes,Ua: NEGATIVE
Nitrite: NEGATIVE
Protein, ur: NEGATIVE mg/dL
RBC / HPF: >50 RBC/hpf — ABNORMAL HIGH (ref 0–5)
Specific Gravity, Urine: 1.021 (ref 1.005–1.030)
pH: 5 (ref 5.0–8.0)

## 2021-04-17 LAB — BASIC METABOLIC PANEL
Anion gap: 12 (ref 5–15)
BUN: 17 mg/dL (ref 6–20)
CO2: 24 mmol/L (ref 22–32)
Calcium: 9.6 mg/dL (ref 8.9–10.3)
Chloride: 103 mmol/L (ref 98–111)
Creatinine, Ser: 1.4 mg/dL — ABNORMAL HIGH (ref 0.61–1.24)
GFR, Estimated: >60 mL/min (ref >60)
Glucose, Bld: 248 mg/dL — ABNORMAL HIGH (ref 70–99)
Potassium: 4.9 mmol/L (ref 3.5–5.1)
Sodium: 139 mmol/L (ref 135–145)

## 2021-04-17 LAB — CBG MONITORING, ED: Glucose-Capillary: 200 mg/dL — ABNORMAL HIGH (ref 70–99)

## 2021-04-17 MED ORDER — HYDROMORPHONE HCL 1 MG/ML IJ SOLN
1.0000 mg | Freq: Once | INTRAMUSCULAR | Status: AC
  Administered 2021-04-17: 1 mg via INTRAVENOUS
  Filled 2021-04-17: qty 1

## 2021-04-17 MED ORDER — SODIUM CHLORIDE 0.9 % IV SOLN
25.0000 mg | Freq: Four times a day (QID) | INTRAVENOUS | Status: DC | PRN
  Administered 2021-04-17: 25 mg via INTRAVENOUS
  Filled 2021-04-17: qty 25

## 2021-04-17 MED ORDER — PROMETHAZINE HCL 25 MG PO TABS: 25.0000 mg | ORAL_TABLET | Freq: Four times a day (QID) | ORAL | 0 refills | Status: DC | PRN

## 2021-04-17 MED ORDER — KETOROLAC TROMETHAMINE 30 MG/ML IJ SOLN
30.0000 mg | Freq: Once | INTRAMUSCULAR | Status: AC
  Administered 2021-04-17: 30 mg via INTRAVENOUS
  Filled 2021-04-17: qty 1

## 2021-04-17 MED ORDER — SODIUM CHLORIDE 0.9 % IV BOLUS
1000.0000 mL | Freq: Once | INTRAVENOUS | Status: AC
  Administered 2021-04-17: 1000 mL via INTRAVENOUS

## 2021-04-17 MED ORDER — PROMETHAZINE HCL 25 MG RE SUPP: 25.0000 mg | Freq: Four times a day (QID) | RECTAL | 0 refills | Status: DC | PRN

## 2021-04-17 MED ORDER — ONDANSETRON HCL 4 MG/2ML IJ SOLN
4.0000 mg | Freq: Once | INTRAMUSCULAR | Status: AC
  Administered 2021-04-17: 4 mg via INTRAVENOUS
  Filled 2021-04-17: qty 2

## 2021-04-17 MED ORDER — OXYCODONE-ACETAMINOPHEN 5-325 MG PO TABS: 1.0000 | ORAL_TABLET | Freq: Four times a day (QID) | ORAL | 0 refills | Status: DC | PRN

## 2021-04-17 NOTE — ED Triage Notes (Signed)
Pt was here Tuesday night for L flank pain, states his kidney stone has not passed yet and pain is unbearable. Took oxycodone at 2 am.

## 2021-04-17 NOTE — ED Provider Notes (Signed)
[facility] DEPT Provider Note   CSN: [id number] Arrival date & time: 04/17/21  0806     History Chief Complaint  Patient presents with  . Flank Pain    Douglas Phillips is a 45 y.o. male.  45 year old male with history of insulin-dependent diabetes, hypertension, kidney stones presents with complaint of ongoing left flank pain.  Patient was seen in this ED 2 days ago, diagnosed with 3 mm nonobstructing left UPJ stone, discharged home on oxycodone and Zofran.  Patient reports pain and vomiting not controlled.  Patient has not been able to eat a few crackers and thus has not been taking his insulin.  Denies fevers, dysuria, does report decreased urinary output..  Patient has a urologist however has not followed up.        Past Medical History:  Diagnosis Date  . Diabetes mellitus without complication ([place])   . High cholesterol   . Hypertension   . Kidney stones   . Migraine   . MIGRAINE HEADACHE 04/05/2007   Qualifier: Diagnosis of  By: Larose Kells MD, [place] sleep 03/12/2019    Patient Active Problem List   Diagnosis Date Noted  . Nocturia more than twice per night 03/06/2021  . Erectile dysfunction 03/06/2021  . OSA on CPAP 03/12/2019  . Retrognathia 03/12/2019  . Recent weight loss 03/12/2019  . DM (diabetes mellitus) ([place]) 02/01/2019  . Dyslipidemia associated with type 2 diabetes mellitus ([place]) 02/01/2019  . Migraine 02/01/2019  . OSA (obstructive sleep apnea) 01/22/2010  . NEPHROLITHIASIS, HX OF 04/05/2007    Past Surgical History:  Procedure Laterality Date  . BACK SURGERY     ruptured L4 and L5  . MOUTH SURGERY         Family History  Problem Relation Age of Onset  . Osteoarthritis Mother   . Hypertension Father   . Liver cancer Maternal Grandmother   . Hypertension Paternal Grandfather   . Heart attack Paternal Grandfather   . Colon cancer Neg Hx   . Prostate cancer Neg Hx     Social History    Tobacco Use  . Smoking status: Never Smoker  . Smokeless tobacco: Never Used  Vaping Use  . Vaping Use: Never used  Substance Use Topics  . Alcohol use: No  . Drug use: No    Home Medications Prior to Admission medications   Medication Sig Start Date End Date Taking? Authorizing Provider  promethazine (PHENERGAN) 25 MG suppository Place 1 suppository (25 mg total) rectally every 6 (six) hours as needed for nausea or vomiting. 04/17/21  Yes Tacy Learn, PA-C  promethazine (PHENERGAN) 25 MG tablet Take 1 tablet (25 mg total) by mouth every 6 (six) hours as needed for nausea or vomiting. 04/17/21  Yes Tacy Learn, PA-C  blood glucose meter kit and supplies Dispense based on patient and insurance preference. Use one time daily. ICD10: E11.65 Patient not taking: Reported on 04/07/2021 03/06/21   Nche, Charlene Brooke, NP  fenofibrate micronized (LOFIBRA) 134 MG capsule Take 1 capsule (134 mg total) by mouth daily before breakfast. Patient not taking: Reported on 04/07/2021 03/06/21 03/01/22  Nche, Charlene Brooke, NP  ibuprofen (ADVIL) 600 MG tablet Take 1 tablet (600 mg total) by mouth every 8 (eight) hours as needed for moderate pain. 04/15/21   Rancour, Annie Main, MD  insulin NPH-regular Human (HUMULIN 70/30) (70-30) 100 UNIT/ML injection Inject 10 Units into the skin 2 (two) times daily with a meal. Increase  by 3units every 5days if fasting glucose is >150 04/07/21   Nche, Charlene Brooke, NP  Insulin Pen Needle (PEN NEEDLES) 31G X 6 MM MISC 1 application by Does not apply route once a week. For levemir pen Patient not taking: Reported on 04/07/2021 03/16/21   Nche, Charlene Brooke, NP  ondansetron (ZOFRAN ODT) 4 MG disintegrating tablet Take 1 tablet (4 mg total) by mouth every 8 (eight) hours as needed for nausea or vomiting. 04/15/21   Rancour, Annie Main, MD  oxyCODONE-acetaminophen (PERCOCET/ROXICET) 5-325 MG tablet Take 1 tablet by mouth every 6 (six) hours as needed for severe pain. 04/15/21   Rancour,  Annie Main, MD  pioglitazone (ACTOS) 15 MG tablet Take 1 tablet (15 mg total) by mouth daily. 03/26/21   Nche, Charlene Brooke, NP  Syringe/Needle, Disp, (SYRINGE 3CC/20GX1") 20G X 1" 3 ML MISC 1 Units by Does not apply route 2 (two) times daily with a meal. 04/07/21   Nche, Charlene Brooke, NP  tamsulosin (FLOMAX) 0.4 MG CAPS capsule Take 1 capsule (0.4 mg total) by mouth daily. 04/15/21   Ezequiel Essex, MD    Allergies    Patient has no known allergies.  Review of Systems   Review of Systems  Constitutional: Negative for chills and fever.  Respiratory: Negative for shortness of breath.   Cardiovascular: Negative for chest pain.  Gastrointestinal: Positive for abdominal pain, nausea and vomiting. Negative for constipation and diarrhea.  Genitourinary: Positive for decreased urine volume and flank pain.  Musculoskeletal: Negative for arthralgias and myalgias.  Skin: Negative for rash and wound.  Allergic/Immunologic: Positive for immunocompromised state.  Neurological: Negative for weakness.  Psychiatric/Behavioral: Negative for confusion.  All other systems reviewed and are negative.   Physical Exam Updated Vital Signs BP (!) 150/88   Pulse (!) 54   Temp 98.1 F (36.7 C) (Oral)   Resp 18   Ht 5' 7"  (1.702 m)   Wt 88.5 kg   SpO2 97%   BMI 30.54 kg/m   Physical Exam Vitals and nursing note reviewed.  Constitutional:      General: He is not in acute distress.    Appearance: He is well-developed. He is obese. He is not diaphoretic.  HENT:     Head: Normocephalic and atraumatic.  Cardiovascular:     Rate and Rhythm: Normal rate and regular rhythm.     Pulses: Normal pulses.     Heart sounds: Normal heart sounds.  Pulmonary:     Effort: Pulmonary effort is normal.     Breath sounds: Normal breath sounds.  Abdominal:     Palpations: Abdomen is soft.     Tenderness: There is abdominal tenderness. There is no right CVA tenderness or left CVA tenderness.  Musculoskeletal:      Right lower leg: No edema.     Left lower leg: No edema.  Skin:    General: Skin is warm and dry.     Findings: No erythema or rash.  Neurological:     Mental Status: He is alert and oriented to person, place, and time.  Psychiatric:        Behavior: Behavior normal.     ED Results / Procedures / Treatments   Labs (all labs ordered are listed, but only abnormal results are displayed) Labs Reviewed  BASIC METABOLIC PANEL - Abnormal; Notable for the following components:      Result Value   Glucose, Bld 248 (*)    Creatinine, Ser 1.40 (*)    All other components  within normal limits  URINALYSIS, ROUTINE W REFLEX MICROSCOPIC - Abnormal; Notable for the following components:   Glucose, UA >=500 (*)    Hgb urine dipstick LARGE (*)    Ketones, ur 80 (*)    RBC / HPF >50 (*)    All other components within normal limits  CBG MONITORING, ED - Abnormal; Notable for the following components:   Glucose-Capillary 200 (*)    All other components within normal limits  CBC WITH DIFFERENTIAL/PLATELET    EKG None  Radiology US Renal  Result Date: 04/17/2021 CLINICAL DATA:  History of a left UPJ stone by recent CT scan. EXAM: RENAL / URINARY TRACT ULTRASOUND COMPLETE COMPARISON:  CT abdomen and pelvis 04/15/2021. FINDINGS: Right Kidney: Renal measurements: 11.5 x 6.1 x 6.7 cm = volume: 246.2 mL. Echogenicity within normal limits. No mass or hydronephrosis visualized. Left Kidney: Renal measurements: 13.4 x 6.7 x 6.0 cm = volume: 278.4 mL. Echogenicity within normal limits. No mass or hydronephrosis visualized. Bladder: Appears normal for degree of bladder distention. Other: None. IMPRESSION: Normal exam.  No stone is seen.  Negative for hydronephrosis. Electronically Signed   By: Inge Rise M.D.   On: 04/17/2021 11:38    Procedures Procedures   Medications Ordered in ED Medications  promethazine (PHENERGAN) 25 mg in sodium chloride 0.9 % 50 mL IVPB (0 mg Intravenous Stopped 04/17/21  1007)  HYDROmorphone (DILAUDID) injection 1 mg (1 mg Intravenous Given 04/17/21 0851)  ondansetron (ZOFRAN) injection 4 mg (4 mg Intravenous Given 04/17/21 0850)  sodium chloride 0.9 % bolus 1,000 mL (0 mLs Intravenous Stopped 04/17/21 1040)  ketorolac (TORADOL) 30 MG/ML injection 30 mg (30 mg Intravenous Given 04/17/21 1139)    ED Course  I have reviewed the triage vital signs and the nursing notes.  Pertinent labs & imaging results that were available during my care of the patient were reviewed by me and considered in my medical decision making (see chart for details).  Clinical Course as of 04/17/21 1249  Fri Apr 18, 3971  4650 45 year old male with history of 3 mm left UPJ stone return to the emergency room with ongoing flank pain and vomiting. On exam, found to have generalized abdominal tenderness, no CVA tenderness. Review of prior record.  Labs with slight increase in creatinine to 1.4 today, suspect this is due to his vomiting and dehydration.  Glucose is elevated at 248.  Patient was given IV fluids with improvement in glucose to 200.  Not in DKA, normal bicarbonate.  CBC within normal limits.  Urinalysis with large ketones and blood. Renal ultrasound does not show hydronephrosis to suggest obstructing pathology at this point.  Recommend patient follow-up with urology, given prescription for Phenergan tablets and suppositories as the Zofran ODT is not helping at home. Patient has Flomax at home which she is taking as well as pain medication. Patient's pain today has improved after dose of Toradol, he is tolerating oral fluids.  [LM]    Clinical Course User Index [LM] Roque Lias   MDM Rules/Calculators/A&P                          Final Clinical Impression(s) / ED Diagnoses Final diagnoses:  Left flank pain  Hyperglycemia due to diabetes mellitus ([place])    Rx / DC Orders ED Discharge Orders         Ordered    promethazine (PHENERGAN) 25 MG suppository  Every 6 hours  PRN  04/17/21 1245    promethazine (PHENERGAN) 25 MG tablet  Every 6 hours PRN        04/17/21 1245           Roque Lias 04/17/21 1249    Dorie Rank, MD 04/18/21 [phone]

## 2021-04-17 NOTE — Discharge Instructions (Addendum)
Use your glucometer to monitor your blood sugar at home and dose insulin accordingly. Use Phenergan suppositories if unable to keep tablets down by mouth. Follow-up with urology, call today to schedule an appointment.

## 2021-05-12 ENCOUNTER — Other Ambulatory Visit: Payer: Self-pay

## 2021-05-13 ENCOUNTER — Other Ambulatory Visit: Payer: Self-pay | Admitting: Nurse Practitioner

## 2021-05-13 ENCOUNTER — Ambulatory Visit (INDEPENDENT_AMBULATORY_CARE_PROVIDER_SITE_OTHER): Payer: 59 | Admitting: Nurse Practitioner

## 2021-05-13 ENCOUNTER — Encounter: Payer: Self-pay | Admitting: Nurse Practitioner

## 2021-05-13 VITALS — BP 130/84 | HR 84 | Temp 97.3°F | Ht 67.0 in | Wt 188.4 lb

## 2021-05-13 DIAGNOSIS — E1165 Type 2 diabetes mellitus with hyperglycemia: Secondary | ICD-10-CM | POA: Diagnosis not present

## 2021-05-13 DIAGNOSIS — I2510 Atherosclerotic heart disease of native coronary artery without angina pectoris: Secondary | ICD-10-CM

## 2021-05-13 DIAGNOSIS — Z794 Long term (current) use of insulin: Secondary | ICD-10-CM | POA: Diagnosis not present

## 2021-05-13 DIAGNOSIS — E781 Pure hyperglyceridemia: Secondary | ICD-10-CM

## 2021-05-13 MED ORDER — HUMULIN 70/30 (70-30) 100 UNIT/ML ~~LOC~~ SUSP: 13.0000 [IU] | Freq: Two times a day (BID) | SUBCUTANEOUS | 5 refills | Status: DC

## 2021-05-13 NOTE — Assessment & Plan Note (Addendum)
Advised to start fenofibrate due to elevated triglycerides Add atorvastatin 10mg  due to multifocal CAD per CT report Continue DASH diet

## 2021-05-13 NOTE — Assessment & Plan Note (Addendum)
Uncontrolled with AM and PM glucose >250 on average. Current use of humulin 70/30 10units BID He states he was unaware to titrate insulin dose. Continue weight loss and positive ketones in urine.  Advised him about possible organ damage with prolonged uncontrolled DM. Advised about the need for basal insulin or SGLT-2 or DPP-4 or GLP-1 in combination with humulin to achieve better glucose control. States he is still unable to afford any of these medications. Advised to search for coupons/savings cards under the pharmaceutical website. He verbalized understanding. For now, he is to titrate humulin up 3units every 3days to get glucose <150. Resume use of actos as well Add glipizide 5mg  F/up in 56month

## 2021-05-13 NOTE — Progress Notes (Addendum)
Subjective:  Patient ID: Douglas Phillips, male    DOB: 1976-01-02  Age: 45 y.o. MRN: [id number]  CC: Follow-up (4 week f/u on DM. Pt states over the past week he has noticed that his blood sugars have been in the 200s and that is higher than they were in the beginning. Pt states he has not changed his diet and is still eating healthy )  HPI  DM (diabetes mellitus) ([place]) Uncontrolled with AM and PM glucose >250 on average. Current use of humulin 70/30 10units BID He states he was unaware to titrate insulin dose. Continue weight loss and positive ketones in urine.  Advised him about possible organ damage with prolonged uncontrolled DM. Advised about the need for basal insulin or SGLT-2 or DPP-4 or GLP-1 in combination with humulin to achieve better glucose control. States he is still unable to afford any of these medications. Advised to search for coupons/savings cards under the pharmaceutical website. He verbalized understanding. For now, he is to titrate humulin up 3units every 3days to get glucose <150. Resume use of actos as well Add glipizide 24m F/up in 153monthHypertriglyceridemia Advised to start fenofibrate due to elevated triglycerides Add atorvastatin 1039mue to multifocal CAD per CT report Continue DASH diet  Coronary artery disease involving native heart without angina pectoris asymptomatic Per CT report 04/2021:Multifocal coronary atherosclerotic calcification, premature for Age. Uncontrolled DM LDL not at goal Improved BP No tobacco use, no ETOH use, no illicit drug use BP Readings from Last 3 Encounters:  05/13/21 130/84  04/17/21 (!) 150/88  04/15/21 (!) 149/83   Lipid Panel     Component Value Date/Time   CHOL 176 03/06/2021 1052   TRIG 261.0 (H) 03/06/2021 1052   HDL 32.40 (L) 03/06/2021 1052   CHOLHDL 5 03/06/2021 1052   VLDL 52.2 (H) 03/06/2021 1052   LDLDIRECT 100.0 03/06/2021 1052   Ordered Ct coronary calcium eval   Wt Readings from Last 3  Encounters:  05/13/21 188 lb 6.4 oz (85.5 kg)  04/17/21 195 lb (88.5 kg)  04/15/21 194 lb 0.1 oz (88 kg)   BP Readings from Last 3 Encounters:  05/13/21 130/84  04/17/21 (!) 150/88  04/15/21 (!) 149/83   Reviewed past Medical, Social and Family history today.  Outpatient Medications Prior to Visit  Medication Sig Dispense Refill  . blood glucose meter kit and supplies Dispense based on patient and insurance preference. Use one time daily. ICD10: E11.65 1 each 0  . Insulin Pen Needle (PEN NEEDLES) 31G X 6 MM MISC 1 application by Does not apply route once a week. For levemir pen 12 each 1  . Syringe/Needle, Disp, (SYRINGE 3CC/20GX1") 20G X 1" 3 ML MISC 1 Units by Does not apply route 2 (two) times daily with a meal. 100 each 2  . insulin NPH-regular Human (HUMULIN 70/30) (70-30) 100 UNIT/ML injection Inject 10 Units into the skin 2 (two) times daily with a meal. Increase by 3units every 5days if fasting glucose is >150 10 mL 1  . fenofibrate micronized (LOFIBRA) 134 MG capsule Take 1 capsule (134 mg total) by mouth daily before breakfast. (Patient not taking: Reported on 05/13/2021) 90 capsule 3  . pioglitazone (ACTOS) 15 MG tablet Take 1 tablet (15 mg total) by mouth daily. (Patient not taking: Reported on 05/13/2021) 90 tablet 1  . ibuprofen (ADVIL) 600 MG tablet Take 1 tablet (600 mg total) by mouth every 8 (eight) hours as needed for moderate pain. (Patient not taking: Reported  on 05/13/2021) 30 tablet 0  . ondansetron (ZOFRAN ODT) 4 MG disintegrating tablet Take 1 tablet (4 mg total) by mouth every 8 (eight) hours as needed for nausea or vomiting. (Patient not taking: Reported on 05/13/2021) 20 tablet 0  . oxyCODONE-acetaminophen (PERCOCET/ROXICET) 5-325 MG tablet Take 1 tablet by mouth every 6 (six) hours as needed for severe pain. (Patient not taking: Reported on 05/13/2021) 10 tablet 0  . oxyCODONE-acetaminophen (PERCOCET/ROXICET) 5-325 MG tablet Take 1 tablet by mouth every 6 (six) hours as  needed for severe pain. (Patient not taking: Reported on 05/13/2021) 10 tablet 0  . promethazine (PHENERGAN) 25 MG suppository Place 1 suppository (25 mg total) rectally every 6 (six) hours as needed for nausea or vomiting. (Patient not taking: Reported on 05/13/2021) 12 each 0  . promethazine (PHENERGAN) 25 MG tablet Take 1 tablet (25 mg total) by mouth every 6 (six) hours as needed for nausea or vomiting. (Patient not taking: Reported on 05/13/2021) 12 tablet 0  . tamsulosin (FLOMAX) 0.4 MG CAPS capsule Take 1 capsule (0.4 mg total) by mouth daily. (Patient not taking: Reported on 05/13/2021) 10 capsule 0   No facility-administered medications prior to visit.    ROS See HPI  Objective:  BP 130/84 (BP Location: Left Arm, Patient Position: Sitting, Cuff Size: Large)   Pulse 84   Temp (!) 97.3 F (36.3 C) (Temporal)   Ht $R'5\' 7"'zZ$  (1.702 m)   Wt 188 lb 6.4 oz (85.5 kg)   SpO2 99%   BMI 29.51 kg/m   Physical Exam Vitals reviewed.  Constitutional:      Appearance: He is obese.  Cardiovascular:     Rate and Rhythm: Normal rate.     Pulses: Normal pulses.  Pulmonary:     Effort: Pulmonary effort is normal.  Neurological:     Mental Status: He is alert and oriented to person, place, and time.    Assessment & Plan:  This visit occurred during the SARS-CoV-2 public health emergency.  Safety protocols were in place, including screening questions prior to the visit, additional usage of staff PPE, and extensive cleaning of exam room while observing appropriate contact time as indicated for disinfecting solutions.   Jakhi was seen today for follow-up.  Diagnoses and all orders for this visit:  Type 2 diabetes mellitus with hyperglycemia, with long-term current use of insulin (HCC) -     insulin NPH-regular Human (HUMULIN 70/30) (70-30) 100 UNIT/ML injection; Inject 13 Units into the skin 2 (two) times daily with a meal. Increase by 3units every 5days if fasting glucose is >150 -     CT CARDIAC  SCORING (SELF PAY ONLY); Future -     atorvastatin (LIPITOR) 10 MG tablet; Take 1 tablet (10 mg total) by mouth at bedtime. -     glipiZIDE (GLUCOTROL XL) 5 MG 24 hr tablet; Take 1 tablet (5 mg total) by mouth daily with breakfast.  Coronary artery disease involving native heart without angina pectoris, unspecified vessel or lesion type -     Cancel: Ambulatory referral to Cardiology -     CT CARDIAC SCORING (SELF PAY ONLY); Future -     atorvastatin (LIPITOR) 10 MG tablet; Take 1 tablet (10 mg total) by mouth at bedtime.  Hypertriglyceridemia -     Cancel: Ambulatory referral to Cardiology -     CT CARDIAC SCORING (SELF PAY ONLY); Future -     atorvastatin (LIPITOR) 10 MG tablet; Take 1 tablet (10 mg total) by mouth at  bedtime.    Problem List Items Addressed This Visit      Cardiovascular and Mediastinum   Coronary artery disease involving native heart without angina pectoris    asymptomatic Per CT report 04/2021:Multifocal coronary atherosclerotic calcification, premature for Age. Uncontrolled DM LDL not at goal Improved BP No tobacco use, no ETOH use, no illicit drug use BP Readings from Last 3 Encounters:  05/13/21 130/84  04/17/21 (!) 150/88  04/15/21 (!) 149/83   Lipid Panel     Component Value Date/Time   CHOL 176 03/06/2021 1052   TRIG 261.0 (H) 03/06/2021 1052   HDL 32.40 (L) 03/06/2021 1052   CHOLHDL 5 03/06/2021 1052   VLDL 52.2 (H) 03/06/2021 1052   LDLDIRECT 100.0 03/06/2021 1052   Ordered Ct coronary calcium eval       Relevant Medications   atorvastatin (LIPITOR) 10 MG tablet   Other Relevant Orders   CT CARDIAC SCORING (SELF PAY ONLY)     Endocrine   DM (diabetes mellitus) ([place]) - Primary    Uncontrolled with AM and PM glucose >250 on average. Current use of humulin 70/30 10units BID He states he was unaware to titrate insulin dose. Continue weight loss and positive ketones in urine.  Advised him about possible organ damage with prolonged  uncontrolled DM. Advised about the need for basal insulin or SGLT-2 or DPP-4 or GLP-1 in combination with humulin to achieve better glucose control. States he is still unable to afford any of these medications. Advised to search for coupons/savings cards under the pharmaceutical website. He verbalized understanding. For now, he is to titrate humulin up 3units every 3days to get glucose <150. Resume use of actos as well Add glipizide 53m F/up in 154month    Relevant Medications   insulin NPH-regular Human (HUMULIN 70/30) (70-30) 100 UNIT/ML injection   atorvastatin (LIPITOR) 10 MG tablet   glipiZIDE (GLUCOTROL XL) 5 MG 24 hr tablet   Other Relevant Orders   CT CARDIAC SCORING (SELF PAY ONLY)     Other   Hypertriglyceridemia    Advised to start fenofibrate due to elevated triglycerides Add atorvastatin 1037mue to multifocal CAD per CT report Continue DASH diet      Relevant Medications   atorvastatin (LIPITOR) 10 MG tablet   Other Relevant Orders   CT CARDIAC SCORING (SELF PAY ONLY)      Follow-up: Return in about 4 weeks (around 06/10/2021) for DM and , hyperlipidemia (fasting).  ChaWilfred LacyP

## 2021-05-13 NOTE — Assessment & Plan Note (Addendum)
asymptomatic Per CT report 04/2021:Multifocal coronary atherosclerotic calcification, premature for Age. Uncontrolled DM LDL not at goal Improved BP No tobacco use, no ETOH use, no illicit drug use BP Readings from Last 3 Encounters:  05/13/21 130/84  04/17/21 (!) 150/88  04/15/21 (!) 149/83   Lipid Panel     Component Value Date/Time   CHOL 176 03/06/2021 1052   TRIG 261.0 (H) 03/06/2021 1052   HDL 32.40 (L) 03/06/2021 1052   CHOLHDL 5 03/06/2021 1052   VLDL 52.2 (H) 03/06/2021 1052   LDLDIRECT 100.0 03/06/2021 1052   Ordered Ct coronary calcium eval

## 2021-05-13 NOTE — Patient Instructions (Addendum)
Start fenobibrate for elevated triglycerides Increase humulin to 13units BID today, then by 3units every 3days if glucose not <150. start actos as prescribed.  Visit pharmaceutical company website for coupons/savings card for lantus os trulicity or ozempic or jardiance or invokana.

## 2021-05-14 ENCOUNTER — Telehealth: Payer: Self-pay | Admitting: Nurse Practitioner

## 2021-05-14 MED ORDER — GLIPIZIDE ER 5 MG PO TB24: 5.0000 mg | ORAL_TABLET | Freq: Every day | ORAL | 5 refills | Status: DC

## 2021-05-14 MED ORDER — ATORVASTATIN CALCIUM 10 MG PO TABS: 10.0000 mg | ORAL_TABLET | Freq: Every day | ORAL | 5 refills | Status: DC

## 2021-05-14 MED ORDER — ONETOUCH VERIO VI STRP: 1.0000 | ORAL_STRIP | 1 refills | Status: DC | PRN

## 2021-05-14 NOTE — Telephone Encounter (Signed)
Pt called to follow up with refill request for test strips, please advise

## 2021-05-14 NOTE — Telephone Encounter (Signed)
Consulted with Dr. Barron Alvine on others ways to manage DM and evaluate abnormal CT results. She recommended adding atorvastatin for cholesterol and glipizide for DM. This is in combination with fenofibrate, humulin and actos. She also recommending CT coronary calcium to eval severity of plaque in heart vessels. I sent new prescriptions and entered CT order.

## 2021-05-14 NOTE — Addendum Note (Signed)
Addended by: Alysia Penna L on: 05/14/2021 01:27 PM   Modules accepted: Orders

## 2021-05-14 NOTE — Telephone Encounter (Signed)
Spoke to patient regarding providers recommendations. He got upset due to he said he was told by [facility] yesterday on the phone not to take the Actos.  After I explained to him her recommedations and new medications that were added he calmed down some. He was saying "the he feels like he is being talked to like a kid, being told to get his things in order".  Advised him that I was just letting him know what Claris Gower advised.  He then said " I pay for my insurance and on top of that can't afford to pay $1000 out of pocket for medications.". advised that we understood that is why the 2 new medications to help with getting his sugars down. I did ask if he would like to speak to Shiner and he said" NO, he was at work and understood what he needed to do." Dm/cma

## 2021-05-14 NOTE — Telephone Encounter (Signed)
Patient notified VIA phone that test strips will be sent to the pharmacy. Dm/cma

## 2021-05-15 ENCOUNTER — Encounter: Payer: Self-pay | Admitting: Nurse Practitioner

## 2021-05-15 ENCOUNTER — Telehealth: Payer: Self-pay | Admitting: Nurse Practitioner

## 2021-05-15 DIAGNOSIS — E1165 Type 2 diabetes mellitus with hyperglycemia: Secondary | ICD-10-CM

## 2021-05-15 DIAGNOSIS — E781 Pure hyperglyceridemia: Secondary | ICD-10-CM

## 2021-05-15 DIAGNOSIS — Z794 Long term (current) use of insulin: Secondary | ICD-10-CM

## 2021-05-15 NOTE — Telephone Encounter (Signed)
Contacted Douglas Phillips to clarify medication instructions. He informs me that his glucose this evening is 126 before supper. He took humulin, glipizide 5mg , fenofibrate and actos 15mg  this AM with breakfast. Glusoce this morning was 216. He denies any hypoglycemic symptoms. Gave him the following instructions: AM medications with breakfast: Actos 7.5mg (1/2tab), humulin and fenofibrate PM medication with supper: glipizide 5mg , humulin and atorvastatin. He is to hold all diabetic medications is glucose <100. Advised him about hypoglycemia symptoms and treatment. He is to check glucose again today prior to bedtime. He will have fasting labs repeated on 06/04/2021 at 8am. He will send glucose reading via mychart on 06/04/2021 too. He verbalized understanding of instruction and had no additional questions.

## 2021-05-19 ENCOUNTER — Ambulatory Visit (HOSPITAL_COMMUNITY)
Admission: RE | Admit: 2021-05-19 | Discharge: 2021-05-19 | Disposition: A | Discharge status: Home / Self Care | Payer: Self-pay | Source: Ambulatory Visit | Attending: Nurse Practitioner | Admitting: Nurse Practitioner

## 2021-05-19 ENCOUNTER — Other Ambulatory Visit: Payer: Self-pay

## 2021-05-19 ENCOUNTER — Encounter (HOSPITAL_COMMUNITY): Payer: Self-pay

## 2021-05-19 DIAGNOSIS — Z794 Long term (current) use of insulin: Secondary | ICD-10-CM

## 2021-05-19 DIAGNOSIS — E1165 Type 2 diabetes mellitus with hyperglycemia: Secondary | ICD-10-CM

## 2021-05-19 DIAGNOSIS — I2510 Atherosclerotic heart disease of native coronary artery without angina pectoris: Secondary | ICD-10-CM

## 2021-05-19 DIAGNOSIS — E781 Pure hyperglyceridemia: Secondary | ICD-10-CM

## 2021-05-19 MED ORDER — ONETOUCH VERIO VI STRP: 1.0000 | ORAL_STRIP | Freq: Two times a day (BID) | 1 refills | Status: AC

## 2021-06-04 ENCOUNTER — Ambulatory Visit: Payer: 59 | Admitting: Nurse Practitioner

## 2021-06-04 ENCOUNTER — Other Ambulatory Visit: Payer: Self-pay

## 2021-06-04 ENCOUNTER — Other Ambulatory Visit (INDEPENDENT_AMBULATORY_CARE_PROVIDER_SITE_OTHER): Payer: 59

## 2021-06-04 DIAGNOSIS — Z794 Long term (current) use of insulin: Secondary | ICD-10-CM

## 2021-06-04 DIAGNOSIS — E782 Mixed hyperlipidemia: Secondary | ICD-10-CM

## 2021-06-04 DIAGNOSIS — E1165 Type 2 diabetes mellitus with hyperglycemia: Secondary | ICD-10-CM

## 2021-06-04 DIAGNOSIS — E781 Pure hyperglyceridemia: Secondary | ICD-10-CM | POA: Diagnosis not present

## 2021-06-04 LAB — HEPATIC FUNCTION PANEL
ALT: 17 U/L (ref 0–53)
AST: 12 U/L (ref 0–37)
Albumin: 4.5 g/dL (ref 3.5–5.2)
Alkaline Phosphatase: 36 U/L — ABNORMAL LOW (ref 39–117)
Bilirubin, Direct: 0.1 mg/dL (ref 0.0–0.3)
Total Bilirubin: 0.6 mg/dL (ref 0.2–1.2)
Total Protein: 6.7 g/dL (ref 6.0–8.3)

## 2021-06-04 LAB — BASIC METABOLIC PANEL
BUN: 13 mg/dL (ref 6–23)
CO2: 31 mEq/L (ref 19–32)
Calcium: 9.4 mg/dL (ref 8.4–10.5)
Chloride: 105 mEq/L (ref 96–112)
Creatinine, Ser: 0.8 mg/dL (ref 0.40–1.50)
GFR: 108.49 mL/min (ref >60.00)
Glucose, Bld: 130 mg/dL — ABNORMAL HIGH (ref 70–99)
Potassium: 4.1 mEq/L (ref 3.5–5.1)
Sodium: 142 mEq/L (ref 135–145)

## 2021-06-04 LAB — CK: Total CK: 89 U/L (ref 7–232)

## 2021-06-04 LAB — LIPID PANEL
Cholesterol: 112 mg/dL (ref 0–200)
HDL: 44.1 mg/dL (ref >39.00)
LDL Cholesterol: 56 mg/dL (ref 0–99)
NonHDL: 67.5
Total CHOL/HDL Ratio: 3
Triglycerides: 60 mg/dL (ref 0.0–149.0)
VLDL: 12 mg/dL (ref 0.0–40.0)

## 2021-06-04 LAB — HEMOGLOBIN A1C: Hgb A1c MFr Bld: 9 % — ABNORMAL HIGH (ref 4.6–6.5)

## 2021-06-04 NOTE — Assessment & Plan Note (Signed)
Lipid Panel     Component Value Date/Time   CHOL 112 06/04/2021 0807   TRIG 60.0 06/04/2021 0807   HDL 44.10 06/04/2021 0807   CHOLHDL 3 06/04/2021 0807   VLDL 12.0 06/04/2021 0807   LDLCALC 56 06/04/2021 0807   LDLDIRECT 100.0 03/06/2021 1052   Stop fenofibrate and continue atorvastatin

## 2021-06-04 NOTE — Addendum Note (Signed)
Addended by: Michaela Corner on: 06/04/2021 08:50 PM   Modules accepted: Orders

## 2021-06-05 ENCOUNTER — Encounter: Payer: Self-pay | Admitting: Nurse Practitioner

## 2021-06-05 DIAGNOSIS — Z794 Long term (current) use of insulin: Secondary | ICD-10-CM

## 2021-06-05 DIAGNOSIS — E1165 Type 2 diabetes mellitus with hyperglycemia: Secondary | ICD-10-CM

## 2021-06-08 NOTE — Telephone Encounter (Signed)
Updated his med list with units of insulin taken daily to 18.  Please review and advise.  Thanks. Dm/cma

## 2021-09-01 ENCOUNTER — Other Ambulatory Visit: Payer: Self-pay | Admitting: Nurse Practitioner

## 2021-09-01 ENCOUNTER — Ambulatory Visit (HOSPITAL_BASED_OUTPATIENT_CLINIC_OR_DEPARTMENT_OTHER)
Admission: RE | Admit: 2021-09-01 | Discharge: 2021-09-01 | Disposition: A | Discharge status: Home / Self Care | Payer: 59 | Source: Ambulatory Visit | Attending: Nurse Practitioner | Admitting: Nurse Practitioner

## 2021-09-01 ENCOUNTER — Other Ambulatory Visit: Payer: Self-pay

## 2021-09-01 DIAGNOSIS — Z794 Long term (current) use of insulin: Secondary | ICD-10-CM | POA: Insufficient documentation

## 2021-09-01 DIAGNOSIS — E781 Pure hyperglyceridemia: Secondary | ICD-10-CM | POA: Insufficient documentation

## 2021-09-01 DIAGNOSIS — I712 Thoracic aortic aneurysm, without rupture, unspecified: Secondary | ICD-10-CM

## 2021-09-01 DIAGNOSIS — I2510 Atherosclerotic heart disease of native coronary artery without angina pectoris: Secondary | ICD-10-CM

## 2021-09-01 DIAGNOSIS — E1165 Type 2 diabetes mellitus with hyperglycemia: Secondary | ICD-10-CM | POA: Insufficient documentation

## 2021-09-04 ENCOUNTER — Other Ambulatory Visit: Payer: Self-pay

## 2021-09-04 MED ORDER — INSULIN SYRINGE 31G X 5/16" 1 ML MISC
1.0000 | Freq: Two times a day (BID) | 3 refills | Status: DC
Start: 2021-09-04 — End: 2021-10-12

## 2021-09-04 NOTE — Telephone Encounter (Signed)
spoket to patient and her request to get an insulin syringe 31 gage sent tot he pharmacy to replace the one he has.  RX sent to the pharmacy. Dm/cma

## 2021-09-07 ENCOUNTER — Telehealth: Payer: Self-pay

## 2021-09-07 ENCOUNTER — Telehealth: Payer: Self-pay | Admitting: Nurse Practitioner

## 2021-09-07 DIAGNOSIS — E782 Mixed hyperlipidemia: Secondary | ICD-10-CM

## 2021-09-07 DIAGNOSIS — I712 Thoracic aortic aneurysm, without rupture, unspecified: Secondary | ICD-10-CM

## 2021-09-07 MED ORDER — FENOFIBRATE 145 MG PO TABS: 145.0000 mg | ORAL_TABLET | Freq: Every day | ORAL | 0 refills | Status: DC

## 2021-09-07 NOTE — Telephone Encounter (Signed)
Pt returned call

## 2021-09-07 NOTE — Telephone Encounter (Signed)
Received a Prior auth for Fenofibrate 134 mg capsules.  Insurance will not cover this strength. Is there anything else he can be given? Please review and advise.  Thanks. Dm/cma

## 2021-09-07 NOTE — Telephone Encounter (Signed)
CT chest angiogram is recommended by cardiothoracic specialist to further evaluate degree of aneurysm and coronary artery disease. I entered the order.

## 2021-09-07 NOTE — Telephone Encounter (Signed)
-----   Message from Douglas Phillips sent at 09/02/2021  3:07 PM EDT ----- Please order and scheduled CTA Chest aorta so that surgeon can look at images when patient comes for appointment. As soon as this is scheduled our office will set up appointment. Thanks  [employer]

## 2021-09-07 NOTE — Telephone Encounter (Signed)
Lft detailed VM that Fenofibrate was changed to tablets and sent to pharmacy.  Advised to call if anymore problems. Dm/cma

## 2021-09-08 ENCOUNTER — Telehealth: Payer: Self-pay | Admitting: Nurse Practitioner

## 2021-09-10 ENCOUNTER — Telehealth: Payer: Self-pay | Admitting: Nurse Practitioner

## 2021-09-10 DIAGNOSIS — Z794 Long term (current) use of insulin: Secondary | ICD-10-CM

## 2021-09-10 DIAGNOSIS — E782 Mixed hyperlipidemia: Secondary | ICD-10-CM

## 2021-09-10 DIAGNOSIS — E781 Pure hyperglyceridemia: Secondary | ICD-10-CM

## 2021-09-10 DIAGNOSIS — I2510 Atherosclerotic heart disease of native coronary artery without angina pectoris: Secondary | ICD-10-CM

## 2021-09-10 NOTE — Telephone Encounter (Signed)
Duplicate documentation, closing encounter.

## 2021-09-10 NOTE — Telephone Encounter (Signed)
Duplicate message closing encounter

## 2021-09-11 MED ORDER — ATORVASTATIN CALCIUM 40 MG PO TABS: 40.0000 mg | ORAL_TABLET | Freq: Every day | ORAL | 0 refills | Status: DC

## 2021-09-11 MED ORDER — FENOFIBRATE 50 MG PO CAPS: 1.0000 | ORAL_CAPSULE | Freq: Every day | ORAL | 0 refills | Status: DC

## 2021-09-11 NOTE — Telephone Encounter (Signed)
Explained reason for referral to cardiology and cardiothoracic surgeon. Extensive CAD and aortic aneurysm respectively per CT cardiac scan. Also explain need for CTA chest prior to appt with CTS Informed about upcoming appts for CTA chest, cardiology, myself and CTS. Fenofibrate and atorvastatin dose adjusted based on CT cardiac finding, insurance coverage and lipid panel results. She verbalized understanding.

## 2021-09-11 NOTE — Telephone Encounter (Signed)
Sarah(pt's spouse) needs a call back pertaining to his fenofibrate (TRICOR) 145 MG tablet [[id number]] script, it has to be 50 or 160mg , or insurance will not pay for it. He has not had his script for over a week.   is very concerned about her husband's referral to a cardiologist. She has many questions. Please advise Douglas Phillips at [phone].

## 2021-09-14 NOTE — Telephone Encounter (Signed)
Addressed and handled by provider.

## 2021-09-17 ENCOUNTER — Telehealth: Payer: Self-pay | Admitting: Nurse Practitioner

## 2021-09-17 NOTE — Telephone Encounter (Signed)
Pt called stating his insurance has changed from [employer] to [employer]... [place] sent a form of sorts to be sure his original insurance will cover a procedure. He will need a new one sent.Marland KitchenMarland Kitchen

## 2021-09-18 NOTE — Telephone Encounter (Signed)
Patient states he has new insurance as of 09/12/21 and would need this filed with his new insurance for the CT. Pt informed someone will call him to assist with this.

## 2021-09-21 ENCOUNTER — Telehealth: Payer: Self-pay | Admitting: Nurse Practitioner

## 2021-09-21 NOTE — Telephone Encounter (Signed)
Pt is wanting his CT ANGIO CHEST AORTA W/CM & OR WO/CM (Order [id number] Scheduled on 09/24/2021  8:30 AM at  [facility] AT [address]  Changed to [facility] [address] [place]. The procedure is half the cost at [place]. Please advise pt at [phone].

## 2021-09-22 NOTE — Telephone Encounter (Signed)
S/w patient to confirm he wanted to go to [facility] since I saw an appt scheduled for Thursday. Patient states he was sure he wanted to go to [place] if they could get him in. I called was transferred and finally told by a Jill Alexanders I would get a call back from [place]. Still no call back. I will try again on Wednesday to get to CT dept.

## 2021-09-24 ENCOUNTER — Other Ambulatory Visit: Payer: Self-pay

## 2021-09-24 ENCOUNTER — Ambulatory Visit
Admission: RE | Admit: 2021-09-24 | Discharge: 2021-09-24 | Disposition: A | Discharge status: Home / Self Care | Payer: BC Managed Care – PPO | Source: Ambulatory Visit | Attending: Nurse Practitioner | Admitting: Nurse Practitioner

## 2021-09-24 DIAGNOSIS — Z8679 Personal history of other diseases of the circulatory system: Secondary | ICD-10-CM | POA: Diagnosis not present

## 2021-09-24 DIAGNOSIS — I712 Thoracic aortic aneurysm, without rupture, unspecified: Secondary | ICD-10-CM

## 2021-09-24 MED ORDER — IOPAMIDOL (ISOVUE-370) INJECTION 76%
75.0000 mL | Freq: Once | INTRAVENOUS | Status: AC | PRN
  Administered 2021-09-24: 75 mL via INTRAVENOUS

## 2021-09-25 ENCOUNTER — Telehealth: Payer: Self-pay

## 2021-09-25 MED ORDER — FENOFIBRATE 54 MG PO TABS: 54.0000 mg | ORAL_TABLET | Freq: Every day | ORAL | 1 refills | Status: DC

## 2021-09-25 NOTE — Telephone Encounter (Signed)
Patient notified VIA phone. Dm/cma  

## 2021-09-25 NOTE — Telephone Encounter (Signed)
Pharmacy needs an RX for Fenofibrate to replace the Fenofibrate 50 mg caps due to PA.  Pharmacy need a script for either 48 mg or 54 mg.  Please review and advise.  Thanks. Dm/cma

## 2021-09-28 NOTE — Progress Notes (Signed)
Cardiology Office Note:    Date:  09/28/2021   ID:  [name], DOB 1976/11/18, MRN [id number]  PCP:  Anne Ng, NP   [facility] Providers Cardiologist:  None     Referring MD: Anne Ng, NP   No chief complaint on file. CVD Risk  History of Present Illness:    Douglas Phillips is a 45 y.o. male with a hx of DMII 9.0 (13.5), referral to cardiology s/p CT scan with high CAC 1470, predominantly in the Lcx (768) and LAD (278), Estimated greatest diameter of the ascending aorta 4.3 cm, of ascending aorta 09/24/2021  Mr. Kreitzer was referred by his primary provider 2/2 high CAC and ascending aortic aneurysm. The CT scan was sent 2/2 concern on an abdominal CT scan he received for renal stone 04/2021. It showed increased CAC premature for age.  He has no history of CVD. He started atorvastatin which was reduced from 40 mg to 10 mg. He denies chest pain or dyspnea on exertion. He denies PND, no lower extremity edema.  Social History: no smoking. No drug use. He is fairly sedentary, he is increasing his physical activity.  Family History: Paternal Grandfather 2-3 myocardial infarctions, early to mid 49s. Mother is healthy. Sister is healthy. Niece born with hole in the heart. No history aortic aneurysm or dissection.  Surgical History: Discectomies  TG - 60 ( from up to 600s-fenofibrate, he couldn't take) LDL 56 Normal LFTs  Past Medical History:  Diagnosis Date   Diabetes mellitus without complication (HCC)    High cholesterol    Hypertension    Kidney stones    Migraine    MIGRAINE HEADACHE 04/05/2007   Qualifier: Diagnosis of  By: Drue Novel MD, Nolon Rod.    Non-restorative sleep 03/12/2019    Past Surgical History:  Procedure Laterality Date   BACK SURGERY     ruptured L4 and L5   MOUTH SURGERY      Current Medications: No outpatient medications have been marked as taking for the 09/29/21 encounter (Appointment) with Maisie Fus, MD.      Allergies:   Patient has no known allergies.   Social History   Socioeconomic History   Marital status: Married    Spouse name: Not on file   Number of children: Not on file   Years of education: Not on file   Highest education level: Not on file  Occupational History   Not on file  Tobacco Use   Smoking status: Never   Smokeless tobacco: Never  Vaping Use   Vaping Use: Never used  Substance and Sexual Activity   Alcohol use: No   Drug use: No   Sexual activity: Not on file  Other Topics Concern   Not on file  Social History Narrative   Not on file   Social Determinants of Health   Financial Resource Strain: Not on file  Food Insecurity: Not on file  Transportation Needs: Not on file  Physical Activity: Not on file  Stress: Not on file  Social Connections: Not on file     Family History: The patient's family history includes Heart attack in his paternal grandfather; Hypertension in his father and paternal grandfather; Liver cancer in his maternal grandmother; Osteoarthritis in his mother. There is no history of Colon cancer or Prostate cancer.  ROS:   Please see the history of present illness.     All other systems reviewed and are negative.  EKGs/Labs/Other Studies Reviewed:  The following studies were reviewed today:   EKG:  EKG is  ordered today.  The ekg ordered today demonstrates   Sinus bradycardia 56 bpm LAD  Recent Labs: 03/06/2021: TSH 2.26 04/17/2021: Hemoglobin 13.2; Platelets 178 06/04/2021: ALT 17; BUN 13; Creatinine, Ser 0.80; Potassium 4.1; Sodium 142  Recent Lipid Panel    Component Value Date/Time   CHOL 112 06/04/2021 0807   TRIG 60.0 06/04/2021 0807   HDL 44.10 06/04/2021 0807   CHOLHDL 3 06/04/2021 0807   VLDL 12.0 06/04/2021 0807   LDLCALC 56 06/04/2021 0807   LDLDIRECT 100.0 03/06/2021 1052     Risk Assessment/Calculations:    The ASCVD Risk score (Arnett DK, et al., 2019) failed to calculate for the following reasons:    The valid total cholesterol range is 130 to 320 mg/dL        Physical Exam:    VS:  There were no vitals taken for this visit.    Wt Readings from Last 3 Encounters:  05/13/21 188 lb 6.4 oz (85.5 kg)  04/17/21 195 lb (88.5 kg)  04/15/21 194 lb 0.1 oz (88 kg)     GEN:  Well nourished, well developed in no acute distress HEENT: Normal NECK: No JVD; No carotid bruits LYMPHATICS: No lymphadenopathy CARDIAC: RRR, no murmurs, rubs, gallops RESPIRATORY:  Clear to auscultation without rales, wheezing or rhonchi  ABDOMEN: Soft, non-tender, non-distended MUSCULOSKELETAL:  No edema; No deformity  SKIN: Warm and dry NEUROLOGIC:  Alert and oriented x 3 PSYCHIATRIC:  Normal affect   ASSESSMENT:    #CVD Risk: High Cac. His CAC is substantial at 1470. His other risk factors include diabetes and obesity. We discussed the importance of diabetes A1c lowering. I counseled him on diet. We discussed stopping red meat and consider more plant based. He does not smoke.Will increase his atorvastatin back to 40 mg with LDL at goal with this dose.  #Aortic Aneurysm: will get TTE to assess for BAV. No signs of marfans or family history of dissection. Otherwise will follow up in 12 months to repeat the CTA to assess for change in size. CT surgery referral was sent however greatest size is < 5.5 cm  PLAN:    In order of problems listed above:  TTE- assess for BAV Atorvastatin 40 mg Follow up 12 months for repeat CTA to assess ascending aorta   Medication Adjustments/Labs and Tests Ordered: Current medicines are reviewed at length with the patient today.  Concerns regarding medicines are outlined above.    Signed, Maisie Fus, MD  09/28/2021 3:44 PM     [facility]

## 2021-09-29 ENCOUNTER — Other Ambulatory Visit: Payer: Self-pay

## 2021-09-29 ENCOUNTER — Ambulatory Visit (INDEPENDENT_AMBULATORY_CARE_PROVIDER_SITE_OTHER): Payer: BC Managed Care – PPO | Admitting: Internal Medicine

## 2021-09-29 ENCOUNTER — Encounter: Payer: Self-pay | Admitting: Internal Medicine

## 2021-09-29 ENCOUNTER — Telehealth: Payer: Self-pay | Admitting: Nurse Practitioner

## 2021-09-29 VITALS — BP 156/83 | HR 56 | Ht 67.0 in | Wt 210.0 lb

## 2021-09-29 DIAGNOSIS — I719 Aortic aneurysm of unspecified site, without rupture: Secondary | ICD-10-CM

## 2021-09-29 MED ORDER — ATORVASTATIN CALCIUM 40 MG PO TABS: 40.0000 mg | ORAL_TABLET | Freq: Every day | ORAL | 3 refills | Status: AC

## 2021-09-29 NOTE — Patient Instructions (Addendum)
Medication Instructions:  Increase Atorvastatin to 40 mg daily  *If you need a refill on your cardiac medications before your next appointment, please call your pharmacy*   Lab Work: None ordered today   Testing/Procedures: Your physician has requested that you have an echocardiogram. Echocardiography is a painless test that uses sound waves to create images of your heart. It provides your doctor with information about the size and shape of your heart and how well your heart's chambers and valves are working. This procedure takes approximately one hour. There are no restrictions for this procedure. [address]    Follow-Up: At [facility], you and your health needs are our priority.  As part of our continuing mission to provide you with exceptional heart care, we have created designated Provider Care Teams.  These Care Teams include your primary Cardiologist (physician) and Advanced Practice Providers (APPs -  Physician Assistants and Nurse Practitioners) who all work together to provide you with the care you need, when you need it.  We recommend signing up for the patient portal called "MyChart".  Sign up information is provided on this After Visit Summary.  MyChart is used to connect with patients for Virtual Visits (Telemedicine).  Patients are able to view lab/test results, encounter notes, upcoming appointments, etc.  Non-urgent messages can be sent to your provider as well.   To learn more about what you can do with MyChart, go to [email or web address].    Your next appointment:   1 year(s)  The format for your next appointment:   In Person  Provider:   Dr. Wyline Mood

## 2021-10-02 NOTE — Telephone Encounter (Signed)
Called patient in regards to some imaging and pt states he still has not received his Humulin medication. I informed patient that due to the switch in his insurance it now required a PA which was initially called in on yesterday and completed today and we were waiting on a responds from them. Pt states that is not acceptable because he called 4 days ago and our office was informed by the pharmacy sooner than that. He states he appreciates the provider getting his blood sugars under control with her care but does not understand why it is so hard for [place] to swap some emails with insurance to get his medication approved in a timely manner. Pt said this basically tells him he needs to find a new provider. I informed patient it requires more than swapping emails and that we followed the proper steps given the information we had. Pt states he is tired of excuses and would like a call from the clinical manager. Douglas Phillips has been informed.

## 2021-10-07 ENCOUNTER — Ambulatory Visit (INDEPENDENT_AMBULATORY_CARE_PROVIDER_SITE_OTHER): Payer: BC Managed Care – PPO | Admitting: Nurse Practitioner

## 2021-10-07 ENCOUNTER — Encounter: Payer: Self-pay | Admitting: Nurse Practitioner

## 2021-10-07 ENCOUNTER — Other Ambulatory Visit: Payer: Self-pay

## 2021-10-07 ENCOUNTER — Telehealth: Payer: Self-pay | Admitting: Nurse Practitioner

## 2021-10-07 VITALS — BP 138/72 | HR 66 | Temp 97.9°F | Ht 67.0 in | Wt 211.4 lb

## 2021-10-07 DIAGNOSIS — E1165 Type 2 diabetes mellitus with hyperglycemia: Secondary | ICD-10-CM

## 2021-10-07 DIAGNOSIS — Z794 Long term (current) use of insulin: Secondary | ICD-10-CM

## 2021-10-07 NOTE — Progress Notes (Signed)
Subjective:  Patient ID: Douglas Phillips, male    DOB: May 08, 1976  Age: 45 y.o. MRN: [id number]  CC: Follow-up (F/u on DM and Cholesterol. Pt checks blood sugars at home and state the are staying in the 100s, low around 106 and high around 170/Pt is fasting. /States diabetic eye exam was done in 01/2021 by [facility].)  HPI Accompanied by his wife Mr. Capers is here for DM f/up. Glucose reading for last last 60days (AM glucose only): 90-130. No PM glucose. Reports he is complaints with humulin 13units BID, glipizide 80m, and actos 7.554m Informed Mr. BuStaleyhat AM glucose are good, but PM glucose are also necessary  to ensure insulin is not administered when glucose is low. This is to avoid hypoglyemia. Mr. BuEnderletated, he gets home late and has already had supper before he is able to check glucose. I stated we will repeat labs today. If hgbA1c has improved, we need to change insulin to basal insulin (long acting) since he is unable to check glucose BID. There is low risk of hypoglycemia with basal insulin. He verbalized understanding. He also mentions his dissatisfaction with insulin prior authorization process, [facility], and request to change fenofibrate dose due to cost. He states nobody seems to care that he has spent almost $2000 in last 55m68monthStates he lost $200,000 last year so every dollar counts. He states he pays us,[place] we work for him. We are suppose to be the healthcare providers but we are not doing anything for him. I informed Mr. BulBoeningat his accusations are inaccurate. I stated; I responded to every request to change fenofibrate dose per his insurance coverage, Insulin prior authorization was initiated 10/18 but not complete till 10/21 due to change in insurance coverage per CMA, and I was not aware of his request to change CT location. After review of his chart, I informed him that the referral coordinator reached out  to [facility] but did not get a response.  Mr. BulFlaneryated I was talking over him and focusing on details on my computer. He stated he was not referring to me, but to [place] stated I was not listening to him and no one seems to care that he his spending all this money. I informed Mr. BulCasaleat is beyond my control and I will like to focus today on managing Diabetes. I asked if that was ok with him? He stated he was ready to go. He stated his glucose is now controlled. I informed him about the need to maintain a healthcare provider to continue management of his health. He verbalized understanding and left exam room.   Reviewed past Medical, Social and Family history today.  Outpatient Medications Prior to Visit  Medication Sig Dispense Refill   atorvastatin (LIPITOR) 40 MG tablet Take 1 tablet (40 mg total) by mouth daily. 90 tablet 3   blood glucose meter kit and supplies Dispense based on patient and insurance preference. Use one time daily. ICD10: E11.65 1 each 0   fenofibrate 54 MG tablet Take 1 tablet (54 mg total) by mouth daily. 90 tablet 1   glipiZIDE (GLUCOTROL XL) 5 MG 24 hr tablet Take 1 tablet (5 mg total) by mouth daily with breakfast. 30 tablet 5   glucose blood (ONETOUCH VERIO) test strip 1 each by Other route in the morning and at bedtime. Use as instructed 100 each 1   insulin NPH-regular  Human (HUMULIN 70/30) (70-30) 100 UNIT/ML injection Inject 13 Units into the skin 2 (two) times daily with a meal. Increase by 3units every 5days if fasting glucose is >150 10 mL 5   Insulin Pen Needle (PEN NEEDLES) 31G X 6 MM MISC 1 application by Does not apply route once a week. For levemir pen 12 each 1   Insulin Syringe-Needle U-100 (INSULIN SYRINGE 1CC/31GX5/16") 31G X 5/16" 1 ML MISC 1 each by Does not apply route in the morning and at bedtime. 100 each 3   pioglitazone (ACTOS) 15 MG tablet Take 0.5 tablets (7.5 mg total) by mouth daily. 90 tablet 1    Syringe/Needle, Disp, (SYRINGE 3CC/20GX1") 20G X 1" 3 ML MISC 1 Units by Does not apply route 2 (two) times daily with a meal. 100 each 2   No facility-administered medications prior to visit.   ROS See HPI  Objective:  BP 138/72 (BP Location: Left Arm, Patient Position: Sitting, Cuff Size: Large)   Pulse 66   Temp 97.9 F (36.6 C) (Temporal)   Ht 5' 7"  (1.702 m)   Wt 211 lb 6.4 oz (95.9 kg)   SpO2 98%   BMI 33.11 kg/m   Assessment & Plan:  This visit occurred during the SARS-CoV-2 public health emergency.  Safety protocols were in place, including screening questions prior to the visit, additional usage of staff PPE, and extensive cleaning of exam room while observing appropriate contact time as indicated for disinfecting solutions.   Ervie was seen today for follow-up.  Diagnoses and all orders for this visit:  Type 2 diabetes mellitus with hyperglycemia, with long-term current use of insulin ([place])  Problem List Items Addressed This Visit       Endocrine   DM (diabetes mellitus) ([place]) - Primary    Follow-up: No follow-ups on file.  Wilfred Lacy, NP

## 2021-10-07 NOTE — Telephone Encounter (Signed)
PA for humulin was denied. Your insurance will only accept Novolin. In order to send this prescription, I will need updated lab results. Schedule lab appt for HgbA1c and BMP.

## 2021-10-07 NOTE — Telephone Encounter (Signed)
Patient notified and verbalized understanding. Lab appointment scheduled. 

## 2021-10-08 ENCOUNTER — Ambulatory Visit (HOSPITAL_COMMUNITY): Payer: BC Managed Care – PPO | Attending: Internal Medicine

## 2021-10-08 ENCOUNTER — Encounter: Payer: Self-pay | Admitting: Nurse Practitioner

## 2021-10-08 DIAGNOSIS — I719 Aortic aneurysm of unspecified site, without rupture: Secondary | ICD-10-CM | POA: Insufficient documentation

## 2021-10-08 LAB — ECHOCARDIOGRAM COMPLETE
Area-P 1/2: 3.23 cm2
S' Lateral: 3.3 cm

## 2021-10-12 ENCOUNTER — Other Ambulatory Visit: Payer: Self-pay

## 2021-10-12 ENCOUNTER — Other Ambulatory Visit (INDEPENDENT_AMBULATORY_CARE_PROVIDER_SITE_OTHER): Payer: BC Managed Care – PPO

## 2021-10-12 DIAGNOSIS — Z794 Long term (current) use of insulin: Secondary | ICD-10-CM | POA: Diagnosis not present

## 2021-10-12 DIAGNOSIS — E1165 Type 2 diabetes mellitus with hyperglycemia: Secondary | ICD-10-CM

## 2021-10-12 LAB — BASIC METABOLIC PANEL
BUN: 15 mg/dL (ref 6–23)
CO2: 29 mEq/L (ref 19–32)
Calcium: 9 mg/dL (ref 8.4–10.5)
Chloride: 106 mEq/L (ref 96–112)
Creatinine, Ser: 0.8 mg/dL (ref 0.40–1.50)
GFR: 108.22 mL/min (ref >60.00)
Glucose, Bld: 132 mg/dL — ABNORMAL HIGH (ref 70–99)
Potassium: 4 mEq/L (ref 3.5–5.1)
Sodium: 143 mEq/L (ref 135–145)

## 2021-10-12 LAB — HEMOGLOBIN A1C: Hgb A1c MFr Bld: 5.9 % (ref 4.6–6.5)

## 2021-10-12 MED ORDER — PIOGLITAZONE HCL 15 MG PO TABS: 7.5000 mg | ORAL_TABLET | Freq: Every day | ORAL | 0 refills | Status: DC

## 2021-10-12 MED ORDER — GLIPIZIDE ER 5 MG PO TB24: 5.0000 mg | ORAL_TABLET | Freq: Every day | ORAL | 0 refills | Status: DC

## 2021-10-12 NOTE — Addendum Note (Signed)
Addended by: Alysia Penna L on: 10/12/2021 01:42 PM   Modules accepted: Orders

## 2021-10-14 ENCOUNTER — Encounter: Payer: 59 | Admitting: Surgery

## 2021-10-28 ENCOUNTER — Other Ambulatory Visit: Payer: Self-pay | Admitting: Nurse Practitioner

## 2021-10-28 DIAGNOSIS — E291 Testicular hypofunction: Secondary | ICD-10-CM | POA: Diagnosis not present

## 2021-10-28 DIAGNOSIS — Z794 Long term (current) use of insulin: Secondary | ICD-10-CM

## 2021-10-28 DIAGNOSIS — E1165 Type 2 diabetes mellitus with hyperglycemia: Secondary | ICD-10-CM | POA: Diagnosis not present

## 2021-10-28 DIAGNOSIS — Z0000 Encounter for general adult medical examination without abnormal findings: Secondary | ICD-10-CM | POA: Diagnosis not present

## 2021-10-28 DIAGNOSIS — N529 Male erectile dysfunction, unspecified: Secondary | ICD-10-CM | POA: Diagnosis not present

## 2021-11-04 ENCOUNTER — Other Ambulatory Visit: Payer: Self-pay

## 2021-11-04 ENCOUNTER — Encounter: Payer: Self-pay | Admitting: Surgery

## 2021-11-04 ENCOUNTER — Institutional Professional Consult (permissible substitution) (INDEPENDENT_AMBULATORY_CARE_PROVIDER_SITE_OTHER): Payer: BC Managed Care – PPO | Admitting: Surgery

## 2021-11-04 VITALS — BP 145/85 | HR 72 | Resp 20 | Ht 67.0 in | Wt 205.0 lb

## 2021-11-04 DIAGNOSIS — I7121 Aneurysm of the ascending aorta, without rupture: Secondary | ICD-10-CM | POA: Diagnosis not present

## 2021-11-07 NOTE — Progress Notes (Signed)
Patient ID: Douglas Phillips, male   DOB: [birth year], 45 y.o.   MRN: [id number]      [address]       [place] [address]             [phone]          CARDIOTHORACIC SURGERY CONSULTATION REPORT  PCP is Nche, Charlene Brooke, NP Referring Provider is Phineas Inches, MD Primary Cardiologist is Phineas Inches, MD  Reason for consultation:  Bicuspid aortic valve with aortic root and ascending aortic aneurysm, mild to moderate AI  HPI:  The patient is a 45 year old gentleman with history of type 2 diabetes, hyperlipidemia, hypertension, and kidney stones who underwent a CT cardiac calcium scoring exam on 09/01/2021 which showed a calcium score of 1470 which was 99th percentile for his age, race, and sex matched controls.  It also showed an ascending aortic aneurysm measured at 48 mm.  He was therefore referred to Dr. Harl Bowie for cardiology evaluation. He was started on atorvastatin.  He subsequently underwent a CTA of the chest on 09/24/2021 showing an ascending aortic aneurysm measured at 4.3 cm.  The aortic root aneurysm was not mentioned by radiology but on my measurement the aortic root was 5.2 cm.  An echocardiogram on 10/08/2021 showed a bicuspid aortic valve with mild to moderate eccentric aortic insufficiency.  The aortic root diameter was measured at 5.3 cm with the sinotubular junction measured at 4.3 cm.  The mid ascending aorta was measured at 4.5 cm.  There is no family history of aortic aneurysm, bicuspid aortic valve disease, or aortic dissection.  He has a niece who was born with a hole in her heart.  His paternal grandfather had a few myocardial infarctions in his early to mid 79s.  Past Medical History:  Diagnosis Date   Diabetes mellitus without complication (HCC)    High cholesterol    Hypertension    Kidney stones    Migraine    MIGRAINE HEADACHE 04/05/2007   Qualifier: Diagnosis of  By: Larose Kells MD, [place] [facility] 03/12/2019    Past Surgical  History:  Procedure Laterality Date   BACK SURGERY     ruptured L4 and L5   MOUTH SURGERY      Family History  Problem Relation Age of Onset   Osteoarthritis Mother    Hypertension Father    Liver cancer Maternal Grandmother    Hypertension Paternal Grandfather    Heart attack Paternal Grandfather    Colon cancer Neg Hx    Prostate cancer Neg Hx     Social History   Socioeconomic History   Marital status: Married    Spouse name: Not on file   Number of children: Not on file   Years of education: Not on file   Highest education level: Not on file  Occupational History   Not on file  Tobacco Use   Smoking status: Never   Smokeless tobacco: Never  Vaping Use   Vaping Use: Never used  Substance and Sexual Activity   Alcohol use: No   Drug use: No   Sexual activity: Not on file  Other Topics Concern   Not on file  Social History Narrative   Not on file   Social Determinants of Health   Financial Resource Strain: Not on file  Food Insecurity: Not on file  Transportation Needs: Not on file  Physical Activity: Not on file  Stress: Not on file  Social Connections:  Not on file  Intimate Partner Violence: Not on file    Prior to Admission medications   Medication Sig Start Date End Date Taking? Authorizing Provider  atorvastatin (LIPITOR) 40 MG tablet Take 1 tablet (40 mg total) by mouth daily. 09/29/21  Yes BranchRoyetta Crochet, MD  blood glucose meter kit and supplies Dispense based on patient and insurance preference. Use one time daily. ICD10: E11.65 03/06/21  Yes Nche, Charlene Brooke, NP  fenofibrate 54 MG tablet Take 1 tablet (54 mg total) by mouth daily. 09/25/21  Yes Nche, Charlene Brooke, NP  glipiZIDE (GLUCOTROL XL) 5 MG 24 hr tablet Take 1 tablet (5 mg total) by mouth daily with breakfast. 10/12/21  Yes Nche, Charlene Brooke, NP  glucose blood (ONETOUCH VERIO) test strip 1 each by Other route in the morning and at bedtime. Use as instructed 05/19/21  Yes Nche, Charlene Brooke, NP  pioglitazone (ACTOS) 15 MG tablet Take 0.5 tablets (7.5 mg total) by mouth daily. With food 10/12/21  Yes Nche, Charlene Brooke, NP    Current Outpatient Medications  Medication Sig Dispense Refill   atorvastatin (LIPITOR) 40 MG tablet Take 1 tablet (40 mg total) by mouth daily. 90 tablet 3   blood glucose meter kit and supplies Dispense based on patient and insurance preference. Use one time daily. ICD10: E11.65 1 each 0   fenofibrate 54 MG tablet Take 1 tablet (54 mg total) by mouth daily. 90 tablet 1   glipiZIDE (GLUCOTROL XL) 5 MG 24 hr tablet Take 1 tablet (5 mg total) by mouth daily with breakfast. 90 tablet 0   glucose blood (ONETOUCH VERIO) test strip 1 each by Other route in the morning and at bedtime. Use as instructed 100 each 1   pioglitazone (ACTOS) 15 MG tablet Take 0.5 tablets (7.5 mg total) by mouth daily. With food 45 tablet 0   No current facility-administered medications for this visit.    No Known Allergies    Review of Systems:   General:  normal appetite, normal energy, no weight gain, no weight loss, no fever  Cardiac:  no chest pain with exertion, no chest pain at rest, no SOB with  exertion, no resting SOB, no PND, no orthopnea, no palpitations, no arrhythmia, no atrial fibrillation, no LE edema, no dizzy spells, no syncope  Respiratory:  no shortness of breath, no home oxygen, no productive cough, no dry cough, no bronchitis, no wheezing, no hemoptysis, no asthma, no pain with inspiration or cough, + sleep apnea, + CPAP at night  GI:   no difficulty swallowing, no reflux, no frequent heartburn, no hiatal hernia, no abdominal pain, no constipation, + diarrhea, no hematochezia, no hematemesis, no melena  GU:   no dysuria,  no frequency, no urinary tract infection, no hematuria, no enlarged prostate, + kidney stones, no kidney disease  Vascular:  no pain suggestive of claudication, no pain in feet, no leg cramps, no varicose veins, no DVT, no non-healing foot  ulcer  Neuro:   no stroke, no TIA's, no seizures, no headaches, no temporary blindness one eye,  no slurred speech, no peripheral neuropathy, no chronic pain, no instability of gait, no memory/cognitive dysfunction  Musculoskeletal: no arthritis, no joint swelling, no myalgias, no difficulty walking, normal mobility   Skin:   no rash, no itching, no skin infections, no pressure sores or ulcerations  Psych:   no anxiety, + depression, no nervousness, no unusual recent stress  Eyes:   no blurry vision, no floaters, no  recent vision changes, no wears glasses or contacts  ENT:   no hearing loss, no loose or painful teeth, no dentures  Hematologic:  no easy bruising, no abnormal bleeding, no clotting disorder, no frequent epistaxis  Endocrine:  + diabetes, does  check CBG's at home     Physical Exam:   BP (!) 145/85   Pulse 72   Resp 20   Ht _0  (1.702 m)   Wt 93 kg   SpO2 96% Comment: RA  BMI 32.11 kg/m   General:  well-appearing  HEENT:  Unremarkable, NCAT, PERLA, EOMI  Neck:   no JVD, no bruits, no adenopathy   Chest:   clear to auscultation, symmetrical breath sounds, no wheezes, no rhonchi   CV:   RRR, no murmur   Abdomen:  soft, non-tender, no masses   Extremities:  warm, well-perfused, pulses palpable at ankles, no lower extremity edema  Rectal/GU  Deferred  Neuro:   Grossly non-focal and symmetrical throughout  Skin:   Clean and dry, no rashes, no breakdown  Diagnostic Tests:      ECHOCARDIOGRAM REPORT         Patient Name:   Douglas Phillips Date of Exam: 10/08/2021  Medical Rec #:  [id number]          Height:       67.0 in  Accession #:    [id number]         Weight:       211.4 lb  Date of Birth:  05-09-1976           BSA:          2.070 m  Patient Age:    78 years           BP:           156/83 mmHg  Patient Gender: M                  HR:           55 bpm.  Exam Location:  [address]   Procedure: 2D Echo, 3D Echo, Cardiac Doppler, Color Doppler and Strain   Analysis   Indications:    I71.9 Aortic aneurysm without rupture, unspecified portion  of                  aorta     History:        Patient has no prior history of Echocardiogram  examinations.                  CAD; Risk Factors:Sleep Apnea, Dyslipidemia and Diabetes.  CT                  performed 09/24/21 revealed aortic annulus of 33 mmm and  stj at                  38 mm and asc. ao greastest estimate 43 mm.     Sonographer:    Lenard Galloway BA, RDCS  Referring Phys: [place]     1. Global longitudinal strain is -20.6%.Marland Kitchen Left ventricular ejection  fraction, by estimation, is 55 to 60%. The left ventricle has normal  function. The left ventricle has no regional wall motion abnormalities.  There is mild left ventricular hypertrophy.  Left ventricular diastolic parameters are consistent with Grade I  diastolic dysfunction (impaired relaxation).   2. Right ventricular systolic function is normal. The right ventricular  size is normal.   3. Right atrial size was mildly dilated.   4. The mitral valve is normal in structure. Trivial mitral valve  regurgitation.   5. Aortic insufficiency is eccentric, directed posterior into LV. Marland Kitchen The  aortic valve is bicuspid. Aortic valve regurgitation is mild to moderate.   6. Aortic dilatation noted. There is severe dilatation of the aortic  root, measuring 52 mm. There is moderate dilatation of the ascending  aorta, measuring 45 mm. There is mild dilatation sinotubular junction,  measuring 43 mm.   7. The inferior vena cava is normal in size with greater than 50%  respiratory variability, suggesting right atrial pressure of 3 mmHg.   FINDINGS   Left Ventricle: Global longitudinal strain is -20.6%. Left ventricular  ejection fraction, by estimation, is 55 to 60%. The left ventricle has  normal function. The left ventricle has no regional wall motion  abnormalities. The left ventricular internal  cavity size was  normal in size. There is mild left ventricular  hypertrophy. Left ventricular diastolic parameters are consistent with  Grade I diastolic dysfunction (impaired relaxation).   Right Ventricle: The right ventricular size is normal. Right vetricular  wall thickness was not assessed. Right ventricular systolic function is  normal.   Left Atrium: Left atrial size was normal in size.   Right Atrium: Right atrial size was mildly dilated.   Pericardium: There is no evidence of pericardial effusion.   Mitral Valve: The mitral valve is normal in structure. Trivial mitral  valve regurgitation.   Tricuspid Valve: The tricuspid valve is normal in structure. Tricuspid  valve regurgitation is trivial.   Aortic Valve: Aortic insufficiency is eccentric, directed posterior into  LV. The aortic valve is bicuspid. Aortic valve regurgitation is mild to  moderate.   Pulmonic Valve: The pulmonic valve was normal in structure. Pulmonic valve  regurgitation is trivial.   Aorta: Aortic dilatation noted. There is severe dilatation of the aortic  root, measuring 52 mm. There is moderate dilatation of the ascending  aorta, measuring 45 mm. There is mild dilatation sinotubular junction,  measuring 43 mm.   Venous: The inferior vena cava is normal in size with greater than 50%  respiratory variability, suggesting right atrial pressure of 3 mmHg.   IAS/Shunts: No atrial level shunt detected by color flow Doppler.      LEFT VENTRICLE  PLAX 2D  LVIDd:         5.05 cm   Diastology  LVIDs:         3.30 cm   LV e' medial:    8.49 cm/s  LV PW:         1.35 cm   LV E/e' medial:  12.0  LV IVS:        1.25 cm   LV e' lateral:   9.79 cm/s  LVOT diam:     2.60 cm   LV E/e' lateral: 10.4  LV SV:         149  LV SV Index:   72        2D Longitudinal Strain  LVOT Area:     5.31 cm  2D Strain GLS (A2C):   -20.9 %                           2D Strain GLS (A3C):   -19.5 %  2D Strain GLS  (A4C):   -21.4 %                           2D Strain GLS Avg:     -20.6 %                              3D Volume EF:                           3D EF:        57 %                           LV EDV:       138 ml                           LV ESV:       59 ml                           LV SV:        79 ml   RIGHT VENTRICLE             IVC  RV Basal diam:  3.70 cm     IVC diam: 1.90 cm  RV Mid diam:    3.10 cm  RV S prime:     11.50 cm/s  TAPSE (M-mode): 2.4 cm  RVSP:           19.6 mmHg   LEFT ATRIUM             Index        RIGHT ATRIUM           Index  LA diam:        3.30 cm 1.59 cm/m   RA Pressure: 3.00 mmHg  LA Vol (A2C):   34.9 ml 16.86 ml/m  RA Area:     19.10 cm  LA Vol (A4C):   43.0 ml 20.77 ml/m  RA Volume:   61.30 ml  29.61 ml/m  LA Biplane Vol: 40.0 ml 19.32 ml/m   AORTIC VALVE  LVOT Vmax:   111.00 cm/s  LVOT Vmean:  75.500 cm/s  LVOT VTI:    0.280 m     AORTA  Ao Root diam:  5.30 cm  Ao Sinus diam: 5.00 cm  Ao STJ diam:   4.3 cm  Ao Asc diam:   4.50 cm   MITRAL VALVE                TRICUSPID VALVE  MV Area (PHT): 3.23 cm     TR Peak grad:   16.6 mmHg  MV Decel Time: 235 msec     TR Vmax:        204.00 cm/s  MV E velocity: 102.00 cm/s  Estimated RAP:  3.00 mmHg  MV A velocity: 104.00 cm/s  RVSP:           19.6 mmHg  MV E/A ratio:  0.98                              SHUNTS  Systemic VTI:  0.28 m                              Systemic Diam: 2.60 cm   Dorris Carnes MD  Electronically signed by Dorris Carnes MD  Signature Date/Time: 10/08/2021/8:11:48 PM         Final     Narrative & Impression  CLINICAL DATA:  44 year old male with a history of thoracic aortic aneurysm   EXAM: CT ANGIOGRAPHY CHEST WITH CONTRAST   TECHNIQUE: Multidetector CT imaging of the chest was performed using the standard protocol during bolus administration of intravenous contrast. Multiplanar CT image reconstructions and MIPs were obtained to evaluate  the vascular anatomy.   CONTRAST:  20m ISOVUE-370 IOPAMIDOL (ISOVUE-370) INJECTION 76%   COMPARISON:  09/01/2021   FINDINGS: Cardiovascular:   Heart:   No cardiomegaly. No pericardial fluid/thickening. Calcifications of the left anterior descending, circumflex, right coronary arteries.   Aorta:   No significant aortic valve calcifications.   Greatest estimated diameter of the aortic annulus 33 mm on the coronal reformatted images.   Greatest estimated diameter of the sino-tubular junction, 38 mm on the coronal images.   Greatest estimated diameter of the ascending aorta on the axial images, 43 mm.   No significant atherosclerotic changes of the aorta. Branch vessels are patent with common origin of the innominate artery and the left common carotid artery. Cervical cerebral vessels patent at the base of the neck.   No pedunculated plaque, ulcerated plaque, dissection, periaortic fluid. No wall thickening.   Pulmonary arteries:   Timing of the contrast bolus is not optimized for evaluation of pulmonary artery filling defects. Unremarkable size of the main pulmonary artery.   Mediastinum/Nodes: No mediastinal adenopathy. Unremarkable appearance of the thoracic esophagus.   Unremarkable appearance of the thoracic inlet.   Lungs/Pleura: Central airways are clear. No pleural effusion. No confluent airspace disease.   No pneumothorax.   Upper Abdomen: No acute finding of the upper abdomen.   Musculoskeletal: No acute displaced fracture. Degenerative changes of the spine.   Review of the MIP images confirms the above findings.   IMPRESSION: Estimated greatest diameter of the ascending aorta 4.3 cm. Recommend annual imaging followup by CTA or MRA. This recommendation follows 2010 ACCF/AHA/AATS/ACR/ASA/SCA/SCAI/SIR/STS/SVM Guidelines for the Diagnosis and Management of Patients with Thoracic Aortic Disease. Circulation. 2010; 121:: [id number] Aortic aneurysm NOS  (ICD10-I71.9)   Coronary artery disease without significant atherosclerotic changes of the aorta   Signed,   JDulcy Fanny WDellia Nims RPVI   [facility]   [facility]     Electronically Signed   By: JCorrie MckusickD.O.   On: 09/24/2021 12:18     Impression:  This 45year old gentleman has a bicuspid aortic valve with mild to moderate aortic insufficiency and a 5.2 to 5.3 cm aortic root aneurysm with a 4.5 cm ascending aortic aneurysm.  His echocardiogram shows normal left ventricular systolic function with an LV diastolic diameter of 5 cm.  His aortic root diameter is within the surgical range of 5 to 5.5 cm in patients with a bicuspid aortic valve.  He has mild to moderate aortic insufficiency and is asymptomatic so there is no indication for surgical replacement of his aortic valve at this time.  Given his relatively young age I think it is reasonable to continue following him for a while.  He should have a follow-up CTA of the chest/aorta with gating in  6 months to reevaluate the aortic root and get a more precise measurement.  He should also have his echocardiogram repeated within the next year to follow-up on his aortic insufficiency.  If his aorta increases any further in size then I would proceed with surgical treatment which would include replacement of his aortic valve, aortic root, and ascending aorta.  I reviewed the CT and echo images with him and answered all of his questions.  I stressed the importance of good blood pressure control in preventing further enlargement and acute aortic dissection.  I told him that he will require surgery, it is just a matter of when.  Plan:  He will return to see me in 6 months with a CTA of the chest/aorta with gating.  He will continue to follow-up with Dr. Harl Bowie concerning his bicuspid aortic valve insufficiency and she will decide when to do a repeat echocardiogram.  I spent 60 minutes performing  this consultation and > 50% of this time was spent face to face counseling and coordinating the care of this patient's bicuspid aortic valve with insufficiency and aortic root/ascending aortic aneurysm.   Gaye Pollack, MD 11/04/2021

## 2021-11-19 DIAGNOSIS — E7800 Pure hypercholesterolemia, unspecified: Secondary | ICD-10-CM | POA: Diagnosis not present

## 2021-11-19 DIAGNOSIS — E119 Type 2 diabetes mellitus without complications: Secondary | ICD-10-CM | POA: Diagnosis not present

## 2021-11-19 DIAGNOSIS — N529 Male erectile dysfunction, unspecified: Secondary | ICD-10-CM | POA: Diagnosis not present

## 2022-01-31 ENCOUNTER — Other Ambulatory Visit: Payer: Self-pay | Admitting: Nurse Practitioner

## 2022-01-31 DIAGNOSIS — E1165 Type 2 diabetes mellitus with hyperglycemia: Secondary | ICD-10-CM

## 2022-02-19 DIAGNOSIS — E559 Vitamin D deficiency, unspecified: Secondary | ICD-10-CM | POA: Diagnosis not present

## 2022-02-19 DIAGNOSIS — E119 Type 2 diabetes mellitus without complications: Secondary | ICD-10-CM | POA: Diagnosis not present

## 2022-02-19 DIAGNOSIS — N529 Male erectile dysfunction, unspecified: Secondary | ICD-10-CM | POA: Diagnosis not present

## 2022-02-19 DIAGNOSIS — Z79899 Other long term (current) drug therapy: Secondary | ICD-10-CM | POA: Diagnosis not present

## 2022-02-19 DIAGNOSIS — Z20822 Contact with and (suspected) exposure to covid-19: Secondary | ICD-10-CM | POA: Diagnosis not present

## 2022-02-19 DIAGNOSIS — E7800 Pure hypercholesterolemia, unspecified: Secondary | ICD-10-CM | POA: Diagnosis not present

## 2022-03-12 DIAGNOSIS — G4733 Obstructive sleep apnea (adult) (pediatric): Secondary | ICD-10-CM | POA: Diagnosis not present

## 2022-03-16 DIAGNOSIS — E113393 Type 2 diabetes mellitus with moderate nonproliferative diabetic retinopathy without macular edema, bilateral: Secondary | ICD-10-CM | POA: Diagnosis not present

## 2022-03-18 ENCOUNTER — Other Ambulatory Visit: Payer: Self-pay | Admitting: Surgery

## 2022-03-18 DIAGNOSIS — I7121 Aneurysm of the ascending aorta, without rupture: Secondary | ICD-10-CM

## 2022-03-21 ENCOUNTER — Other Ambulatory Visit: Payer: Self-pay | Admitting: Nurse Practitioner

## 2022-03-23 ENCOUNTER — Other Ambulatory Visit: Payer: Self-pay | Admitting: Thoracic Surgery (Cardiothoracic Vascular Surgery)

## 2022-04-01 ENCOUNTER — Telehealth (HOSPITAL_COMMUNITY): Payer: Self-pay | Admitting: Emergency Medicine

## 2022-04-01 NOTE — Telephone Encounter (Signed)
Reaching out to patient to offer assistance regarding upcoming cardiac imaging study; pt verbalizes understanding of appt date/time, parking situation and where to check in, pre-test NPO status and medications ordered, and verified current allergies; name and call back number provided for further questions should they arise ?Jenice Leiner RN Navigator Cardiac Imaging ?[facility] ?[phone] office ?[phone] cell ? ?Denies iv issues ?No meds ?Arrival 730 ?

## 2022-04-02 ENCOUNTER — Other Ambulatory Visit: Payer: Self-pay | Admitting: Cardiology

## 2022-04-02 ENCOUNTER — Ambulatory Visit (HOSPITAL_COMMUNITY)
Admission: RE | Admit: 2022-04-02 | Discharge: 2022-04-02 | Disposition: A | Discharge status: Home / Self Care | Payer: BC Managed Care – PPO | Source: Ambulatory Visit | Attending: Surgery | Admitting: Surgery

## 2022-04-02 ENCOUNTER — Ambulatory Visit (HOSPITAL_COMMUNITY)
Admission: RE | Admit: 2022-04-02 | Discharge: 2022-04-02 | Disposition: A | Discharge status: Home / Self Care | Payer: BC Managed Care – PPO | Source: Ambulatory Visit | Attending: Cardiology | Admitting: Cardiology

## 2022-04-02 DIAGNOSIS — R931 Abnormal findings on diagnostic imaging of heart and coronary circulation: Secondary | ICD-10-CM | POA: Diagnosis not present

## 2022-04-02 DIAGNOSIS — I2510 Atherosclerotic heart disease of native coronary artery without angina pectoris: Secondary | ICD-10-CM | POA: Diagnosis not present

## 2022-04-02 DIAGNOSIS — I7121 Aneurysm of the ascending aorta, without rupture: Secondary | ICD-10-CM | POA: Diagnosis not present

## 2022-04-02 MED ORDER — NITROGLYCERIN 0.4 MG SL SUBL
SUBLINGUAL_TABLET | SUBLINGUAL | Status: AC
  Filled 2022-04-02: qty 2

## 2022-04-02 MED ORDER — IOHEXOL 350 MG/ML SOLN: 100.0000 mL | Freq: Once | INTRAVENOUS | Status: DC | PRN

## 2022-04-02 MED ORDER — NITROGLYCERIN 0.4 MG SL SUBL
0.8000 mg | SUBLINGUAL_TABLET | Freq: Once | SUBLINGUAL | Status: AC
  Administered 2022-04-02: 0.8 mg via SUBLINGUAL

## 2022-04-02 MED ORDER — IOHEXOL 350 MG/ML SOLN
100.0000 mL | Freq: Once | INTRAVENOUS | Status: AC | PRN
  Administered 2022-04-02: 100 mL via INTRAVENOUS

## 2022-04-28 ENCOUNTER — Ambulatory Visit (INDEPENDENT_AMBULATORY_CARE_PROVIDER_SITE_OTHER): Payer: BC Managed Care – PPO | Admitting: Surgery

## 2022-04-28 ENCOUNTER — Encounter: Payer: Self-pay | Admitting: Surgery

## 2022-04-28 VITALS — BP 149/80 | HR 73 | Resp 20 | Ht 67.0 in | Wt 240.0 lb

## 2022-04-28 DIAGNOSIS — I7121 Aneurysm of the ascending aorta, without rupture: Secondary | ICD-10-CM | POA: Diagnosis not present

## 2022-05-01 NOTE — Progress Notes (Signed)
HPI:  The patient is a 46 year old gentleman who returns for followup of a bicuspid aortic valve with mild to moderate aortic insufficiency and a 5.2 to 5.3 cm aortic root aneurysm with a 4.5 cm ascending aortic aneurysm.  His echocardiogram on 10/08/21 showed normal left ventricular systolic function with an LV diastolic diameter of 5 cm. He continues to feels well with no shortness of breath or fatigue. He denies any chest or back pain.  There is no family history of aortic aneurysm, bicuspid aortic valve disease, or aortic dissection.  Current Outpatient Medications  Medication Sig Dispense Refill   atorvastatin (LIPITOR) 40 MG tablet Take 1 tablet (40 mg total) by mouth daily. 90 tablet 3   blood glucose meter kit and supplies Dispense based on patient and insurance preference. Use one time daily. ICD10: E11.65 1 each 0   glipiZIDE (GLUCOTROL XL) 5 MG 24 hr tablet Take 1 tablet (5 mg total) by mouth daily with breakfast. 90 tablet 0   glucose blood (ONETOUCH VERIO) test strip 1 each by Other route in the morning and at bedtime. Use as instructed 100 each 1   pioglitazone (ACTOS) 15 MG tablet Take 0.5 tablets (7.5 mg total) by mouth daily. With food 45 tablet 0   fenofibrate 54 MG tablet Take 1 tablet (54 mg total) by mouth daily. (Patient not taking: Reported on 04/28/2022) 90 tablet 1   No current facility-administered medications for this visit.     Physical Exam: BP (!) 149/80   Pulse 73   Resp 20   Ht 5' 7" (1.702 m)   Wt 240 lb (108.9 kg)   SpO2 98% Comment: RA  BMI 37.59 kg/m  He looks well Cardiac exam shows a RRR with normal heart sounds and no murmur. Lungs are clear  Diagnostic Tests:  ADDENDUM REPORT: 04/02/2022 15:54   CLINICAL DATA:  This is a 46 year old male with anginal symptoms.   EXAM: Cardiac/Coronary  CTA   TECHNIQUE: The patient was scanned on a [employer].   FINDINGS: A 100 kV prospective scan was triggered in the descending  thoracic aorta at 111 HU's. Axial non-contrast 3 mm slices were carried out through the heart. The data set was analyzed on a dedicated work station and scored using the [place]. Gantry rotation speed was 250 msecs and collimation was .6 mm. No beta blockade and 0.8 mg of sl NTG was given. The 3D data set was reconstructed in 5% intervals of the 67-82 % of the R-R cycle. Diastolic phases were analyzed on a dedicated work station using MPR, MIP and VRT modes. The patient received 80 cc of contrast.   Image quality: Fair, with misregistration artifact.   Aorta: Proximal ascending aorta is aneurysmal (46.6 mm). Mild aortic root calcifications. No dissection.   Aortic Valve:  Trileaflet.  No calcifications.   Coronary Arteries:  Normal coronary origin.  Right dominance.   RCA is a large dominant artery that gives rise to PDA and PLA. Mild (25-49%) calcified plaque in the proximal RCA. At the border of the proximal to mid RCA lies a focal mild soft plaque. The mid to distal RCA with a moderate (50-69%) calcified plaque. In the distal RCA above the bifurcation is a mild soft plaque. The distal portion of the PLA has a soft plaque which can not be quantified due to poor visualization of that portion of the vessel. The proximal portion of the PDA with minimal calcifications. There  is a focal minimal mixed plaque in the mid PDA. Distal PDA not well visualized.   Left main is a large artery that gives rise to LAD and LCX arteries.   LAD is a large vessel. There is a diffuse mild (25-49%) calcified plaques in the proximal LAD. The mid LAD with a focal mild soft plaque and mild diffuse calcified plaques. The distal LAD is not well visualized due to misregistration artifact but there is a noted focal soft plaque with minimal calcified plaque.   LCX is a non-dominant artery that gives rise to one large OM1 branch. There is minimal calcification in the proximal LCX artery. The mid LCX  with 3 separate focal calcified plaques. The distal LCX with moderate calcified plaque and as it tapers of there are minimal calcified plaques.   Coronary Calcium Score:   Left main: 0   Left anterior descending artery: 432   Left circumflex artery: 604   Right coronary artery: 556   Total: 1592   Percentile: 99   Other findings:   Normal pulmonary vein drainage into the left atrium.   Normal left atrial appendage without a thrombus.   Normal size of the pulmonary artery.   IMPRESSION: 1. Coronary calcium score of 1592. This was 43 percentile for age and sex matched control.   2. Normal coronary origin with right dominance.   3. CAD-RADS 3. Moderate stenosis. Consider symptom-guided anti-ischemic pharmacotherapy as well as risk factor modification per guideline directed care. Additional analysis with CT FFR will be submitted.   The noncardiac portion of this study will be interpreted in separate report by the radiologist.     Electronically Signed   By: Berniece Salines D.O.   On: 04/02/2022 15:54    Addended by Berniece Salines, DO on 04/02/2022  3:57 PM   Study Result  Narrative & Impression  EXAM: OVER-READ INTERPRETATION  CT CHEST   The following report is an over-read performed by radiologist Dr. Vinnie Langton of [facility], [place] on 04/02/2022. This over-read does not include interpretation of cardiac or coronary anatomy or pathology. The coronary calcium score/coronary CTA interpretation by the cardiologist is attached.   COMPARISON:  None.   FINDINGS: Extracardiac findings will be described separately under dictation for contemporaneously obtained chest CTA dated 04/02/2022.   IMPRESSION: Please see separate dictation for contemporaneously obtained chest CTA 04/02/2022 for full description of relevant extracardiac findings.   Electronically Signed: By: Vinnie Langton M.D. On: 04/02/2022 09:14      EXAM: CT FFR ANALYSIS   CLINICAL  DATA:  Coronary artery disease (CAD)   FINDINGS: FFRct analysis was performed on the original cardiac CT angiogram dataset. Diagrammatic representation of the FFRct analysis is provided in a separate PDF document in PACS. This dictation was created using the PDF document and an interactive 3D model of the results. 3D model is not available in the EMR/PACS. Normal FFR range is >0.80. Indeterminate (grey) zone is 0.76-0.80.   1. Left Main: FFR = 0.94   2. LAD: Proximal FFR = 0.94, mid FFR = 0.90, distal FFR = not analyzed 3. LCX: Proximal FFR = 0.99, distal FFR = 0.92 4. RCA: Proximal FFR = 0.96, mid FFR =0.95, distal FFR = 0.93   IMPRESSION: 1.  CT FFR analysis showed no significant stenosis.   RECOMMENDATIONS: Guideline-directed medical therapy and aggressive risk factor modification for secondary prevention of coronary artery disease.     Electronically Signed   By: Carmelia Bake.O.  On: 04/02/2022 18:59    Narrative & Impression  CLINICAL DATA:  46 year old male with history of thoracic aortic aneurysm. Follow-up study.   EXAM: CT ANGIOGRAPHY CHEST WITH CONTRAST   TECHNIQUE: Multidetector CT imaging of the chest was performed using the standard protocol during bolus administration of intravenous contrast. Multiplanar CT image reconstructions and MIPs were obtained to evaluate the vascular anatomy.   RADIATION DOSE REDUCTION: This exam was performed according to the departmental dose-optimization program which includes automated exposure control, adjustment of the mA and/or kV according to patient size and/or use of iterative reconstruction technique.   CONTRAST:  123m OMNIPAQUE IOHEXOL 350 MG/ML SOLN   COMPARISON:  Chest CTA 09/24/2021.   FINDINGS: Cardiovascular: Aneurysmal dilatation of the proximal ascending thoracic aorta (4.7 cm in diameter), increased compared to prior study (previously 4.3 cm). Heart size is normal. There is no significant  pericardial fluid, thickening or pericardial calcification. There is aortic atherosclerosis, as well as atherosclerosis of the great vessels of the mediastinum and the coronary arteries, including calcified atherosclerotic plaque in the left main, left anterior descending, left circumflex and right coronary arteries.   Mediastinum/Nodes: No pathologically enlarged mediastinal or hilar lymph nodes. Hilar esophagus is unremarkable in appearance. No axillary lymphadenopathy.   Lungs/Pleura: No acute consolidative airspace disease. No pleural effusions. No suspicious appearing pulmonary nodules or masses are noted.   Upper Abdomen: Unremarkable.   Musculoskeletal: There are no aggressive appearing lytic or blastic lesions noted in the visualized portions of the skeleton.   Review of the MIP images confirms the above findings.   IMPRESSION: 1. Aneurysmal dilatation of the ascending thoracic aorta, increased compared to the prior study, currently measuring 4.7 cm in diameter. Ascending thoracic aortic aneurysm. Recommend semi-annual imaging followup by CTA or MRA and referral to cardiothoracic surgery if not already obtained. This recommendation follows 2010 ACCF/AHA/AATS/ACR/ASA/SCA/SCAI/SIR/STS/SVM Guidelines for the Diagnosis and Management of Patients With Thoracic Aortic Disease. Circulation. 2010; 121:: H680-S811 Aortic aneurysm NOS (ICD10-I71.9). 2. Aortic atherosclerosis, in addition to left main and three-vessel coronary artery disease. Please note that although the presence of coronary artery calcium documents the presence of coronary artery disease, the severity of this disease and any potential stenosis cannot be assessed on this non-gated CT examination. Assessment for potential risk factor modification, dietary therapy or pharmacologic therapy may be warranted, if clinically indicated.   Aortic Atherosclerosis (ICD10-I70.0). Aortic aneurysm NOS (ICD10-I71.9).      Electronically Signed   By: DVinnie LangtonM.D.   On: 04/02/2022 09:32     Impression:  This 46year old gentleman has a bicuspid aortic valve with mild to moderate AI and normal LV function and dimensions. He also has a 5.2 cm sinus diameter on gated cardiac CTA which is unchanged and a 4.7 cm ascending aorta on CTA of the chest which is slightly larger than on his prior CTA in October 2022 when it was measured at 4.3 cm. His echo has not been repeated. Coronary calcium score was 1592 but FFR was normal in all three coronary vessels. His aortic root diameter is within the surgical range of 5 to 5.5 cm in patients with a bicuspid aortic valve. It has not changed from the last study although the ascending aorta is slightly larger. I reviewed the studies with him and answered his questions. I think it its reasonable to continue following this closely for now since he has no additional risk factors for aortic dissection.   I stressed the importance of good blood pressure  control in preventing further enlargement and acute aortic dissection. I advised him against doing any heavy lifting that may require a valsalva maneuver and could suddenly raise his BP to high levels.  Plan:  I will see him back in 6 months with a CTA of the chest and 2D echo.  I spent 20 minutes performing this established patient evaluation and > 50% of this time was spent face to face counseling and coordinating the care of this patient's bicuspid aortic valve and aortic aneurysm.    Gaye Pollack, MD [facility] [phone]

## 2022-05-21 DIAGNOSIS — E7800 Pure hypercholesterolemia, unspecified: Secondary | ICD-10-CM | POA: Diagnosis not present

## 2022-05-21 DIAGNOSIS — Z79899 Other long term (current) drug therapy: Secondary | ICD-10-CM | POA: Diagnosis not present

## 2022-05-21 DIAGNOSIS — E559 Vitamin D deficiency, unspecified: Secondary | ICD-10-CM | POA: Diagnosis not present

## 2022-05-21 DIAGNOSIS — E119 Type 2 diabetes mellitus without complications: Secondary | ICD-10-CM | POA: Diagnosis not present

## 2022-05-21 DIAGNOSIS — Z20822 Contact with and (suspected) exposure to covid-19: Secondary | ICD-10-CM | POA: Diagnosis not present

## 2022-05-21 DIAGNOSIS — G4733 Obstructive sleep apnea (adult) (pediatric): Secondary | ICD-10-CM | POA: Diagnosis not present

## 2022-05-21 DIAGNOSIS — I10 Essential (primary) hypertension: Secondary | ICD-10-CM | POA: Diagnosis not present

## 2022-05-21 DIAGNOSIS — R300 Dysuria: Secondary | ICD-10-CM | POA: Diagnosis not present

## 2022-06-28 DIAGNOSIS — N529 Male erectile dysfunction, unspecified: Secondary | ICD-10-CM | POA: Diagnosis not present

## 2022-07-06 DIAGNOSIS — N200 Calculus of kidney: Secondary | ICD-10-CM | POA: Diagnosis not present

## 2022-07-26 DIAGNOSIS — N529 Male erectile dysfunction, unspecified: Secondary | ICD-10-CM | POA: Diagnosis not present

## 2022-08-20 DIAGNOSIS — E291 Testicular hypofunction: Secondary | ICD-10-CM | POA: Diagnosis not present

## 2022-08-20 DIAGNOSIS — E7800 Pure hypercholesterolemia, unspecified: Secondary | ICD-10-CM | POA: Diagnosis not present

## 2022-08-20 DIAGNOSIS — Z79899 Other long term (current) drug therapy: Secondary | ICD-10-CM | POA: Diagnosis not present

## 2022-08-20 DIAGNOSIS — E119 Type 2 diabetes mellitus without complications: Secondary | ICD-10-CM | POA: Diagnosis not present

## 2022-08-20 DIAGNOSIS — R5383 Other fatigue: Secondary | ICD-10-CM | POA: Diagnosis not present

## 2022-08-20 DIAGNOSIS — E559 Vitamin D deficiency, unspecified: Secondary | ICD-10-CM | POA: Diagnosis not present

## 2022-09-03 DIAGNOSIS — K580 Irritable bowel syndrome with diarrhea: Secondary | ICD-10-CM | POA: Diagnosis not present

## 2022-09-03 DIAGNOSIS — R140 Abdominal distension (gaseous): Secondary | ICD-10-CM | POA: Diagnosis not present

## 2022-09-14 ENCOUNTER — Other Ambulatory Visit: Payer: Self-pay | Admitting: Surgery

## 2022-09-14 DIAGNOSIS — Z952 Presence of prosthetic heart valve: Secondary | ICD-10-CM

## 2022-09-14 DIAGNOSIS — I712 Thoracic aortic aneurysm, without rupture, unspecified: Secondary | ICD-10-CM

## 2022-09-14 NOTE — Progress Notes (Signed)
2

## 2022-09-15 ENCOUNTER — Other Ambulatory Visit: Payer: Self-pay | Admitting: Surgery

## 2022-09-15 DIAGNOSIS — I25119 Atherosclerotic heart disease of native coronary artery with unspecified angina pectoris: Secondary | ICD-10-CM

## 2022-09-15 NOTE — Progress Notes (Signed)
bmet  

## 2022-09-28 ENCOUNTER — Ambulatory Visit (HOSPITAL_COMMUNITY): Payer: BC Managed Care – PPO

## 2022-09-28 DIAGNOSIS — E113393 Type 2 diabetes mellitus with moderate nonproliferative diabetic retinopathy without macular edema, bilateral: Secondary | ICD-10-CM | POA: Diagnosis not present

## 2022-09-28 DIAGNOSIS — H579 Unspecified disorder of eye and adnexa: Secondary | ICD-10-CM | POA: Diagnosis not present

## 2022-10-04 ENCOUNTER — Ambulatory Visit (HOSPITAL_COMMUNITY)
Admission: RE | Admit: 2022-10-04 | Discharge: 2022-10-04 | Disposition: A | Discharge status: Home / Self Care | Payer: BC Managed Care – PPO | Source: Ambulatory Visit | Attending: Surgery | Admitting: Surgery

## 2022-10-04 DIAGNOSIS — E785 Hyperlipidemia, unspecified: Secondary | ICD-10-CM | POA: Insufficient documentation

## 2022-10-04 DIAGNOSIS — I350 Nonrheumatic aortic (valve) stenosis: Secondary | ICD-10-CM | POA: Diagnosis not present

## 2022-10-04 DIAGNOSIS — E119 Type 2 diabetes mellitus without complications: Secondary | ICD-10-CM | POA: Diagnosis not present

## 2022-10-04 DIAGNOSIS — I351 Nonrheumatic aortic (valve) insufficiency: Secondary | ICD-10-CM | POA: Diagnosis not present

## 2022-10-04 DIAGNOSIS — I712 Thoracic aortic aneurysm, without rupture, unspecified: Secondary | ICD-10-CM

## 2022-10-04 DIAGNOSIS — I10 Essential (primary) hypertension: Secondary | ICD-10-CM | POA: Insufficient documentation

## 2022-10-04 LAB — ECHOCARDIOGRAM COMPLETE
AR max vel: 3.56 cm2
AV Area VTI: 4.33 cm2
AV Area mean vel: 4.38 cm2
AV Mean grad: 4 mmHg
AV Peak grad: 9.1 mmHg
Ao pk vel: 1.51 m/s
Area-P 1/2: 3.02 cm2
P 1/2 time: 1041 msec
Radius: 0.6 cm
S' Lateral: 3.5 cm

## 2022-10-05 ENCOUNTER — Other Ambulatory Visit: Payer: Self-pay | Admitting: Nurse Practitioner

## 2022-10-05 DIAGNOSIS — E1165 Type 2 diabetes mellitus with hyperglycemia: Secondary | ICD-10-CM

## 2022-10-05 DIAGNOSIS — H43823 Vitreomacular adhesion, bilateral: Secondary | ICD-10-CM | POA: Diagnosis not present

## 2022-10-05 DIAGNOSIS — E113413 Type 2 diabetes mellitus with severe nonproliferative diabetic retinopathy with macular edema, bilateral: Secondary | ICD-10-CM | POA: Diagnosis not present

## 2022-10-05 DIAGNOSIS — H35033 Hypertensive retinopathy, bilateral: Secondary | ICD-10-CM | POA: Diagnosis not present

## 2022-10-06 ENCOUNTER — Other Ambulatory Visit: Payer: Self-pay | Admitting: Surgery

## 2022-10-06 DIAGNOSIS — I2510 Atherosclerotic heart disease of native coronary artery without angina pectoris: Secondary | ICD-10-CM

## 2022-10-08 DIAGNOSIS — I10 Essential (primary) hypertension: Secondary | ICD-10-CM | POA: Diagnosis not present

## 2022-10-08 DIAGNOSIS — G43909 Migraine, unspecified, not intractable, without status migrainosus: Secondary | ICD-10-CM | POA: Diagnosis not present

## 2022-10-15 DIAGNOSIS — R140 Abdominal distension (gaseous): Secondary | ICD-10-CM | POA: Diagnosis not present

## 2022-10-15 DIAGNOSIS — K625 Hemorrhage of anus and rectum: Secondary | ICD-10-CM | POA: Diagnosis not present

## 2022-10-15 DIAGNOSIS — R197 Diarrhea, unspecified: Secondary | ICD-10-CM | POA: Diagnosis not present

## 2022-10-18 DIAGNOSIS — R197 Diarrhea, unspecified: Secondary | ICD-10-CM | POA: Diagnosis not present

## 2022-10-28 DIAGNOSIS — E119 Type 2 diabetes mellitus without complications: Secondary | ICD-10-CM | POA: Diagnosis not present

## 2022-10-28 DIAGNOSIS — K635 Polyp of colon: Secondary | ICD-10-CM | POA: Diagnosis not present

## 2022-10-28 DIAGNOSIS — Z1211 Encounter for screening for malignant neoplasm of colon: Secondary | ICD-10-CM | POA: Diagnosis not present

## 2022-10-28 DIAGNOSIS — K625 Hemorrhage of anus and rectum: Secondary | ICD-10-CM | POA: Diagnosis not present

## 2022-11-08 ENCOUNTER — Ambulatory Visit: Payer: BC Managed Care – PPO | Attending: Internal Medicine | Admitting: Internal Medicine

## 2022-11-08 ENCOUNTER — Encounter: Payer: Self-pay | Admitting: Internal Medicine

## 2022-11-08 VITALS — BP 140/80 | HR 47 | Ht 67.0 in | Wt 254.8 lb

## 2022-11-08 DIAGNOSIS — I2510 Atherosclerotic heart disease of native coronary artery without angina pectoris: Secondary | ICD-10-CM | POA: Diagnosis not present

## 2022-11-08 NOTE — Patient Instructions (Addendum)
Medication Instructions:  No Changes In Medications at this time.  *If you need a refill on your cardiac medications before your next appointment, please call your pharmacy*  Lab Work: None Ordered At This Time.  If you have labs (blood work) drawn today and your tests are completely normal, you will receive your results only by: MyChart Message (if you have MyChart) OR A paper copy in the mail If you have any lab test that is abnormal or we need to change your treatment, we will call you to review the results.  Testing/Procedures: None Ordered At This Time.   Follow-Up: At [facility], you and your health needs are our priority.  As part of our continuing mission to provide you with exceptional heart care, we have created designated Provider Care Teams.  These Care Teams include your primary Cardiologist (physician) and Advanced Practice Providers (APPs -  Physician Assistants and Nurse Practitioners) who all work together to provide you with the care you need, when you need it.  Your next appointment:   6 month(s)  The format for your next appointment:   In Person  Provider:  Dr. Wyline Mood   Other Instructions Please write down your blood pressures for the next two weeks, once a day.  Please take it at the same time each day. Please type your log in my chart.

## 2022-11-08 NOTE — Progress Notes (Signed)
Cardiology Office Note:    Date:  11/08/2022   ID:  [name], DOB 1976/08/18, MRN [id number]  PCP:  Karle Plumber, MD   [facility] Providers Cardiologist:  None     Referring MD: Karle Plumber, MD   No chief complaint on file. CVD Risk  History of Present Illness:    Douglas Phillips is a 46 y.o. male with a hx of DMII 9.0 (13.5), referral to cardiology s/p CT scan with high CAC 1470, predominantly in the Lcx (768) and LAD (278), Estimated greatest diameter of the ascending aorta 4.3 cm, of ascending aorta 09/24/2021  Douglas Phillips was referred by his primary provider 2/2 high CAC and ascending aortic aneurysm. The CT scan was sent 2/2 concern on an abdominal CT scan he received for renal stone 04/2021. It showed increased CAC premature for age.  He has no history of CVD. He started atorvastatin which was reduced from 40 mg to 10 mg. He denies chest pain or dyspnea on exertion. He denies PND, no lower extremity edema.  Social History: no smoking. No drug use. He is fairly sedentary, he is increasing his physical activity.  Family History: Paternal Grandfather 2-3 myocardial infarctions, early to mid 71s. Mother is healthy. Sister is healthy. Niece born with hole in the heart. No history aortic aneurysm or dissection.  Surgical History: Discectomies  TG - 60 ( from up to 600s-fenofibrate, he couldn't take) LDL 56 Normal LFTs  Interim Hx: No new symptoms today. Planning to see Dr. Laneta Simmers soon  Past Medical History:  Diagnosis Date   Diabetes mellitus without complication (HCC)    High cholesterol    Hypertension    Kidney stones    Migraine    MIGRAINE HEADACHE 04/05/2007   Qualifier: Diagnosis of  By: Drue Novel MD, Nolon Rod.    Non-restorative sleep 03/12/2019    Past Surgical History:  Procedure Laterality Date   BACK SURGERY     ruptured L4 and L5   MOUTH SURGERY      Current Medications: No outpatient medications have been marked as taking for the  11/08/22 encounter (Appointment) with Maisie Fus, MD.     Allergies:   Patient has no known allergies.   Social History   Socioeconomic History   Marital status: Married    Spouse name: Not on file   Number of children: Not on file   Years of education: Not on file   Highest education level: Not on file  Occupational History   Not on file  Tobacco Use   Smoking status: Never   Smokeless tobacco: Never  Vaping Use   Vaping Use: Never used  Substance and Sexual Activity   Alcohol use: No   Drug use: No   Sexual activity: Not on file  Other Topics Concern   Not on file  Social History Narrative   Not on file   Social Determinants of Health   Financial Resource Strain: Not on file  Food Insecurity: Not on file  Transportation Needs: Not on file  Physical Activity: Not on file  Stress: Not on file  Social Connections: Not on file     Family History: The patient's family history includes Heart attack in his paternal grandfather; Hypertension in his father and paternal grandfather; Liver cancer in his maternal grandmother; Osteoarthritis in his mother. There is no history of Colon cancer or Prostate cancer.  ROS:   Please see the history of present illness.  All other systems reviewed and are negative.  EKGs/Labs/Other Studies Reviewed:    The following studies were reviewed today:   EKG:  EKG is  ordered today.  The ekg ordered today demonstrates   Sinus bradycardia 56 bpm LAD  11/08/2022- sinus bradycardia 47 bpm  Recent Labs: No results found for requested labs within last 365 days.  Recent Lipid Panel    Component Value Date/Time   CHOL 112 06/04/2021 0807   TRIG 60.0 06/04/2021 0807   HDL 44.10 06/04/2021 0807   CHOLHDL 3 06/04/2021 0807   VLDL 12.0 06/04/2021 0807   LDLCALC 56 06/04/2021 0807   LDLDIRECT 100.0 03/06/2021 1052     Risk Assessment/Calculations:     Physical Exam:    VS:    Vitals:   11/08/22 0816  BP: (!) 140/80   Pulse: (!) 47  SpO2: 97%     Wt Readings from Last 3 Encounters:  04/28/22 240 lb (108.9 kg)  11/04/21 205 lb (93 kg)  10/07/21 211 lb 6.4 oz (95.9 kg)     GEN:  Well nourished, well developed in no acute distress HEENT: Normal NECK: No JVD; No carotid bruits LYMPHATICS: No lymphadenopathy CARDIAC: RRR, no murmurs, rubs, gallops RESPIRATORY:  Clear to auscultation without rales, wheezing or rhonchi  ABDOMEN: Soft, non-tender, non-distended MUSCULOSKELETAL:  No edema; No deformity  SKIN: Warm and dry NEUROLOGIC:  Alert and oriented x 3 PSYCHIATRIC:  Normal affect   ASSESSMENT:    #CVD Risk: No symptoms of angina. Initially saw him for high Cac. His CAC is substantial at 1470. His other risk factors include diabetes and obesity.  I counseled him on diet. We discussed stopping red meat and consider more plant based. He does not smoke. Increased his atorvastatin to 40 mg , LDL at goal 56 mg/dL 08/9241. We discussed starting with exercise, planning walking  DM2: A1c 5.9% 10/12/2021. on insulin, A1c much improved. Discussed changing pioglitazone to farxiga; will discuss with his PCP  #Aortic Aneurysm: BAV, moderate AI. Aortic root 5.2 to 5.3 cm. No signs of marfans or family history of dissection. Surveillance per CT SUR.   #HTN: recommend home BP measurements.   PLAN:    In order of problems listed above:  Home BP measurements, if consistently >130/80 mmHg will assess need for BP meds Will discuss with his PCP to consider changing pioglitazone to farxiga Follow up 6 months   Medication Adjustments/Labs and Tests Ordered: Current medicines are reviewed at length with the patient today.  Concerns regarding medicines are outlined above.    Signed, Maisie Fus, MD  11/08/2022 8:04 AM    [facility]

## 2022-11-09 DIAGNOSIS — K635 Polyp of colon: Secondary | ICD-10-CM | POA: Diagnosis not present

## 2022-11-10 ENCOUNTER — Ambulatory Visit: Payer: BC Managed Care – PPO | Admitting: Surgery

## 2022-11-12 ENCOUNTER — Other Ambulatory Visit: Payer: BC Managed Care – PPO

## 2022-11-17 ENCOUNTER — Ambulatory Visit: Payer: BC Managed Care – PPO | Admitting: Surgery

## 2022-11-17 NOTE — Progress Notes (Unsigned)
Referring:  Douglas Phillips, [address] [place],   [id number]  PCP: Guadlupe Spanish, MD  Neurology was asked to evaluate Douglas Phillips, a 46 year old male for a chief complaint of headaches.  Our recommendations of care will be communicated by shared medical record.    CC:  headaches  History provided from self  HPI:  Medical co-morbidities: CAD, HTN, ascending aortic aneurysm, OSA on CPAP, DM, HLD, nephrolithiasis  The patient presents for evaluation of migraines and vision changes. He has a history of migraine with visual aura since he was 46 years old. Migraines were relatively well-controlled until this year. Has had 10 migraines in the past year. In the past 2 months, he has developed persistent vision changes without the migraine headache. This is described as his typical aura pattern of circular starbursts and prisms in both eyes. It does not move like his typical aura does. Vision changes are always present to some degree, but are worse when he hasn't gotten sleep or if he is staring at computer screens. He has not had any migraine headaches in the past 2 months but does report photophobia.  He saw recently ophthalmology for vision changes who noted severe diabetic retinopathy with macular edema.  Headache History: Onset: 2 months ago Aura: blind spots in vision Associated Symptoms:  Photophobia: yes, at baseline  Phonophobia: no  Nausea: no Worse with activity?: yes  Current Treatment: Abortive Goody's Powder  Preventative none  Prior Therapies                                 Goody's Powder Imitrex 50 mg PRN Triptans contraindicated due to CAD  LABS: CBC    Component Value Date/Time   WBC 7.4 04/17/2021 0840   RBC 4.34 04/17/2021 0840   HGB 13.2 04/17/2021 0840   HCT 39.2 04/17/2021 0840   PLT 178 04/17/2021 0840   MCV 90.3 04/17/2021 0840   MCH 30.4 04/17/2021 0840   MCHC 33.7 04/17/2021 0840   RDW 11.6 04/17/2021 0840    LYMPHSABS 1.3 04/17/2021 0840   MONOABS 0.6 04/17/2021 0840   EOSABS 0.1 04/17/2021 0840   BASOSABS 0.0 04/17/2021 0840      Latest Ref Rng & Units 10/12/2021    8:31 AM 06/04/2021    8:07 AM 04/17/2021    8:40 AM  CMP  Glucose 70 - 99 mg/dL 132  130  248   BUN 6 - 23 mg/dL _0 Creatinine 0.40 - 1.50 mg/dL 0.80  0.80  1.40   Sodium 135 - 145 mEq/L 143  142  139   Potassium 3.5 - 5.1 mEq/L 4.0  4.1  4.9   Chloride 96 - 112 mEq/L 106  105  103   CO2 19 - 32 mEq/L _1 Calcium 8.4 - 10.5 mg/dL 9.0  9.4  9.6   Total Protein 6.0 - 8.3 g/dL  6.7    Total Bilirubin 0.2 - 1.2 mg/dL  0.6    Alkaline Phos 39 - 117 U/L  36    AST 0 - 37 U/L  12    ALT 0 - 53 U/L  17       IMAGING:  None  Current Outpatient Medications on File Prior to Visit  Medication Sig Dispense Refill   atorvastatin (LIPITOR) 40 MG tablet Take 1 tablet (40  mg total) by mouth daily. 90 tablet 3   blood glucose meter kit and supplies Dispense based on patient and insurance preference. Use one time daily. ICD10: E11.65 1 each 0   fenofibrate 54 MG tablet Take 1 tablet (54 mg total) by mouth daily. 90 tablet 1   glipiZIDE (GLUCOTROL XL) 5 MG 24 hr tablet Take 1 tablet (5 mg total) by mouth daily with breakfast. 90 tablet 0   glucose blood (ONETOUCH VERIO) test strip 1 each by Other route in the morning and at bedtime. Use as instructed 100 each 1   NOVOLIN 70/30 KWIKPEN (70-30) 100 UNIT/ML KwikPen SMARTSIG:20 Unit(s) SUB-Q Twice Daily     pioglitazone (ACTOS) 15 MG tablet Take 0.5 tablets (7.5 mg total) by mouth daily. With food 45 tablet 0   VITAMIN D PO Take by mouth.     No current facility-administered medications on file prior to visit.     Allergies: No Known Allergies  Family History: Migraine or other headaches in the family:  grandfather Aneurysms in a first degree relative:  no Brain tumors in the family:  no Other neurological illness in the family:   no  Past Medical History: Past  Medical History:  Diagnosis Date   Diabetes mellitus without complication ([place])    High cholesterol    Hypertension    Kidney stones    Migraine    MIGRAINE HEADACHE 04/05/2007   Qualifier: Diagnosis of  By: Larose Kells MD, [place] [facility] 03/12/2019    Past Surgical History Past Surgical History:  Procedure Laterality Date   BACK SURGERY     ruptured L4 and L5   MOUTH SURGERY      Social History: Social History   Tobacco Use   Smoking status: Never   Smokeless tobacco: Never  Vaping Use   Vaping Use: Never used  Substance Use Topics   Alcohol use: No   Drug use: No    ROS: Negative for fevers, chills. Positive for headaches. All other systems reviewed and negative unless stated otherwise in HPI.   Physical Exam:   Vital Signs: BP 139/74   Pulse (!) 49   Ht _0  (1.702 m)   Wt 255 lb (115.7 kg)   BMI 39.94 kg/m  GENERAL: well appearing,in no acute distress,alert SKIN:  Color, texture, turgor normal. No rashes or lesions HEAD:  Normocephalic/atraumatic. CV:  RRR RESP: Normal respiratory effort MSK: no tenderness to palpation over occiput, neck, or shoulders  NEUROLOGICAL: Mental Status: Alert, oriented to person, place and time,Follows commands Cranial Nerves: PERRL, visual fields intact to confrontation, extraocular movements intact, facial sensation intact, no facial droop or ptosis, hearing grossly intact, no dysarthria Motor: muscle strength 5/5 both upper and lower extremities,no drift, normal tone Reflexes: 2+ throughout Sensation: intact to light touch all 4 extremities Coordination: Finger-to- nose-finger intact bilaterally Gait: normal-based   IMPRESSION: 46 year old male with a history of CAD, HTN, ascending aortic aneurysm, OSA on CPAP, DM, HLD, nephrolithiasis who presents for evaluation of migraines and vision changes. Will order brain MRI to assess for structural causes of persistent visual changes. If this is normal, suspect his  symptoms represent persistent migraine aura without infarction. Will start Lamictal for migraine aura prevention. Advised that daily magnesium may also help reduce migraine aura frequency.  PLAN: -MRI brain -Start Lamictal and uptitrate to 100 mg BID:   Week Morning # of pills (mg) Evening # of pills (mg)  Week 1 0 1   (  53m)  Week 2 0 1   (268m  Week 3 1   (2580m1   (2m51mWeek 4 1   (2mg7m  (2mg)57mek 5 1   (2mg) 43m(50mg)  82m 6 2   (50mg) 2 68m0mg)  We45m 2   (50mg) 3   64mg)  Week91m   (75mg) 3   (755m  Week 977m (75mg) 4   (1002m Week 10 52m(100mg) 4   (100mg18m-Start ma23mium oxide 500 mg QHS -Next steps: consider gabapentin, verapamil, propranolol    I spent a total of 40 minutes chart reviewing and counseling the patient. Headache education was done. Discussed treatment options including preventive and acute medications, and natural supplements. Discussed medication side effects, adverse reactions and drug interactions. Written educational materials and patient instructions outlining all of the above were given.  Follow-up: 6 months   Analisse Randle, MDGenia Harold0:27 AM

## 2022-11-18 ENCOUNTER — Ambulatory Visit (INDEPENDENT_AMBULATORY_CARE_PROVIDER_SITE_OTHER): Payer: BC Managed Care – PPO | Admitting: Psychiatry

## 2022-11-18 ENCOUNTER — Encounter: Payer: Self-pay | Admitting: Psychiatry

## 2022-11-18 VITALS — BP 139/74 | HR 49 | Ht 67.0 in | Wt 255.0 lb

## 2022-11-18 DIAGNOSIS — H547 Unspecified visual loss: Secondary | ICD-10-CM

## 2022-11-18 DIAGNOSIS — G43509 Persistent migraine aura without cerebral infarction, not intractable, without status migrainosus: Secondary | ICD-10-CM | POA: Diagnosis not present

## 2022-11-18 MED ORDER — LAMOTRIGINE 100 MG PO TABS: 100.0000 mg | ORAL_TABLET | Freq: Two times a day (BID) | ORAL | 5 refills | Status: DC

## 2022-11-18 MED ORDER — LAMOTRIGINE 25 MG PO TABS: ORAL_TABLET | ORAL | 0 refills | Status: DC

## 2022-11-18 NOTE — Patient Instructions (Addendum)
Start Lamotrigine  25 mg pills   Week Morning # of pills (mg) Evening # of pills (mg)  Week 1 0 1   (25mg )  Week 2 0 1   (25mg )  Week 3 1   (25mg ) 1   (25mg )  Week 4 1   (25mg ) 1   (25mg )  Week 5 1   (25mg ) 2   (50mg )  Week 6 2   (50mg ) 2   (50mg )  Week 7 2   (50mg ) 3   (75mg )  Week 8 3   (75mg ) 3   (75mg )  Week 9 3   (75mg ) 4   (100mg )  Week 10 4   (100mg ) 4   (100mg )                    Call the office at Week 10 to obtain the 100mg  tablets Watch for any new skin rash, skin blisters, redness/blistering in lips or around eyes. Call and notify in case of these adverse effects.  You should be seen by a doctor if you notice these changes.  If determined to be medication induced, you may need an alternate medicine.  Can start magnesium oxide 500 mg at bedtime to help prevent migraine aura

## 2022-11-19 ENCOUNTER — Encounter: Payer: Self-pay | Admitting: Surgery

## 2022-11-22 ENCOUNTER — Telehealth: Payer: Self-pay | Admitting: Psychiatry

## 2022-11-22 ENCOUNTER — Ambulatory Visit
Admission: RE | Admit: 2022-11-22 | Discharge: 2022-11-22 | Disposition: A | Discharge status: Home / Self Care | Payer: BC Managed Care – PPO | Source: Ambulatory Visit | Attending: Surgery | Admitting: Surgery

## 2022-11-22 DIAGNOSIS — I712 Thoracic aortic aneurysm, without rupture, unspecified: Secondary | ICD-10-CM | POA: Diagnosis not present

## 2022-11-22 DIAGNOSIS — I2510 Atherosclerotic heart disease of native coronary artery without angina pectoris: Secondary | ICD-10-CM

## 2022-11-22 DIAGNOSIS — I517 Cardiomegaly: Secondary | ICD-10-CM | POA: Diagnosis not present

## 2022-11-22 MED ORDER — IOPAMIDOL (ISOVUE-370) INJECTION 76%
75.0000 mL | Freq: Once | INTRAVENOUS | Status: AC | PRN
Start: 2022-11-22 — End: 2022-11-22
  Administered 2022-11-22: 75 mL via INTRAVENOUS

## 2022-11-22 NOTE — Telephone Encounter (Signed)
Pt scheduled for MR brain w/wo contrast at [facility] for 11/23/22 at 8:00am  [employer] IEPP#[id number] (exp.11/19/22-12/18/22)

## 2022-11-23 ENCOUNTER — Ambulatory Visit: Payer: BC Managed Care – PPO

## 2022-11-23 DIAGNOSIS — H547 Unspecified visual loss: Secondary | ICD-10-CM

## 2022-11-23 MED ORDER — GADOBENATE DIMEGLUMINE 529 MG/ML IV SOLN
20.0000 mL | Freq: Once | INTRAVENOUS | Status: AC | PRN
  Administered 2022-11-23: 20 mL via INTRAVENOUS

## 2022-11-24 ENCOUNTER — Other Ambulatory Visit: Payer: Self-pay | Admitting: *Deleted

## 2022-11-24 ENCOUNTER — Ambulatory Visit (INDEPENDENT_AMBULATORY_CARE_PROVIDER_SITE_OTHER): Payer: BC Managed Care – PPO | Admitting: Surgery

## 2022-11-24 ENCOUNTER — Encounter: Payer: Self-pay | Admitting: Surgery

## 2022-11-24 ENCOUNTER — Telehealth: Payer: Self-pay | Admitting: Cardiovascular Disease

## 2022-11-24 VITALS — BP 163/85 | HR 47 | Resp 18 | Ht 67.0 in | Wt 254.0 lb

## 2022-11-24 DIAGNOSIS — Z01810 Encounter for preprocedural cardiovascular examination: Secondary | ICD-10-CM

## 2022-11-24 DIAGNOSIS — I7121 Aneurysm of the ascending aorta, without rupture: Secondary | ICD-10-CM

## 2022-11-24 DIAGNOSIS — I712 Thoracic aortic aneurysm, without rupture, unspecified: Secondary | ICD-10-CM

## 2022-11-24 DIAGNOSIS — I351 Nonrheumatic aortic (valve) insufficiency: Secondary | ICD-10-CM

## 2022-11-24 NOTE — Progress Notes (Signed)
HPI:  The patient is a 46 year old gentleman has a bicuspid aortic valve with mild to moderate AI and normal LV function and a left ventricular diastolic dimension of 5.0 cm on echocardiogram in October 2022.  He also has a 5.2 cm sinus diameter on gated cardiac CTA from 04/02/2022 which is unchanged and a 4.7 cm ascending aorta on CTA of the chest which is slightly larger than on his prior CTA in October 2022 when it was measured at 4.3 cm. Coronary calcium score was 1592 but FFR was normal in all three coronary vessels in April 2023.  His most recent echo on 10/04/2022 showed a probable bicuspid aortic valve with moderate aortic insufficiency.  The mean gradient across aortic valve was only 4 mmHg.  The aortic root was measured at 4.9 cm with the ascending aorta also measuring 4.9 cm.  Left ventricular ejection fraction was 60 to 65% with grade 2 diastolic dysfunction.  The left ventricular diastolic diameter has increased to 5.5 cm.  He denies any chest pain or shortness of breath.  He denies orthopnea or PND.  He said no peripheral edema.  He saw Dr. Phineas Inches recently.  His main complaint today is related to ocular migraines for which he is seeing Dr. Billey Gosling.  He recently had an MRI of the brain that was unremarkable.  He said that his ocular migraines are brought on by stress.  Current Outpatient Medications  Medication Sig Dispense Refill   atorvastatin (LIPITOR) 40 MG tablet Take 1 tablet (40 mg total) by mouth daily. 90 tablet 3   blood glucose meter kit and supplies Dispense based on patient and insurance preference. Use one time daily. ICD10: E11.65 1 each 0   fenofibrate 54 MG tablet Take 1 tablet (54 mg total) by mouth daily. 90 tablet 1   glipiZIDE (GLUCOTROL XL) 5 MG 24 hr tablet Take 1 tablet (5 mg total) by mouth daily with breakfast. 90 tablet 0   glucose blood (ONETOUCH VERIO) test strip 1 each by Other route in the morning and at bedtime. Use as instructed 100 each 1   [START  ON 01/27/2023] lamoTRIgine (LAMICTAL) 100 MG tablet Take 1 tablet (100 mg total) by mouth 2 (two) times daily. 60 tablet 5   lamoTRIgine (LAMICTAL) 25 MG tablet Take 1 pill at bedtime for 2 weeks. Then take 1 pill twice a day for 2 weeks. Then take 1 pill in AM and 2 pills in PM for one week. Then take 2 pills twice a day for one week. Then take 2 pills in AM and 3 pills in PM for one week. Then take 3 pills twice a day for one week. Then take 3 pills in AM and 4 pills in PM for one week. Then take 4 pills twice a day until pills are gone. Then start 100 mg tablets. 273 tablet 0   NOVOLIN 70/30 KWIKPEN (70-30) 100 UNIT/ML KwikPen SMARTSIG:20 Unit(s) SUB-Q Twice Daily     pioglitazone (ACTOS) 15 MG tablet Take 0.5 tablets (7.5 mg total) by mouth daily. With food 45 tablet 0   VITAMIN D PO Take by mouth.     No current facility-administered medications for this visit.     Physical Exam: BP (!) 163/85 (BP Location: Left Arm, Patient Position: Sitting)   Pulse (!) 47   Resp 18   Ht 5' 7" (1.702 m)   Wt 254 lb (115.2 kg)   SpO2 98% Comment: RA  BMI 39.78  kg/m  He looks well. Cardiac exam shows a regular rate and rhythm with a normal S1 and S2.  There is no murmur. Lungs are clear. There is no peripheral edema.  Diagnostic Tests:  Narrative & Impression  CLINICAL DATA:  A 46 year old male presents for follow-up of aortic aneurysm.   EXAM: CT ANGIOGRAPHY CHEST WITH CONTRAST   TECHNIQUE: Multidetector CT imaging of the chest was performed using the standard protocol during bolus administration of intravenous contrast. Multiplanar CT image reconstructions and MIPs were obtained to evaluate the vascular anatomy.   RADIATION DOSE REDUCTION: This exam was performed according to the departmental dose-optimization program which includes automated exposure control, adjustment of the mA and/or kV according to patient size and/or use of iterative reconstruction technique.   CONTRAST:  19m  ISOVUE-370 IOPAMIDOL (ISOVUE-370) INJECTION 76%   COMPARISON:  April 02, 2022   FINDINGS: Cardiovascular: Heart size is stable with extensive 3 vessel coronary artery disease. No pericardial effusion or sign of pericardial nodularity.   Ascending thoracic aorta/aortic root with dilation of 4.7 cm in the axial plane and approximately 4.5 cm in the coronal plane is similar to most recent imaging from April of 2023. Aortic sinus near 5 cm accounting for motion artifact also measured in coronal plane.   Descending thoracic aorta with normal caliber. No stranding adjacent to the aorta. Aorta displays a bovine arch branching pattern.   Central pulmonary vasculature unremarkable on venous phase.   Mediastinum/Nodes: No thoracic inlet, axillary, mediastinal or hilar adenopathy. Esophagus grossly normal.   Lungs/Pleura: No consolidation. No sign of pleural effusion. Airways are patent.   Upper Abdomen: Incidental imaging of upper abdominal contents is unremarkable. Imaged portions of liver, gallbladder, pancreas, spleen, adrenal glands and kidneys without acute process.   Musculoskeletal: No chest wall abnormality. No acute or significant osseous findings.   Review of the MIP images confirms the above findings.   IMPRESSION: 1. Stable aneurysmal dilation of the ascending thoracic aorta/aortic root. Aortic sinus near 5 cm accounting for motion artifact also measured in coronal plane. Correlate with any signs of concomitant valvular dysfunction. Ascending thoracic aortic aneurysm. Recommend semi-annual imaging followup by CTA or MRA and referral to cardiothoracic surgery if not already obtained. This recommendation follows 2010 ACCF/AHA/AATS/ACR/ASA/SCA/SCAI/SIR/STS/SVM Guidelines for the Diagnosis and Management of Patients With Thoracic Aortic Disease. Circulation. 2010; 121:: G017-C944 Aortic aneurysm NOS (ICD10-I71.9) 2. Coronary artery disease. 3. Cardiomegaly 4. No acute  findings in the chest.     Electronically Signed   By: GZetta BillsM.D.   On: 11/22/2022 10:31      ECHOCARDIOGRAM REPORT       Patient Name:   Douglas COMERDate of Exam: 10/04/2022  Medical Rec #:  [id number]         Height:       67.0 in  Accession #:    [id number]        Weight:       240.0 lb  Date of Birth:  2Feb 17, 1977          BSA:          2.185 m  Patient Age:    89years           BP:           149/80 mmHg  Patient Gender: M                  HR:  55 bpm.  Exam Location:  Outpatient   Procedure: 2D Echo, Cardiac Doppler and Color Doppler   Indications:    Aortic valve disorder I35.9    History:        Patient has prior history of Echocardiogram examinations,  most                 recent 10/08/2021. Risk Factors:Hypertension, Diabetes and                  Dyslipidemia.    Sonographer:    Darlina Sicilian RDCS  Referring Phys: [place]     1. Left ventricular ejection fraction, by estimation, is 60 to 65%. The  left ventricle has normal function. The left ventricle has no regional  wall motion abnormalities. Left ventricular diastolic parameters are  consistent with Grade II diastolic  dysfunction (pseudonormalization). Elevated left ventricular end-diastolic  pressure.   2. Right ventricular systolic function is normal. The right ventricular  size is normal.   3. The mitral valve is normal in structure. No evidence of mitral valve  regurgitation. No evidence of mitral stenosis.   4. Cannot r/o bucuspid valve with fused right and left cusps Overall poor  quality sutdy especially color flow . The aortic valve is abnormal. Aortic  valve regurgitation is moderate. No aortic stenosis is present.   5. Aortic dilatation noted. There is severe dilatation of the aortic  root, measuring 49 mm. There is severe dilatation of the ascending aorta,  measuring 49 mm.   6. The inferior vena cava is normal in size with greater than  50%  respiratory variability, suggesting right atrial pressure of 3 mmHg.   FINDINGS   Left Ventricle: Left ventricular ejection fraction, by estimation, is 60  to 65%. The left ventricle has normal function. The left ventricle has no  regional wall motion abnormalities. The left ventricular internal cavity  size was normal in size. There is   no left ventricular hypertrophy. Left ventricular diastolic parameters  are consistent with Grade II diastolic dysfunction (pseudonormalization).  Elevated left ventricular end-diastolic pressure.   Right Ventricle: The right ventricular size is normal. No increase in  right ventricular wall thickness. Right ventricular systolic function is  normal.   Left Atrium: Left atrial size was normal in size.   Right Atrium: Right atrial size was normal in size.   Pericardium: There is no evidence of pericardial effusion.   Mitral Valve: The mitral valve is normal in structure. No evidence of  mitral valve regurgitation. No evidence of mitral valve stenosis.   Tricuspid Valve: The tricuspid valve is normal in structure. Tricuspid  valve regurgitation is not demonstrated. No evidence of tricuspid  stenosis.   Aortic Valve: Cannot r/o bucuspid valve with fused right and left cusps  Overall poor quality sutdy especially color flow. The aortic valve is  abnormal. Aortic valve regurgitation is moderate. Aortic regurgitation PHT  measures 1041 msec. No aortic  stenosis is present. Aortic valve mean gradient measures 4.0 mmHg. Aortic  valve peak gradient measures 9.1 mmHg. Aortic valve area, by VTI measures  4.33 cm.   Pulmonic Valve: The pulmonic valve was normal in structure. Pulmonic valve  regurgitation is not visualized. No evidence of pulmonic stenosis.   Aorta: Aortic dilatation noted. There is severe dilatation of the aortic  root, measuring 49 mm. There is severe dilatation of the ascending aorta,  measuring 49 mm.   Venous: The inferior  vena cava is  normal in size with greater than 50%  respiratory variability, suggesting right atrial pressure of 3 mmHg.   IAS/Shunts: No atrial level shunt detected by color flow Doppler.     LEFT VENTRICLE  PLAX 2D  LVIDd:         5.50 cm   Diastology  LVIDs:         3.50 cm   LV e' medial:    8.27 cm/s  LV PW:         1.00 cm   LV E/e' medial:  13.2  LV IVS:        1.10 cm   LV e' lateral:   7.29 cm/s  LVOT diam:     2.70 cm   LV E/e' lateral: 15.0  LV SV:         140  LV SV Index:   64  LVOT Area:     5.73 cm     RIGHT VENTRICLE  RV S prime:     13.60 cm/s  TAPSE (M-mode): 1.7 cm   LEFT ATRIUM             Index  LA diam:        3.10 cm 1.42 cm/m  LA Vol (A2C):   38.9 ml 17.80 ml/m  LA Vol (A4C):   41.9 ml 19.17 ml/m  LA Biplane Vol: 40.5 ml 18.53 ml/m   AORTIC VALVE  AV Area (Vmax):    3.56 cm  AV Area (Vmean):   4.38 cm  AV Area (VTI):     4.33 cm  AV Vmax:           151.00 cm/s  AV Vmean:          87.400 cm/s  AV VTI:            0.323 m  AV Peak Grad:      9.1 mmHg  AV Mean Grad:      4.0 mmHg  LVOT Vmax:         94.00 cm/s  LVOT Vmean:        66.900 cm/s  LVOT VTI:          0.244 m  LVOT/AV VTI ratio: 0.76  AI PHT:            1041 msec    AORTA  Ao Root diam: 4.90 cm  Ao STJ diam:  3.7 cm  Ao Asc diam:  4.70 cm   MITRAL VALVE  MV Area (PHT): 3.02 cm     SHUNTS  MV Decel Time: 251 msec     Systemic VTI:  0.24 m  MR PISA:        2.26 cm    Systemic Diam: 2.70 cm  MR PISA Radius: 0.60 cm  MV E velocity: 109.00 cm/s  MV A velocity: 101.00 cm/s  MV E/A ratio:  1.08   Jenkins Rouge MD  Electronically signed by Jenkins Rouge MD  Signature Date/Time: 10/04/2022/11:03:04 AM        Final     Impression:  This 46 year old gentleman has a bicuspid aortic valve with moderate aortic insufficiency and a 5.0 cm aortic root aneurysm with a 4.7 cm ascending aortic aneurysm.  His left ventricular diastolic dimension has gone from 5.0 to 5.5 cm over the  past year.  With a bicuspid aortic valve I think that it is best to proceed with replacement of his aortic valve, aortic root and ascending aortic aneurysm to decrease the risk of  aortic dissection and progressive left ventricular dysfunction.  His aortic root is at the surgical threshold for younger patient with a bicuspid aortic valve and given his tendency to hypertension I think he is best treated surgically in the near future.  I reviewed his echocardiogram and CTA studies with him and answered his questions.  We discussed the pros and cons of mechanical and bioprosthetic valves and I think a mechanical valve would be best that his young age.  He has no contraindication to anticoagulation and feels that he could be compliant with taking Coumadin and maintaining follow-up.  He will require preoperative cardiac catheterization since his previous cardiac CT had shown significant multivessel coronary plaque although his FFR was negative.  That study was done in April of this year. I discussed the operative procedure with the patient including alternatives, benefits and risks; including but not limited to bleeding, blood transfusion, infection, stroke, myocardial infarction, graft failure, heart block requiring a permanent pacemaker, organ dysfunction, and death.  Douglas Phillips understands and agrees to proceed.    Plan:  He is going to think about the timing of surgery and will call us back to schedule Bent[place] procedure using a mechanical valve conduit with replacement of his ascending aortic aneurysm under deep hypothermic circulatory arrest.  I spent 20 minutes performing this established patient evaluation and > 50% of this time was spent face to face counseling and coordinating the care of this patient's bicuspid aortic valve regurgitation and aortic aneurysm.    Gaye Pollack, MD [facility] [phone]

## 2022-11-24 NOTE — Telephone Encounter (Signed)
Per Darius Bump @ [facility]: Hey!! Can you help me get this patient set up for a heart cath w/ [facility]?? 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 He wants it ASAP but Laneta Simmers is booked so I plan to offer him 1/15 if [facility] can cath him at least 1 week prior to surgery   Informed Bartle's office that [facility] is not in the lab prior to their desired date of 1/15. Ryan/[facility] are both okay with either [facility] or Lynnette Caffey doing it. Cath has been scheduled for 12/17/22 @7 :30 with [facility]. Pt had H & P done today with [facility]. Appt scheduled for 12/02/22 for nurse visit to update EKG and get labs done. Spoke with patient and reviewed all above & cath instructions with him. Sending via MyChart as well.

## 2022-11-24 NOTE — H&P (View-Only) (Signed)
  HPI:  The patient is a 46 year old gentleman has a bicuspid aortic valve with mild to moderate AI and normal LV function and a left ventricular diastolic dimension of 5.0 cm on echocardiogram in October 2022.  He also has a 5.2 cm sinus diameter on gated cardiac CTA from 04/02/2022 which is unchanged and a 4.7 cm ascending aorta on CTA of the chest which is slightly larger than on his prior CTA in October 2022 when it was measured at 4.3 cm. Coronary calcium score was 1592 but FFR was normal in all three coronary vessels in April 2023.  His most recent echo on 10/04/2022 showed a probable bicuspid aortic valve with moderate aortic insufficiency.  The mean gradient across aortic valve was only 4 mmHg.  The aortic root was measured at 4.9 cm with the ascending aorta also measuring 4.9 cm.  Left ventricular ejection fraction was 60 to 65% with grade 2 diastolic dysfunction.  The left ventricular diastolic diameter has increased to 5.5 cm.  He denies any chest pain or shortness of breath.  He denies orthopnea or PND.  He said no peripheral edema.  He saw Dr. Mary Branch recently.  His main complaint today is related to ocular migraines for which he is seeing Dr. Chima.  He recently had an MRI of the brain that was unremarkable.  He said that his ocular migraines are brought on by stress.  Current Outpatient Medications  Medication Sig Dispense Refill   atorvastatin (LIPITOR) 40 MG tablet Take 1 tablet (40 mg total) by mouth daily. 90 tablet 3   blood glucose meter kit and supplies Dispense based on patient and insurance preference. Use one time daily. ICD10: E11.65 1 each 0   fenofibrate 54 MG tablet Take 1 tablet (54 mg total) by mouth daily. 90 tablet 1   glipiZIDE (GLUCOTROL XL) 5 MG 24 hr tablet Take 1 tablet (5 mg total) by mouth daily with breakfast. 90 tablet 0   glucose blood (ONETOUCH VERIO) test strip 1 each by Other route in the morning and at bedtime. Use as instructed 100 each 1   [START  ON 01/27/2023] lamoTRIgine (LAMICTAL) 100 MG tablet Take 1 tablet (100 mg total) by mouth 2 (two) times daily. 60 tablet 5   lamoTRIgine (LAMICTAL) 25 MG tablet Take 1 pill at bedtime for 2 weeks. Then take 1 pill twice a day for 2 weeks. Then take 1 pill in AM and 2 pills in PM for one week. Then take 2 pills twice a day for one week. Then take 2 pills in AM and 3 pills in PM for one week. Then take 3 pills twice a day for one week. Then take 3 pills in AM and 4 pills in PM for one week. Then take 4 pills twice a day until pills are gone. Then start 100 mg tablets. 273 tablet 0   NOVOLIN 70/30 KWIKPEN (70-30) 100 UNIT/ML KwikPen SMARTSIG:20 Unit(s) SUB-Q Twice Daily     pioglitazone (ACTOS) 15 MG tablet Take 0.5 tablets (7.5 mg total) by mouth daily. With food 45 tablet 0   VITAMIN D PO Take by mouth.     No current facility-administered medications for this visit.     Physical Exam: BP (!) 163/85 (BP Location: Left Arm, Patient Position: Sitting)   Pulse (!) 47   Resp 18   Ht 5' 7" (1.702 m)   Wt 254 lb (115.2 kg)   SpO2 98% Comment: RA  BMI 39.78   kg/m  He looks well. Cardiac exam shows a regular rate and rhythm with a normal S1 and S2.  There is no murmur. Lungs are clear. There is no peripheral edema.  Diagnostic Tests:  Narrative & Impression  CLINICAL DATA:  A 46-year-old male presents for follow-up of aortic aneurysm.   EXAM: CT ANGIOGRAPHY CHEST WITH CONTRAST   TECHNIQUE: Multidetector CT imaging of the chest was performed using the standard protocol during bolus administration of intravenous contrast. Multiplanar CT image reconstructions and MIPs were obtained to evaluate the vascular anatomy.   RADIATION DOSE REDUCTION: This exam was performed according to the departmental dose-optimization program which includes automated exposure control, adjustment of the mA and/or kV according to patient size and/or use of iterative reconstruction technique.   CONTRAST:  75mL  ISOVUE-370 IOPAMIDOL (ISOVUE-370) INJECTION 76%   COMPARISON:  April 02, 2022   FINDINGS: Cardiovascular: Heart size is stable with extensive 3 vessel coronary artery disease. No pericardial effusion or sign of pericardial nodularity.   Ascending thoracic aorta/aortic root with dilation of 4.7 cm in the axial plane and approximately 4.5 cm in the coronal plane is similar to most recent imaging from April of 2023. Aortic sinus near 5 cm accounting for motion artifact also measured in coronal plane.   Descending thoracic aorta with normal caliber. No stranding adjacent to the aorta. Aorta displays a bovine arch branching pattern.   Central pulmonary vasculature unremarkable on venous phase.   Mediastinum/Nodes: No thoracic inlet, axillary, mediastinal or hilar adenopathy. Esophagus grossly normal.   Lungs/Pleura: No consolidation. No sign of pleural effusion. Airways are patent.   Upper Abdomen: Incidental imaging of upper abdominal contents is unremarkable. Imaged portions of liver, gallbladder, pancreas, spleen, adrenal glands and kidneys without acute process.   Musculoskeletal: No chest wall abnormality. No acute or significant osseous findings.   Review of the MIP images confirms the above findings.   IMPRESSION: 1. Stable aneurysmal dilation of the ascending thoracic aorta/aortic root. Aortic sinus near 5 cm accounting for motion artifact also measured in coronal plane. Correlate with any signs of concomitant valvular dysfunction. Ascending thoracic aortic aneurysm. Recommend semi-annual imaging followup by CTA or MRA and referral to cardiothoracic surgery if not already obtained. This recommendation follows 2010 ACCF/AHA/AATS/ACR/ASA/SCA/SCAI/SIR/STS/SVM Guidelines for the Diagnosis and Management of Patients With Thoracic Aortic Disease. Circulation. 2010; 121: E266-e369. Aortic aneurysm NOS (ICD10-I71.9) 2. Coronary artery disease. 3. Cardiomegaly 4. No acute  findings in the chest.     Electronically Signed   By: Geoffrey  Wile M.D.   On: 11/22/2022 10:31      ECHOCARDIOGRAM REPORT       Patient Name:   Douglas Phillips Date of Exam: 10/04/2022  Medical Rec #:  [id number]          Height:       67.0 in  Accession #:    [id number]         Weight:       240.0 lb  Date of Birth:  10/16/1976           BSA:          2.185 m  Patient Age:    46 years           BP:           149/80 mmHg  Patient Gender: M                  HR:             55 bpm.  Exam Location:  Outpatient   Procedure: 2D Echo, Cardiac Doppler and Color Doppler   Indications:    Aortic valve disorder I35.9    History:        Patient has prior history of Echocardiogram examinations,  most                 recent 10/08/2021. Risk Factors:Hypertension, Diabetes and                  Dyslipidemia.    Sonographer:    Tiffany Cooper RDCS  Referring Phys: [id number] Halley   IMPRESSIONS     1. Left ventricular ejection fraction, by estimation, is 60 to 65%. The  left ventricle has normal function. The left ventricle has no regional  wall motion abnormalities. Left ventricular diastolic parameters are  consistent with Grade II diastolic  dysfunction (pseudonormalization). Elevated left ventricular end-diastolic  pressure.   2. Right ventricular systolic function is normal. The right ventricular  size is normal.   3. The mitral valve is normal in structure. No evidence of mitral valve  regurgitation. No evidence of mitral stenosis.   4. Cannot r/o bucuspid valve with fused right and left cusps Overall poor  quality sutdy especially color flow . The aortic valve is abnormal. Aortic  valve regurgitation is moderate. No aortic stenosis is present.   5. Aortic dilatation noted. There is severe dilatation of the aortic  root, measuring 49 mm. There is severe dilatation of the ascending aorta,  measuring 49 mm.   6. The inferior vena cava is normal in size with greater than  50%  respiratory variability, suggesting right atrial pressure of 3 mmHg.   FINDINGS   Left Ventricle: Left ventricular ejection fraction, by estimation, is 60  to 65%. The left ventricle has normal function. The left ventricle has no  regional wall motion abnormalities. The left ventricular internal cavity  size was normal in size. There is   no left ventricular hypertrophy. Left ventricular diastolic parameters  are consistent with Grade II diastolic dysfunction (pseudonormalization).  Elevated left ventricular end-diastolic pressure.   Right Ventricle: The right ventricular size is normal. No increase in  right ventricular wall thickness. Right ventricular systolic function is  normal.   Left Atrium: Left atrial size was normal in size.   Right Atrium: Right atrial size was normal in size.   Pericardium: There is no evidence of pericardial effusion.   Mitral Valve: The mitral valve is normal in structure. No evidence of  mitral valve regurgitation. No evidence of mitral valve stenosis.   Tricuspid Valve: The tricuspid valve is normal in structure. Tricuspid  valve regurgitation is not demonstrated. No evidence of tricuspid  stenosis.   Aortic Valve: Cannot r/o bucuspid valve with fused right and left cusps  Overall poor quality sutdy especially color flow. The aortic valve is  abnormal. Aortic valve regurgitation is moderate. Aortic regurgitation PHT  measures 1041 msec. No aortic  stenosis is present. Aortic valve mean gradient measures 4.0 mmHg. Aortic  valve peak gradient measures 9.1 mmHg. Aortic valve area, by VTI measures  4.33 cm.   Pulmonic Valve: The pulmonic valve was normal in structure. Pulmonic valve  regurgitation is not visualized. No evidence of pulmonic stenosis.   Aorta: Aortic dilatation noted. There is severe dilatation of the aortic  root, measuring 49 mm. There is severe dilatation of the ascending aorta,  measuring 49 mm.   Venous: The inferior  vena cava is   normal in size with greater than 50%  respiratory variability, suggesting right atrial pressure of 3 mmHg.   IAS/Shunts: No atrial level shunt detected by color flow Doppler.     LEFT VENTRICLE  PLAX 2D  LVIDd:         5.50 cm   Diastology  LVIDs:         3.50 cm   LV e' medial:    8.27 cm/s  LV PW:         1.00 cm   LV E/e' medial:  13.2  LV IVS:        1.10 cm   LV e' lateral:   7.29 cm/s  LVOT diam:     2.70 cm   LV E/e' lateral: 15.0  LV SV:         140  LV SV Index:   64  LVOT Area:     5.73 cm     RIGHT VENTRICLE  RV S prime:     13.60 cm/s  TAPSE (M-mode): 1.7 cm   LEFT ATRIUM             Index  LA diam:        3.10 cm 1.42 cm/m  LA Vol (A2C):   38.9 ml 17.80 ml/m  LA Vol (A4C):   41.9 ml 19.17 ml/m  LA Biplane Vol: 40.5 ml 18.53 ml/m   AORTIC VALVE  AV Area (Vmax):    3.56 cm  AV Area (Vmean):   4.38 cm  AV Area (VTI):     4.33 cm  AV Vmax:           151.00 cm/s  AV Vmean:          87.400 cm/s  AV VTI:            0.323 m  AV Peak Grad:      9.1 mmHg  AV Mean Grad:      4.0 mmHg  LVOT Vmax:         94.00 cm/s  LVOT Vmean:        66.900 cm/s  LVOT VTI:          0.244 m  LVOT/AV VTI ratio: 0.76  AI PHT:            1041 msec    AORTA  Ao Root diam: 4.90 cm  Ao STJ diam:  3.7 cm  Ao Asc diam:  4.70 cm   MITRAL VALVE  MV Area (PHT): 3.02 cm     SHUNTS  MV Decel Time: 251 msec     Systemic VTI:  0.24 m  MR PISA:        2.26 cm    Systemic Diam: 2.70 cm  MR PISA Radius: 0.60 cm  MV E velocity: 109.00 cm/s  MV A velocity: 101.00 cm/s  MV E/A ratio:  1.08   Peter Nishan MD  Electronically signed by Peter Nishan MD  Signature Date/Time: 10/04/2022/11:03:04 AM        Final     Impression:  This 46-year-old gentleman has a bicuspid aortic valve with moderate aortic insufficiency and a 5.0 cm aortic root aneurysm with a 4.7 cm ascending aortic aneurysm.  His left ventricular diastolic dimension has gone from 5.0 to 5.5 cm over the  past year.  With a bicuspid aortic valve I think that it is best to proceed with replacement of his aortic valve, aortic root and ascending aortic aneurysm to decrease the risk of   aortic dissection and progressive left ventricular dysfunction.  His aortic root is at the surgical threshold for younger patient with a bicuspid aortic valve and given his tendency to hypertension I think he is best treated surgically in the near future.  I reviewed his echocardiogram and CTA studies with him and answered his questions.  We discussed the pros and cons of mechanical and bioprosthetic valves and I think a mechanical valve would be best that his young age.  He has no contraindication to anticoagulation and feels that he could be compliant with taking Coumadin and maintaining follow-up.  He will require preoperative cardiac catheterization since his previous cardiac CT had shown significant multivessel coronary plaque although his FFR was negative.  That study was done in April of this year. I discussed the operative procedure with the patient including alternatives, benefits and risks; including but not limited to bleeding, blood transfusion, infection, stroke, myocardial infarction, graft failure, heart block requiring a permanent pacemaker, organ dysfunction, and death.  Douglas Phillips understands and agrees to proceed.    Plan:  He is going to think about the timing of surgery and will call us back to schedule Bentall procedure using a mechanical valve conduit with replacement of his ascending aortic aneurysm under deep hypothermic circulatory arrest.  I spent 20 minutes performing this established patient evaluation and > 50% of this time was spent face to face counseling and coordinating the care of this patient's bicuspid aortic valve regurgitation and aortic aneurysm.    Chantal Worthey K Catherina Pates, MD [facility] [phone]       

## 2022-11-25 ENCOUNTER — Encounter: Payer: Self-pay | Admitting: *Deleted

## 2022-11-25 ENCOUNTER — Other Ambulatory Visit: Payer: Self-pay | Admitting: *Deleted

## 2022-11-25 DIAGNOSIS — I351 Nonrheumatic aortic (valve) insufficiency: Secondary | ICD-10-CM

## 2022-11-25 DIAGNOSIS — I7121 Aneurysm of the ascending aorta, without rupture: Secondary | ICD-10-CM

## 2022-11-26 DIAGNOSIS — K8681 Exocrine pancreatic insufficiency: Secondary | ICD-10-CM | POA: Diagnosis not present

## 2022-11-26 DIAGNOSIS — Z1211 Encounter for screening for malignant neoplasm of colon: Secondary | ICD-10-CM | POA: Diagnosis not present

## 2022-11-26 DIAGNOSIS — K529 Noninfective gastroenteritis and colitis, unspecified: Secondary | ICD-10-CM | POA: Diagnosis not present

## 2022-11-26 DIAGNOSIS — K648 Other hemorrhoids: Secondary | ICD-10-CM | POA: Diagnosis not present

## 2022-11-28 ENCOUNTER — Encounter: Payer: Self-pay | Admitting: Surgery

## 2022-12-01 DIAGNOSIS — Z23 Encounter for immunization: Secondary | ICD-10-CM | POA: Diagnosis not present

## 2022-12-01 DIAGNOSIS — E559 Vitamin D deficiency, unspecified: Secondary | ICD-10-CM | POA: Diagnosis not present

## 2022-12-01 DIAGNOSIS — E7800 Pure hypercholesterolemia, unspecified: Secondary | ICD-10-CM | POA: Diagnosis not present

## 2022-12-01 DIAGNOSIS — Z79899 Other long term (current) drug therapy: Secondary | ICD-10-CM | POA: Diagnosis not present

## 2022-12-01 DIAGNOSIS — E119 Type 2 diabetes mellitus without complications: Secondary | ICD-10-CM | POA: Diagnosis not present

## 2022-12-02 ENCOUNTER — Ambulatory Visit: Payer: BC Managed Care – PPO | Attending: Cardiovascular Disease | Admitting: Cardiovascular Disease

## 2022-12-02 ENCOUNTER — Ambulatory Visit: Payer: BC Managed Care – PPO

## 2022-12-02 VITALS — BP 128/70 | HR 53 | Resp 20 | Wt 249.0 lb

## 2022-12-02 DIAGNOSIS — I712 Thoracic aortic aneurysm, without rupture, unspecified: Secondary | ICD-10-CM | POA: Diagnosis not present

## 2022-12-02 DIAGNOSIS — Z01810 Encounter for preprocedural cardiovascular examination: Secondary | ICD-10-CM

## 2022-12-02 NOTE — Patient Instructions (Signed)
   Nurse Visit   Date of Encounter: 12/02/2022 ID: Douglas Phillips, DOB 1976/10/15, MRN [id number]  PCP:  Karle Plumber, MD   [facility] Providers Cardiologist:  Maisie Fus, MD      Visit Details   VS:  BP 128/70 (BP Location: Right Arm)   Pulse (!) 53   Resp 20   Wt 249 lb 0.2 oz (113 kg)   SpO2 99%   BMI 39.00 kg/m  , BMI Body mass index is 39 kg/m.  Wt Readings from Last 3 Encounters:  12/02/22 249 lb 0.2 oz (113 kg)  11/24/22 254 lb (115.2 kg)  11/18/22 255 lb (115.7 kg)     Reason for visit: RN Visit / EKG / Labs CBC, BMET for RT / LT Heart Cath Assessment. Performed today:  Labs, Vitals, EKG, Provider consulted: DOD, Dr. Burna Forts, and Education Changes (medications, testing, etc.) : None Length of Visit: 30 minutes    Medications Adjustments/Labs and Tests Ordered:  Pt arrived at [facility] on 12/02/2022 at 1100 am for a CBC, and BMET lab draw, and EKG for his RT / LT Heart Catheterization scheduled on Friday, January 5th, 2024 with Dr. Carmina Miller.  Pt was mildly bradycardic at time of vital signs assessment, but providers aware of this.  Pt is not taking any Beta blockers.  Pt had labs drawn prior to his vital sign / EKG assessment.  The EKG reflected sinus bradycardia with 1st degree AV block.  QT/Qtc was 430/392, QRS duration 100 ms, PR interval 212 ms, and HR of 50 bpm.   EKG taken to Dr. Eden Emms DOD for assessment;  Dr. Eden Emms told Pt was not on a beta blocker, was asymptomatic, and had already received pre-Heart Catheterization instructions.  No new orders obtained.  I reviewed Pre-Cath instructions again with patient / as seen in MyChart message.  Pt knows arrival time for Heart Cath is 530 am on 12/17/2022.      Signed, Elmore Guise, RN  12/02/2022 11:38 AM

## 2022-12-03 LAB — BASIC METABOLIC PANEL
BUN/Creatinine Ratio: 22 — ABNORMAL HIGH (ref 9–20)
BUN: 21 mg/dL (ref 6–24)
CO2: 25 mmol/L (ref 20–29)
Calcium: 9.3 mg/dL (ref 8.7–10.2)
Chloride: 102 mmol/L (ref 96–106)
Creatinine, Ser: 0.96 mg/dL (ref 0.76–1.27)
Glucose: 164 mg/dL — ABNORMAL HIGH (ref 70–99)
Potassium: 4.5 mmol/L (ref 3.5–5.2)
Sodium: 140 mmol/L (ref 134–144)
eGFR: 100 mL/min/{1.73_m2} (ref >59)

## 2022-12-03 LAB — CBC
Hematocrit: 36.5 % — ABNORMAL LOW (ref 37.5–51.0)
Hemoglobin: 12.6 g/dL — ABNORMAL LOW (ref 13.0–17.7)
MCH: 31.1 pg (ref 26.6–33.0)
MCHC: 34.5 g/dL (ref 31.5–35.7)
MCV: 90 fL (ref 79–97)
Platelets: 227 10*3/uL (ref 150–450)
RBC: 4.05 x10E6/uL — ABNORMAL LOW (ref 4.14–5.80)
RDW: 12.3 % (ref 11.6–15.4)
WBC: 6.4 10*3/uL (ref 3.4–10.8)

## 2022-12-17 ENCOUNTER — Ambulatory Visit (HOSPITAL_COMMUNITY)
Admission: RE | Admit: 2022-12-17 | Discharge: 2022-12-17 | Disposition: A | Discharge status: Home / Self Care | Payer: BC Managed Care – PPO | Attending: Internal Medicine | Admitting: Internal Medicine

## 2022-12-17 ENCOUNTER — Encounter (HOSPITAL_COMMUNITY)
Admission: RE | Disposition: A | Discharge status: Home / Self Care | Payer: Self-pay | Source: Home / Self Care | Attending: Internal Medicine

## 2022-12-17 ENCOUNTER — Other Ambulatory Visit: Payer: Self-pay

## 2022-12-17 ENCOUNTER — Encounter (HOSPITAL_COMMUNITY): Payer: Self-pay | Admitting: Internal Medicine

## 2022-12-17 DIAGNOSIS — I10 Essential (primary) hypertension: Secondary | ICD-10-CM | POA: Insufficient documentation

## 2022-12-17 DIAGNOSIS — I2510 Atherosclerotic heart disease of native coronary artery without angina pectoris: Secondary | ICD-10-CM | POA: Diagnosis not present

## 2022-12-17 DIAGNOSIS — Q231 Congenital insufficiency of aortic valve: Secondary | ICD-10-CM | POA: Diagnosis not present

## 2022-12-17 DIAGNOSIS — I7121 Aneurysm of the ascending aorta, without rupture: Secondary | ICD-10-CM | POA: Diagnosis not present

## 2022-12-17 HISTORY — PX: RIGHT/LEFT HEART CATH AND CORONARY ANGIOGRAPHY: CATH118266

## 2022-12-17 LAB — POCT I-STAT 7, (LYTES, BLD GAS, ICA,H+H)
Acid-base deficit: 2 mmol/L (ref 0.0–2.0)
Bicarbonate: 23.1 mmol/L (ref 20.0–28.0)
Calcium, Ion: 1.2 mmol/L (ref 1.15–1.40)
HCT: 34 % — ABNORMAL LOW (ref 39.0–52.0)
Hemoglobin: 11.6 g/dL — ABNORMAL LOW (ref 13.0–17.0)
O2 Saturation: 95 %
Potassium: 3.8 mmol/L (ref 3.5–5.1)
Sodium: 142 mmol/L (ref 135–145)
TCO2: 24 mmol/L (ref 22–32)
pCO2 arterial: 40.5 mmHg (ref 32–48)
pH, Arterial: 7.364 (ref 7.35–7.45)
pO2, Arterial: 79 mmHg — ABNORMAL LOW (ref 83–108)

## 2022-12-17 LAB — POCT I-STAT EG7
Acid-base deficit: 2 mmol/L (ref 0.0–2.0)
Acid-base deficit: 2 mmol/L (ref 0.0–2.0)
Bicarbonate: 24.1 mmol/L (ref 20.0–28.0)
Bicarbonate: 24.2 mmol/L (ref 20.0–28.0)
Calcium, Ion: 1.24 mmol/L (ref 1.15–1.40)
Calcium, Ion: 1.25 mmol/L (ref 1.15–1.40)
HCT: 35 % — ABNORMAL LOW (ref 39.0–52.0)
HCT: 35 % — ABNORMAL LOW (ref 39.0–52.0)
Hemoglobin: 11.9 g/dL — ABNORMAL LOW (ref 13.0–17.0)
Hemoglobin: 11.9 g/dL — ABNORMAL LOW (ref 13.0–17.0)
O2 Saturation: 74 %
O2 Saturation: 82 %
Potassium: 3.8 mmol/L (ref 3.5–5.1)
Potassium: 3.9 mmol/L (ref 3.5–5.1)
Sodium: 142 mmol/L (ref 135–145)
Sodium: 140 mmol/L (ref 135–145)
TCO2: 25 mmol/L (ref 22–32)
TCO2: 26 mmol/L (ref 22–32)
pCO2, Ven: 45 mmHg (ref 44–60)
pCO2, Ven: 45.6 mmHg (ref 44–60)
pH, Ven: 7.336 (ref 7.25–7.43)
pH, Ven: 7.334 (ref 7.25–7.43)
pO2, Ven: 42 mmHg (ref 32–45)
pO2, Ven: 49 mmHg — ABNORMAL HIGH (ref 32–45)

## 2022-12-17 LAB — GLUCOSE, CAPILLARY
Glucose-Capillary: 133 mg/dL — ABNORMAL HIGH (ref 70–99)
Glucose-Capillary: 128 mg/dL — ABNORMAL HIGH (ref 70–99)

## 2022-12-17 SURGERY — RIGHT/LEFT HEART CATH AND CORONARY ANGIOGRAPHY
Anesthesia: LOCAL

## 2022-12-17 MED ORDER — ASPIRIN 81 MG PO CHEW
81.0000 mg | CHEWABLE_TABLET | ORAL | Status: AC
  Administered 2022-12-17: 81 mg via ORAL
  Filled 2022-12-17: qty 1

## 2022-12-17 MED ORDER — FENTANYL CITRATE (PF) 100 MCG/2ML IJ SOLN
INTRAMUSCULAR | Status: AC
  Filled 2022-12-17: qty 2

## 2022-12-17 MED ORDER — HEPARIN SODIUM (PORCINE) 1000 UNIT/ML IJ SOLN
INTRAMUSCULAR | Status: AC
  Filled 2022-12-17: qty 10

## 2022-12-17 MED ORDER — HEPARIN SODIUM (PORCINE) 1000 UNIT/ML IJ SOLN
INTRAMUSCULAR | Status: DC | PRN
  Administered 2022-12-17: 5000 [IU] via INTRA_ARTERIAL

## 2022-12-17 MED ORDER — SODIUM CHLORIDE 0.9% FLUSH: 3.0000 mL | Freq: Two times a day (BID) | INTRAVENOUS | Status: DC

## 2022-12-17 MED ORDER — LIDOCAINE HCL (PF) 1 % IJ SOLN
INTRAMUSCULAR | Status: AC
  Filled 2022-12-17: qty 30

## 2022-12-17 MED ORDER — VERAPAMIL HCL 2.5 MG/ML IV SOLN
INTRAVENOUS | Status: DC | PRN
  Administered 2022-12-17: 5 mg via INTRA_ARTERIAL

## 2022-12-17 MED ORDER — MIDAZOLAM HCL 2 MG/2ML IJ SOLN
INTRAMUSCULAR | Status: AC
  Filled 2022-12-17: qty 2

## 2022-12-17 MED ORDER — HEPARIN (PORCINE) IN NACL 1000-0.9 UT/500ML-% IV SOLN
INTRAVENOUS | Status: AC
  Filled 2022-12-17: qty 1000

## 2022-12-17 MED ORDER — SODIUM CHLORIDE 0.9% FLUSH: 3.0000 mL | INTRAVENOUS | Status: DC | PRN

## 2022-12-17 MED ORDER — IOHEXOL 350 MG/ML SOLN
INTRAVENOUS | Status: DC | PRN
  Administered 2022-12-17: 130 mL via INTRA_ARTERIAL

## 2022-12-17 MED ORDER — LIDOCAINE HCL (PF) 1 % IJ SOLN
INTRAMUSCULAR | Status: DC | PRN
  Administered 2022-12-17: 2 mL
  Administered 2022-12-17: 10 mL

## 2022-12-17 MED ORDER — SODIUM CHLORIDE 0.9 % WEIGHT BASED INFUSION: 1.0000 mL/kg/h | INTRAVENOUS | Status: DC

## 2022-12-17 MED ORDER — HEPARIN (PORCINE) IN NACL 1000-0.9 UT/500ML-% IV SOLN
INTRAVENOUS | Status: DC | PRN
  Administered 2022-12-17 (×2): 500 mL

## 2022-12-17 MED ORDER — MIDAZOLAM HCL 2 MG/2ML IJ SOLN
INTRAMUSCULAR | Status: DC | PRN
  Administered 2022-12-17 (×2): 1 mg via INTRAVENOUS

## 2022-12-17 MED ORDER — SODIUM CHLORIDE 0.9 % IV SOLN: 250.0000 mL | INTRAVENOUS | Status: DC | PRN

## 2022-12-17 MED ORDER — VERAPAMIL HCL 2.5 MG/ML IV SOLN
INTRAVENOUS | Status: AC
  Filled 2022-12-17: qty 2

## 2022-12-17 MED ORDER — FENTANYL CITRATE (PF) 100 MCG/2ML IJ SOLN
INTRAMUSCULAR | Status: DC | PRN
  Administered 2022-12-17 (×2): 25 ug via INTRAVENOUS

## 2022-12-17 MED ORDER — SODIUM CHLORIDE 0.9 % WEIGHT BASED INFUSION
3.0000 mL/kg/h | INTRAVENOUS | Status: AC
  Administered 2022-12-17: 3 mL/kg/h via INTRAVENOUS

## 2022-12-17 SURGICAL SUPPLY — 17 items
CATH DIAG 6FR JL4 (CATHETERS) IMPLANT
CATH DIAG 6FR JR4 (CATHETERS) IMPLANT
CATH INFINITI 5 FR AR2 MOD (CATHETERS) IMPLANT
CATH INFINITI 5 FR JR5 (CATHETERS) IMPLANT
CATH INFINITI 5FR AL1 (CATHETERS) IMPLANT
CATH INFINITI 6F FL5 (CATHETERS) IMPLANT
CATH SUPER TORQUE PLUS 6F MPA1 (CATHETERS) IMPLANT
CATH SWAN GANZ 7F STRAIGHT (CATHETERS) IMPLANT
DEVICE RAD COMP TR BAND LRG (VASCULAR PRODUCTS) IMPLANT
GLIDESHEATH SLEND SS 6F .021 (SHEATH) IMPLANT
KIT HEART LEFT (KITS) ×1 IMPLANT
PACK CARDIAC CATHETERIZATION (CUSTOM PROCEDURE TRAY) ×1 IMPLANT
SHEATH PINNACLE 7F 10CM (SHEATH) IMPLANT
TRANSDUCER W/STOPCOCK (MISCELLANEOUS) ×1 IMPLANT
TUBING CIL FLEX 10 FLL-RA (TUBING) ×1 IMPLANT
WIRE EMERALD 3MM-J .035X260CM (WIRE) IMPLANT
WIRE MICRO SET SILHO 5FR 7 (SHEATH) IMPLANT

## 2022-12-17 NOTE — Interval H&P Note (Signed)
History and Physical Interval Note:  12/17/2022 6:39 AM  Douglas Phillips  has presented today for surgery, with the diagnosis of CAD.  The various methods of treatment have been discussed with the patient and family. After consideration of risks, benefits and other options for treatment, the patient has consented to  Procedure(s): RIGHT/LEFT HEART CATH AND CORONARY ANGIOGRAPHY (N/A) as a surgical intervention.  The patient's history has been reviewed, patient examined, no change in status, stable for surgery.  I have reviewed the patient's chart and labs.  Questions were answered to the patient's satisfaction.     Early Osmond

## 2022-12-21 ENCOUNTER — Encounter: Payer: Self-pay | Admitting: Surgery

## 2022-12-22 NOTE — Progress Notes (Signed)
Surgical Instructions    Your procedure is scheduled on Monday December 27, 2022.   Report to [facility] Main Entrance "A" at 5:30 A.M., then check in with the Admitting office.  Call this number if you have problems the morning of surgery:  [phone]   If you have any questions prior to your surgery date call [phone]: Open Monday-Friday 8am-4pm If you experience any cold or flu symptoms such as cough, fever, chills, shortness of breath, etc. between now and your scheduled surgery, please notify us at the above number     Remember:  Do not eat or drink after midnight the night before your surgery    Take these medicines the morning of surgery with A SIP OF WATER:   fenofibrate    As of today, STOP taking any Aspirin (unless otherwise instructed by your surgeon) Aleve, Naproxen, Ibuprofen, Motrin, Advil, Goody's, BC's, all herbal medications, fish oil, and all vitamins.  WHAT DO I DO ABOUT MY DIABETES MEDICATION?   Do not take oral diabetes medicines (pills) the morning of surgery.  DO NOT TAKE glipiZIDE THE MORNING OF SURGERY. DO NOT TAKE pioglitazone (ACTOS)  THE MORNING OF SURGERY.  THE NIGHT BEFORE SURGERY, take 70 percent of your normal dose of NOVOLIN 70/30 KWIKPEN. Which will be 12 units.  THE MORNING OF SURGERY, DO NOT TAKE ANY INSULIN.  HOW TO MANAGE YOUR DIABETES BEFORE AND AFTER SURGERY  Why is it important to control my blood sugar before and after surgery? Improving blood sugar levels before and after surgery helps healing and can limit problems. A way of improving blood sugar control is eating a healthy diet by:  Eating less sugar and carbohydrates  Increasing activity/exercise  Talking with your doctor about reaching your blood sugar goals High blood sugars (greater than 180 mg/dL) can raise your risk of infections and slow your recovery, so you will need to focus on controlling your diabetes during the weeks before surgery. Make sure that the doctor  who takes care of your diabetes knows about your planned surgery including the date and location.  How do I manage my blood sugar before surgery? Check your blood sugar at least 4 times a day, starting 2 days before surgery, to make sure that the level is not too high or low.  Check your blood sugar the morning of your surgery when you wake up and every 2 hours until you get to the Short Stay unit.  If your blood sugar is less than 70 mg/dL, you will need to treat for low blood sugar: Do not take insulin. Treat a low blood sugar (less than 70 mg/dL) with  cup of clear juice (cranberry or apple), 4 glucose tablets, OR glucose gel. Recheck blood sugar in 15 minutes after treatment (to make sure it is greater than 70 mg/dL). If your blood sugar is not greater than 70 mg/dL on recheck, call [phone] for further instructions. Report your blood sugar to the short stay nurse when you get to Short Stay.  If you are admitted to the hospital after surgery: Your blood sugar will be checked by the staff and you will probably be given insulin after surgery (instead of oral diabetes medicines) to make sure you have good blood sugar levels. The goal for blood sugar control after surgery is 80-180 mg/dL.            Do not wear jewelry or makeup. Do not wear lotions, powders, perfumes/cologne or deodorant. Do not shave 48 hours  prior to surgery.  Men may shave face and neck. Do not bring valuables to the hospital. Do not wear nail polish, gel polish, artificial nails, or any other type of covering on natural nails (fingers and toes) If you have artificial nails or gel coating that need to be removed by a nail salon, please have this removed prior to surgery. Artificial nails or gel coating may interfere with anesthesia's ability to adequately monitor your vital signs.  [facility] is not responsible for any belongings or valuables.    Do NOT Smoke (Tobacco/Vaping)  24 hours prior to your  procedure  If you use a CPAP at night, you may bring your mask for your overnight stay.   Contacts, glasses, hearing aids, dentures or partials may not be worn into surgery, please bring cases for these belongings   For patients admitted to the hospital, discharge time will be determined by your treatment team.   Patients discharged the day of surgery will not be allowed to drive home, and someone needs to stay with them for 24 hours.   SURGICAL WAITING ROOM VISITATION Patients having surgery or a procedure may have no more than 2 support people in the waiting area - these visitors may rotate.   Children under the age of 32 must have an adult with them who is not the patient. If the patient needs to stay at the hospital during part of their recovery, the visitor guidelines for inpatient rooms apply. Pre-op nurse will coordinate an appropriate time for 1 support person to accompany patient in pre-op.  This support person may not rotate.   Please refer to [email or web address] for the visitor guidelines for Inpatients (after your surgery is over and you are in a regular room).    Special instructions:    Oral Hygiene is also important to reduce your risk of infection.  Remember - BRUSH YOUR TEETH THE MORNING OF SURGERY WITH YOUR REGULAR TOOTHPASTE   [place]- Preparing For Surgery  Before surgery, you can play an important role. Because skin is not sterile, your skin needs to be as free of germs as possible. You can reduce the number of germs on your skin by washing with CHG (chlorahexidine gluconate) Soap before surgery.  CHG is an antiseptic cleaner which kills germs and bonds with the skin to continue killing germs even after washing.     Please do not use if you have an allergy to CHG or antibacterial soaps. If your skin becomes reddened/irritated stop using the CHG.  Do not shave (including legs and underarms) for at least 48 hours  prior to first CHG shower. It is OK to shave your face.  Please follow these instructions carefully.     Shower the NIGHT BEFORE SURGERY and the MORNING OF SURGERY with CHG Soap.   If you chose to wash your hair, wash your hair first as usual with your normal shampoo. After you shampoo, rinse your hair and body thoroughly to remove the shampoo.  Then [employer] and genitals (private parts) with your normal soap and rinse thoroughly to remove soap.  After that Use CHG Soap as you would any other liquid soap. You can apply CHG directly to the skin and wash gently with a scrungie or a clean washcloth.   Apply the CHG Soap to your body ONLY FROM THE NECK DOWN.  Do not use on open wounds or open sores. Avoid contact with your eyes, ears, mouth and genitals (private parts). Wash Face and  genitals (private parts)  with your normal soap.   Wash thoroughly, paying special attention to the area where your surgery will be performed.  Thoroughly rinse your body with warm water from the neck down.  DO NOT shower/wash with your normal soap after using and rinsing off the CHG Soap.  Pat yourself dry with a CLEAN TOWEL.  Wear CLEAN PAJAMAS to bed the night before surgery  Place CLEAN SHEETS on your bed the night before your surgery  DO NOT SLEEP WITH PETS.   Day of Surgery:  Take a shower with CHG soap. Wear Clean/Comfortable clothing the morning of surgery Do not apply any deodorants/lotions.   Remember to brush your teeth WITH YOUR REGULAR TOOTHPASTE.    If you received a COVID test during your pre-op visit, it is requested that you wear a mask when out in public, stay away from anyone that may not be feeling well, and notify your surgeon if you develop symptoms. If you have been in contact with anyone that has tested positive in the last 10 days, please notify your surgeon.    Please read over the following fact sheets that you were given.

## 2022-12-23 ENCOUNTER — Other Ambulatory Visit: Payer: Self-pay

## 2022-12-23 ENCOUNTER — Ambulatory Visit (HOSPITAL_COMMUNITY)
Admission: RE | Admit: 2022-12-23 | Discharge: 2022-12-23 | Disposition: A | Discharge status: Home / Self Care | Payer: BC Managed Care – PPO | Source: Ambulatory Visit | Attending: Surgery | Admitting: Surgery

## 2022-12-23 ENCOUNTER — Encounter (HOSPITAL_COMMUNITY): Payer: Self-pay

## 2022-12-23 ENCOUNTER — Encounter (HOSPITAL_COMMUNITY)
Admission: RE | Admit: 2022-12-23 | Discharge: 2022-12-23 | Disposition: A | Discharge status: Home / Self Care | Payer: BC Managed Care – PPO | Source: Ambulatory Visit | Attending: Surgery | Admitting: Surgery

## 2022-12-23 VITALS — BP 156/75 | HR 50 | Temp 97.8°F | Resp 17 | Ht 67.0 in | Wt 248.0 lb

## 2022-12-23 DIAGNOSIS — I351 Nonrheumatic aortic (valve) insufficiency: Secondary | ICD-10-CM

## 2022-12-23 DIAGNOSIS — Z951 Presence of aortocoronary bypass graft: Secondary | ICD-10-CM | POA: Insufficient documentation

## 2022-12-23 DIAGNOSIS — I7121 Aneurysm of the ascending aorta, without rupture: Secondary | ICD-10-CM | POA: Insufficient documentation

## 2022-12-23 DIAGNOSIS — E119 Type 2 diabetes mellitus without complications: Secondary | ICD-10-CM | POA: Diagnosis not present

## 2022-12-23 DIAGNOSIS — Q231 Congenital insufficiency of aortic valve: Secondary | ICD-10-CM | POA: Insufficient documentation

## 2022-12-23 DIAGNOSIS — Z1152 Encounter for screening for COVID-19: Secondary | ICD-10-CM | POA: Diagnosis not present

## 2022-12-23 DIAGNOSIS — E785 Hyperlipidemia, unspecified: Secondary | ICD-10-CM | POA: Diagnosis not present

## 2022-12-23 DIAGNOSIS — Z01818 Encounter for other preprocedural examination: Secondary | ICD-10-CM

## 2022-12-23 DIAGNOSIS — I6523 Occlusion and stenosis of bilateral carotid arteries: Secondary | ICD-10-CM | POA: Insufficient documentation

## 2022-12-23 DIAGNOSIS — Z952 Presence of prosthetic heart valve: Secondary | ICD-10-CM | POA: Insufficient documentation

## 2022-12-23 DIAGNOSIS — I77819 Aortic ectasia, unspecified site: Secondary | ICD-10-CM | POA: Insufficient documentation

## 2022-12-23 HISTORY — DX: Sleep apnea, unspecified: G47.30

## 2022-12-23 LAB — CBC
HCT: 40.8 % (ref 39.0–52.0)
Hemoglobin: 13.5 g/dL (ref 13.0–17.0)
MCH: 30.8 pg (ref 26.0–34.0)
MCHC: 33.1 g/dL (ref 30.0–36.0)
MCV: 93.2 fL (ref 80.0–100.0)
Platelets: 242 10*3/uL (ref 150–400)
RBC: 4.38 MIL/uL (ref 4.22–5.81)
RDW: 12.3 % (ref 11.5–15.5)
WBC: 6.1 10*3/uL (ref 4.0–10.5)
nRBC: 0 % (ref 0.0–0.2)

## 2022-12-23 LAB — URINALYSIS, ROUTINE W REFLEX MICROSCOPIC
Bilirubin Urine: NEGATIVE
Glucose, UA: NEGATIVE mg/dL
Ketones, ur: NEGATIVE mg/dL
Leukocytes,Ua: NEGATIVE
Nitrite: NEGATIVE
Protein, ur: NEGATIVE mg/dL
RBC / HPF: >50 RBC/hpf — ABNORMAL HIGH (ref 0–5)
Specific Gravity, Urine: 1.014 (ref 1.005–1.030)
pH: 5 (ref 5.0–8.0)

## 2022-12-23 LAB — COMPREHENSIVE METABOLIC PANEL
ALT: 40 U/L (ref 0–44)
AST: 26 U/L (ref 15–41)
Albumin: 4.3 g/dL (ref 3.5–5.0)
Alkaline Phosphatase: 29 U/L — ABNORMAL LOW (ref 38–126)
Anion gap: 6 (ref 5–15)
BUN: 15 mg/dL (ref 6–20)
CO2: 27 mmol/L (ref 22–32)
Calcium: 8.9 mg/dL (ref 8.9–10.3)
Chloride: 104 mmol/L (ref 98–111)
Creatinine, Ser: 0.96 mg/dL (ref 0.61–1.24)
GFR, Estimated: >60 mL/min (ref >60)
Glucose, Bld: 95 mg/dL (ref 70–99)
Potassium: 4.2 mmol/L (ref 3.5–5.1)
Sodium: 137 mmol/L (ref 135–145)
Total Bilirubin: 0.8 mg/dL (ref 0.3–1.2)
Total Protein: 7.2 g/dL (ref 6.5–8.1)

## 2022-12-23 LAB — SARS CORONAVIRUS 2 (TAT 6-24 HRS): SARS Coronavirus 2: NEGATIVE

## 2022-12-23 LAB — PROTIME-INR
INR: 1 (ref 0.8–1.2)
Prothrombin Time: 13.3 seconds (ref 11.4–15.2)

## 2022-12-23 LAB — SURGICAL PCR SCREEN
MRSA, PCR: NEGATIVE
Staphylococcus aureus: POSITIVE — AB

## 2022-12-23 LAB — APTT: aPTT: 31 seconds (ref 24–36)

## 2022-12-23 LAB — GLUCOSE, CAPILLARY: Glucose-Capillary: 122 mg/dL — ABNORMAL HIGH (ref 70–99)

## 2022-12-23 NOTE — Progress Notes (Addendum)
Surgical Instructions    Your procedure is scheduled on Monday December 27, 2022.   Report to [facility] Main Entrance "A" at 5:30 A.M., then check in with the Admitting office.  Call this number if you have problems the morning of surgery:  [phone]   If you have any questions prior to your surgery date call [phone]: Open Monday-Friday 8am-4pm If you experience any cold or flu symptoms such as cough, fever, chills, shortness of breath, etc. between now and your scheduled surgery, please notify us at the above number     Remember:  Do not eat or drink after midnight the night before your surgery    Take these medicines the morning of surgery with A SIP OF WATER:   fenofibrate    As of today, STOP taking any  (unless otherwise instructed by your surgeon) Aleve, Naproxen, Ibuprofen, Motrin, Advil, Goody's, BC's, all herbal medications, fish oil, and all vitamins.  WHAT DO I DO ABOUT MY DIABETES MEDICATION?   Do not take oral diabetes medicines (pills) the morning of surgery.  DO NOT TAKE glipiZIDE THE NIGHT BEFORE OR MORNING OF SURGERY. DO NOT TAKE pioglitazone (ACTOS)  THE MORNING OF SURGERY.  THE NIGHT BEFORE SURGERY, take 70 percent of your normal dose of NOVOLIN 70/30 KWIKPEN. Which will be 12 units.  THE MORNING OF SURGERY, DO NOT TAKE ANY INSULIN.  HOW TO MANAGE YOUR DIABETES BEFORE AND AFTER SURGERY  Why is it important to control my blood sugar before and after surgery? Improving blood sugar levels before and after surgery helps healing and can limit problems. A way of improving blood sugar control is eating a healthy diet by:  Eating less sugar and carbohydrates  Increasing activity/exercise  Talking with your doctor about reaching your blood sugar goals High blood sugars (greater than 180 mg/dL) can raise your risk of infections and slow your recovery, so you will need to focus on controlling your diabetes during the weeks before surgery. Make sure that the  doctor who takes care of your diabetes knows about your planned surgery including the date and location.  How do I manage my blood sugar before surgery? Check your blood sugar at least 4 times a day, starting 2 days before surgery, to make sure that the level is not too high or low.  Check your blood sugar the morning of your surgery when you wake up and every 2 hours until you get to the Short Stay unit.  If your blood sugar is less than 70 mg/dL, you will need to treat for low blood sugar: Do not take insulin. Treat a low blood sugar (less than 70 mg/dL) with  cup of clear juice (cranberry or apple), 4 glucose tablets, OR glucose gel. Recheck blood sugar in 15 minutes after treatment (to make sure it is greater than 70 mg/dL). If your blood sugar is not greater than 70 mg/dL on recheck, call [phone] for further instructions. Report your blood sugar to the short stay nurse when you get to Short Stay.  If you are admitted to the hospital after surgery: Your blood sugar will be checked by the staff and you will probably be given insulin after surgery (instead of oral diabetes medicines) to make sure you have good blood sugar levels. The goal for blood sugar control after surgery is 80-180 mg/dL.              [facility] is not responsible for any belongings or valuables.    Do  NOT Smoke (Tobacco/Vaping)  24 hours prior to your procedure  If you use a CPAP at night, you may bring your mask for your overnight stay.   Contacts, glasses, hearing aids, dentures or partials may not be worn into surgery, please bring cases for these belongings   For patients admitted to the hospital, discharge time will be determined by your treatment team.   Patients discharged the day of surgery will not be allowed to drive home, and someone needs to stay with them for 24 hours.   SURGICAL WAITING ROOM VISITATION Patients having surgery or a procedure may have no more than 2 support people in the  waiting area - these visitors may rotate.   Children under the age of 19 must have an adult with them who is not the patient. If the patient needs to stay at the hospital during part of their recovery, the visitor guidelines for inpatient rooms apply. Pre-op nurse will coordinate an appropriate time for 1 support person to accompany patient in pre-op.  This support person may not rotate.   Please refer to [email or web address] for the visitor guidelines for Inpatients (after your surgery is over and you are in a regular room).    Special instructions:    Oral Hygiene is also important to reduce your risk of infection.  Remember - BRUSH YOUR TEETH THE MORNING OF SURGERY WITH YOUR REGULAR TOOTHPASTE   [facility]- Preparing For Surgery  Before surgery, you can play an important role. Because skin is not sterile, your skin needs to be as free of germs as possible. You can reduce the number of germs on your skin by washing with CHG (chlorahexidine gluconate) Soap before surgery.  CHG is an antiseptic cleaner which kills germs and bonds with the skin to continue killing germs even after washing.     Please do not use if you have an allergy to CHG or antibacterial soaps. If your skin becomes reddened/irritated stop using the CHG.  Do not shave (including legs and underarms) for at least 48 hours prior to first CHG shower. It is OK to shave your face.  Please follow these instructions carefully.     Shower the NIGHT BEFORE SURGERY and the MORNING OF SURGERY with CHG Soap.   If you chose to wash your hair, wash your hair first as usual with your normal shampoo. After you shampoo, rinse your hair and body thoroughly to remove the shampoo.  Then [employer] and genitals (private parts) with your normal soap and rinse thoroughly to remove soap.  After that Use CHG Soap as you would any other liquid soap. You can apply CHG directly to the skin and wash  gently with a scrungie or a clean washcloth.   Apply the CHG Soap to your body ONLY FROM THE NECK DOWN.  Do not use on open wounds or open sores. Avoid contact with your eyes, ears, mouth and genitals (private parts). Wash Face and genitals (private parts)  with your normal soap.   Wash thoroughly, paying special attention to the area where your surgery will be performed.  Thoroughly rinse your body with warm water from the neck down.  DO NOT shower/wash with your normal soap after using and rinsing off the CHG Soap.  Pat yourself dry with a CLEAN TOWEL.  Wear CLEAN PAJAMAS to bed the night before surgery  Place CLEAN SHEETS on your bed the night before your surgery  DO NOT SLEEP WITH PETS.   Day of Surgery:  Take a shower with CHG soap. Wear Clean/Comfortable clothing the morning of surgery Do not wear jewelry or makeup. Do not wear lotions, powders, perfumes/cologne or deodorant. Do not shave 48 hours prior to surgery.  Men may shave face and neck. Do not bring valuables to the hospital. Do not wear nail polish, gel polish, artificial nails, or any other type of covering on natural nails (fingers and toes) If you have artificial nails or gel coating that need to be removed by a nail salon, please have this removed prior to surgery. Artificial nails or gel coating may interfere with anesthesia's ability to adequately monitor your vital signs.   Remember to brush your teeth WITH YOUR REGULAR TOOTHPASTE.    If you received a COVID test during your pre-op visit, it is requested that you wear a mask when out in public, stay away from anyone that may not be feeling well, and notify your surgeon if you develop symptoms. If you have been in contact with anyone that has tested positive in the last 10 days, please notify your surgeon.    Please read over the following fact sheets that you were given.

## 2022-12-23 NOTE — Progress Notes (Signed)
VASCULAR LAB    Pre CABG Dopplers have been performed.  See CV proc for preliminary results.   Shivani Barrantes, RVT 12/23/2022, 10:31 AM

## 2022-12-23 NOTE — Progress Notes (Signed)
PCP - Guadlupe Spanish, MD  Cardiologist - Janina Mayo, MD   PPM/ICD - N/A  Chest x-ray - 12/23/2022  EKG - 12/02/22 Stress Test - denies ECHO - 10/04/22 Cardiac Cath - 12/17/22  Sleep Study - 03/19/2019 CPAP - wears CPAP- does not know pressure settings.   Fasting Blood Sugar - Checks CBGs daily- typically 90-130. CBC 122 at PAT. Pt reports his last Hgb A1c was done on 12/01/22 and was 6. Levonne Spiller, RN notified. No need to draw if records received, records requested.     Blood Thinner Instructions: N/A Aspirin Instructions:Follow your surgeon's instructions on when to stop Aspirin.  If no instructions were given by your surgeon then you will need to call the office to get those instructions.     ERAS Protcol - NPO order   COVID TEST- 12/23/2022    Anesthesia review: yes, follow up requested records- last office note, lab work, Hgb A1c requested from PCP.   Patient denies shortness of breath, fever, cough and chest pain at PAT appointment   All instructions explained to the patient, with a verbal understanding of the material. Patient agrees to go over the instructions while at home for a better understanding. Patient also instructed to self quarantine after being tested for COVID-19. The opportunity to ask questions was provided.

## 2022-12-23 NOTE — Progress Notes (Signed)
Surgical Instructions    Your procedure is scheduled on Monday December 27, 2022.   Report to [facility] Main Entrance "A" at 5:30 A.M., then check in with the Admitting office.  Call this number if you have problems the morning of surgery:  [phone]   If you have any questions prior to your surgery date call [phone]: Open Monday-Friday 8am-4pm If you experience any cold or flu symptoms such as cough, fever, chills, shortness of breath, etc. between now and your scheduled surgery, please notify us at the above number     Remember:  Do not eat or drink after midnight the night before your surgery    Take these medicines the morning of surgery with A SIP OF WATER:   fenofibrate    As of today, STOP taking any Aspirin (unless otherwise instructed by your surgeon) Aleve, Naproxen, Ibuprofen, Motrin, Advil, Goody's, BC's, all herbal medications, fish oil, and all vitamins.  WHAT DO I DO ABOUT MY DIABETES MEDICATION?   Do not take oral diabetes medicines (pills) the morning of surgery.  DO NOT TAKE glipiZIDE THE NIGHT BEFORE OR MORNING OF SURGERY. DO NOT TAKE pioglitazone (ACTOS)  THE MORNING OF SURGERY.  THE NIGHT BEFORE SURGERY, take 70 percent of your normal dose of NOVOLIN 70/30 KWIKPEN. Which will be 12 units.  THE MORNING OF SURGERY, DO NOT TAKE ANY INSULIN.  HOW TO MANAGE YOUR DIABETES BEFORE AND AFTER SURGERY  Why is it important to control my blood sugar before and after surgery? Improving blood sugar levels before and after surgery helps healing and can limit problems. A way of improving blood sugar control is eating a healthy diet by:  Eating less sugar and carbohydrates  Increasing activity/exercise  Talking with your doctor about reaching your blood sugar goals High blood sugars (greater than 180 mg/dL) can raise your risk of infections and slow your recovery, so you will need to focus on controlling your diabetes during the weeks before surgery. Make sure  that the doctor who takes care of your diabetes knows about your planned surgery including the date and location.  How do I manage my blood sugar before surgery? Check your blood sugar at least 4 times a day, starting 2 days before surgery, to make sure that the level is not too high or low.  Check your blood sugar the morning of your surgery when you wake up and every 2 hours until you get to the Short Stay unit.  If your blood sugar is less than 70 mg/dL, you will need to treat for low blood sugar: Do not take insulin. Treat a low blood sugar (less than 70 mg/dL) with  cup of clear juice (cranberry or apple), 4 glucose tablets, OR glucose gel. Recheck blood sugar in 15 minutes after treatment (to make sure it is greater than 70 mg/dL). If your blood sugar is not greater than 70 mg/dL on recheck, call [phone] for further instructions. Report your blood sugar to the short stay nurse when you get to Short Stay.  If you are admitted to the hospital after surgery: Your blood sugar will be checked by the staff and you will probably be given insulin after surgery (instead of oral diabetes medicines) to make sure you have good blood sugar levels. The goal for blood sugar control after surgery is 80-180 mg/dL.              [facility] is not responsible for any belongings or valuables.    Do  NOT Smoke (Tobacco/Vaping)  24 hours prior to your procedure  If you use a CPAP at night, you may bring your mask for your overnight stay.   Contacts, glasses, hearing aids, dentures or partials may not be worn into surgery, please bring cases for these belongings   For patients admitted to the hospital, discharge time will be determined by your treatment team.   Patients discharged the day of surgery will not be allowed to drive home, and someone needs to stay with them for 24 hours.   SURGICAL WAITING ROOM VISITATION Patients having surgery or a procedure may have no more than 2 support people  in the waiting area - these visitors may rotate.   Children under the age of 15 must have an adult with them who is not the patient. If the patient needs to stay at the hospital during part of their recovery, the visitor guidelines for inpatient rooms apply. Pre-op nurse will coordinate an appropriate time for 1 support person to accompany patient in pre-op.  This support person may not rotate.   Please refer to [email or web address] for the visitor guidelines for Inpatients (after your surgery is over and you are in a regular room).    Special instructions:    Oral Hygiene is also important to reduce your risk of infection.  Remember - BRUSH YOUR TEETH THE MORNING OF SURGERY WITH YOUR REGULAR TOOTHPASTE   [facility]- Preparing For Surgery  Before surgery, you can play an important role. Because skin is not sterile, your skin needs to be as free of germs as possible. You can reduce the number of germs on your skin by washing with CHG (chlorahexidine gluconate) Soap before surgery.  CHG is an antiseptic cleaner which kills germs and bonds with the skin to continue killing germs even after washing.     Please do not use if you have an allergy to CHG or antibacterial soaps. If your skin becomes reddened/irritated stop using the CHG.  Do not shave (including legs and underarms) for at least 48 hours prior to first CHG shower. It is OK to shave your face.  Please follow these instructions carefully.     Shower the NIGHT BEFORE SURGERY and the MORNING OF SURGERY with CHG Soap.   If you chose to wash your hair, wash your hair first as usual with your normal shampoo. After you shampoo, rinse your hair and body thoroughly to remove the shampoo.  Then [employer] and genitals (private parts) with your normal soap and rinse thoroughly to remove soap.  After that Use CHG Soap as you would any other liquid soap. You can apply CHG directly to the skin and  wash gently with a scrungie or a clean washcloth.   Apply the CHG Soap to your body ONLY FROM THE NECK DOWN.  Do not use on open wounds or open sores. Avoid contact with your eyes, ears, mouth and genitals (private parts). Wash Face and genitals (private parts)  with your normal soap.   Wash thoroughly, paying special attention to the area where your surgery will be performed.  Thoroughly rinse your body with warm water from the neck down.  DO NOT shower/wash with your normal soap after using and rinsing off the CHG Soap.  Pat yourself dry with a CLEAN TOWEL.  Wear CLEAN PAJAMAS to bed the night before surgery  Place CLEAN SHEETS on your bed the night before your surgery  DO NOT SLEEP WITH PETS.   Day of Surgery:  Take a shower with CHG soap. Wear Clean/Comfortable clothing the morning of surgery Do not wear jewelry or makeup. Do not wear lotions, powders, perfumes/cologne or deodorant. Do not shave 48 hours prior to surgery.  Men may shave face and neck. Do not bring valuables to the hospital. Do not wear nail polish, gel polish, artificial nails, or any other type of covering on natural nails (fingers and toes) If you have artificial nails or gel coating that need to be removed by a nail salon, please have this removed prior to surgery. Artificial nails or gel coating may interfere with anesthesia's ability to adequately monitor your vital signs.   Remember to brush your teeth WITH YOUR REGULAR TOOTHPASTE.    If you received a COVID test during your pre-op visit, it is requested that you wear a mask when out in public, stay away from anyone that may not be feeling well, and notify your surgeon if you develop symptoms. If you have been in contact with anyone that has tested positive in the last 10 days, please notify your surgeon.    Please read over the following fact sheets that you were given.

## 2022-12-24 MED ORDER — TRANEXAMIC ACID (OHS) PUMP PRIME SOLUTION
2.0000 mg/kg | INTRAVENOUS | Status: DC
  Filled 2022-12-24: qty 2.25

## 2022-12-24 MED ORDER — EPINEPHRINE HCL 5 MG/250ML IV SOLN IN NS
0.0000 ug/min | INTRAVENOUS | Status: DC
  Filled 2022-12-24: qty 250

## 2022-12-24 MED ORDER — CEFAZOLIN SODIUM-DEXTROSE 2-4 GM/100ML-% IV SOLN
2.0000 g | INTRAVENOUS | Status: DC
  Filled 2022-12-24: qty 100

## 2022-12-24 MED ORDER — NOREPINEPHRINE 4 MG/250ML-% IV SOLN
0.0000 ug/min | INTRAVENOUS | Status: DC
  Filled 2022-12-24: qty 250

## 2022-12-24 MED ORDER — POTASSIUM CHLORIDE 2 MEQ/ML IV SOLN
80.0000 meq | INTRAVENOUS | Status: DC
  Filled 2022-12-24: qty 40

## 2022-12-24 MED ORDER — TRANEXAMIC ACID (OHS) BOLUS VIA INFUSION
15.0000 mg/kg | INTRAVENOUS | Status: AC
  Administered 2022-12-27: 1687.5 mg via INTRAVENOUS
  Filled 2022-12-24: qty 1688

## 2022-12-24 MED ORDER — MILRINONE LACTATE IN DEXTROSE 20-5 MG/100ML-% IV SOLN
0.3000 ug/kg/min | INTRAVENOUS | Status: DC
  Filled 2022-12-24: qty 100

## 2022-12-24 MED ORDER — DEXMEDETOMIDINE HCL IN NACL 400 MCG/100ML IV SOLN
0.1000 ug/kg/h | INTRAVENOUS | Status: DC
  Filled 2022-12-24: qty 100

## 2022-12-24 MED ORDER — INSULIN REGULAR(HUMAN) IN NACL 100-0.9 UT/100ML-% IV SOLN
INTRAVENOUS | Status: DC
  Filled 2022-12-24: qty 100

## 2022-12-24 MED ORDER — PHENYLEPHRINE HCL-NACL 20-0.9 MG/250ML-% IV SOLN
30.0000 ug/min | INTRAVENOUS | Status: DC
  Filled 2022-12-24: qty 250

## 2022-12-24 MED ORDER — MANNITOL 20 % IV SOLN
INTRAVENOUS | Status: DC
  Filled 2022-12-24: qty 13

## 2022-12-24 MED ORDER — HEPARIN 30,000 UNITS/1000 ML (OHS) CELLSAVER SOLUTION
Status: DC
  Filled 2022-12-24: qty 1000

## 2022-12-24 MED ORDER — VANCOMYCIN HCL 1500 MG/300ML IV SOLN
1500.0000 mg | INTRAVENOUS | Status: AC
  Administered 2022-12-27: 1500 mg via INTRAVENOUS
  Filled 2022-12-24: qty 300

## 2022-12-24 MED ORDER — TRANEXAMIC ACID 1000 MG/10ML IV SOLN
1.5000 mg/kg/h | INTRAVENOUS | Status: AC
  Administered 2022-12-27: 1.5 mg/kg/h via INTRAVENOUS
  Filled 2022-12-24 (×2): qty 25

## 2022-12-24 MED ORDER — PLASMA-LYTE A IV SOLN
INTRAVENOUS | Status: DC
  Filled 2022-12-24: qty 2.5

## 2022-12-25 NOTE — H&P (Signed)
[address]       Douglas Phillips [address]             [phone]      Cardiothoracic Surgery Admission History and Physical   HPI:   The patient is a 47 year old gentleman has a bicuspid aortic valve with mild to moderate AI and normal LV function and a left ventricular diastolic dimension of 5.0 cm on echocardiogram in October 2022.  He also has a 5.2 cm sinus diameter on gated cardiac CTA from 04/02/2022 which is unchanged and a 4.7 cm ascending aorta on CTA of the chest which is slightly larger than on his prior CTA in October 2022 when it was measured at 4.3 cm. Coronary calcium score was 1592 but FFR was normal in all three coronary vessels in April 2023.  His most recent echo on 10/04/2022 showed a probable bicuspid aortic valve with moderate aortic insufficiency.  The mean gradient across aortic valve was only 4 mmHg.  The aortic root was measured at 4.9 cm with the ascending aorta also measuring 4.9 cm.  Left ventricular ejection fraction was 60 to 65% with grade 2 diastolic dysfunction.  The left ventricular diastolic diameter has increased to 5.5 cm.   He denies any chest pain or shortness of breath.  He denies orthopnea or PND.  He said no peripheral edema.  He saw Dr. Carolan Clines recently.  His main complaint today is related to ocular migraines for which he is seeing Dr. Delena Bali.  He recently had an MRI of the brain that was unremarkable.  He said that his ocular migraines are brought on by stress.         Current Outpatient Medications  Medication Sig Dispense Refill   atorvastatin (LIPITOR) 40 MG tablet Take 1 tablet (40 mg total) by mouth daily. 90 tablet 3   blood glucose meter kit and supplies Dispense based on patient and insurance preference. Use one time daily. ICD10: E11.65 1 each 0   fenofibrate 54 MG tablet Take 1 tablet (54 mg total) by mouth daily. 90 tablet 1   glipiZIDE (GLUCOTROL XL) 5 MG 24 hr tablet Take 1 tablet (5 mg total) by mouth daily with  breakfast. 90 tablet 0   glucose blood (ONETOUCH VERIO) test strip 1 each by Other route in the morning and at bedtime. Use as instructed 100 each 1   [START ON 01/27/2023] lamoTRIgine (LAMICTAL) 100 MG tablet Take 1 tablet (100 mg total) by mouth 2 (two) times daily. 60 tablet 5   lamoTRIgine (LAMICTAL) 25 MG tablet Take 1 pill at bedtime for 2 weeks. Then take 1 pill twice a day for 2 weeks. Then take 1 pill in AM and 2 pills in PM for one week. Then take 2 pills twice a day for one week. Then take 2 pills in AM and 3 pills in PM for one week. Then take 3 pills twice a day for one week. Then take 3 pills in AM and 4 pills in PM for one week. Then take 4 pills twice a day until pills are gone. Then start 100 mg tablets. 273 tablet 0   NOVOLIN 70/30 KWIKPEN (70-30) 100 UNIT/ML KwikPen SMARTSIG:20 Unit(s) SUB-Q Twice Daily       pioglitazone (ACTOS) 15 MG tablet Take 0.5 tablets (7.5 mg total) by mouth daily. With food 45 tablet 0   VITAMIN D PO Take by mouth.  No current facility-administered medications for this visit.        Physical Exam: BP (!) 163/85 (BP Location: Left Arm, Patient Position: Sitting)   Pulse (!) 47   Resp 18   Ht 5\' 7"  (1.702 m)   Wt 254 lb (115.2 kg)   SpO2 98% Comment: RA  BMI 39.78 kg/m  He looks well. Cardiac exam shows a regular rate and rhythm with a normal S1 and S2.  There is no murmur. Lungs are clear. There is no peripheral edema.   Diagnostic Tests:   Narrative & Impression  CLINICAL DATA:  A 47 year old male presents for follow-up of aortic aneurysm.   EXAM: CT ANGIOGRAPHY CHEST WITH CONTRAST   TECHNIQUE: Multidetector CT imaging of the chest was performed using the standard protocol during bolus administration of intravenous contrast. Multiplanar CT image reconstructions and MIPs were obtained to evaluate the vascular anatomy.   RADIATION DOSE REDUCTION: This exam was performed according to the departmental dose-optimization program  which includes automated exposure control, adjustment of the mA and/or kV according to patient size and/or use of iterative reconstruction technique.   CONTRAST:  25mL ISOVUE-370 IOPAMIDOL (ISOVUE-370) INJECTION 76%   COMPARISON:  April 02, 2022   FINDINGS: Cardiovascular: Heart size is stable with extensive 3 vessel coronary artery disease. No pericardial effusion or sign of pericardial nodularity.   Ascending thoracic aorta/aortic root with dilation of 4.7 cm in the axial plane and approximately 4.5 cm in the coronal plane is similar to most recent imaging from April of 2023. Aortic sinus near 5 cm accounting for motion artifact also measured in coronal plane.   Descending thoracic aorta with normal caliber. No stranding adjacent to the aorta. Aorta displays a bovine arch branching pattern.   Central pulmonary vasculature unremarkable on venous phase.   Mediastinum/Nodes: No thoracic inlet, axillary, mediastinal or hilar adenopathy. Esophagus grossly normal.   Lungs/Pleura: No consolidation. No sign of pleural effusion. Airways are patent.   Upper Abdomen: Incidental imaging of upper abdominal contents is unremarkable. Imaged portions of liver, gallbladder, pancreas, spleen, adrenal glands and kidneys without acute process.   Musculoskeletal: No chest wall abnormality. No acute or significant osseous findings.   Review of the MIP images confirms the above findings.   IMPRESSION: 1. Stable aneurysmal dilation of the ascending thoracic aorta/aortic root. Aortic sinus near 5 cm accounting for motion artifact also measured in coronal plane. Correlate with any signs of concomitant valvular dysfunction. Ascending thoracic aortic aneurysm. Recommend semi-annual imaging followup by CTA or MRA and referral to cardiothoracic surgery if not already obtained. This recommendation follows 2010 ACCF/AHA/AATS/ACR/ASA/SCA/SCAI/SIR/STS/SVM Guidelines for the Diagnosis and Management of  Patients With Thoracic Aortic Disease. Circulation. 2010; 121: Z610-R604. Aortic aneurysm NOS (ICD10-I71.9) 2. Coronary artery disease. 3. Cardiomegaly 4. No acute findings in the chest.     Electronically Signed   By: Zetta Bills M.D.   On: 11/22/2022 10:31        ECHOCARDIOGRAM REPORT       Patient Name:   Douglas Phillips Date of Exam: 10/04/2022  Medical Rec #:  [id number]          Height:       67.0 in  Accession #:    [id number]         Weight:       240.0 lb  Date of Birth:  [birth year]           BSA:          2.185 m  Patient Age:    8 years           BP:           149/80 mmHg  Patient Gender: M                  HR:           55 bpm.  Exam Location:  Outpatient   Procedure: 2D Echo, Cardiac Doppler and Color Doppler   Indications:    Aortic valve disorder I35.9    History:        Patient has prior history of Echocardiogram examinations,  most                 recent 10/08/2021. Risk Factors:Hypertension, Diabetes and                  Dyslipidemia.    Sonographer:    Darlina Sicilian RDCS  Referring Phys: [place]     1. Left ventricular ejection fraction, by estimation, is 60 to 65%. The  left ventricle has normal function. The left ventricle has no regional  wall motion abnormalities. Left ventricular diastolic parameters are  consistent with Grade II diastolic  dysfunction (pseudonormalization). Elevated left ventricular end-diastolic  pressure.   2. Right ventricular systolic function is normal. The right ventricular  size is normal.   3. The mitral valve is normal in structure. No evidence of mitral valve  regurgitation. No evidence of mitral stenosis.   4. Cannot r/o bucuspid valve with fused right and left cusps Overall poor  quality sutdy especially color flow . The aortic valve is abnormal. Aortic  valve regurgitation is moderate. No aortic stenosis is present.   5. Aortic dilatation noted. There is severe dilatation of  the aortic  root, measuring 49 mm. There is severe dilatation of the ascending aorta,  measuring 49 mm.   6. The inferior vena cava is normal in size with greater than 50%  respiratory variability, suggesting right atrial pressure of 3 mmHg.   FINDINGS   Left Ventricle: Left ventricular ejection fraction, by estimation, is 60  to 65%. The left ventricle has normal function. The left ventricle has no  regional wall motion abnormalities. The left ventricular internal cavity  size was normal in size. There is   no left ventricular hypertrophy. Left ventricular diastolic parameters  are consistent with Grade II diastolic dysfunction (pseudonormalization).  Elevated left ventricular end-diastolic pressure.   Right Ventricle: The right ventricular size is normal. No increase in  right ventricular wall thickness. Right ventricular systolic function is  normal.   Left Atrium: Left atrial size was normal in size.   Right Atrium: Right atrial size was normal in size.   Pericardium: There is no evidence of pericardial effusion.   Mitral Valve: The mitral valve is normal in structure. No evidence of  mitral valve regurgitation. No evidence of mitral valve stenosis.   Tricuspid Valve: The tricuspid valve is normal in structure. Tricuspid  valve regurgitation is not demonstrated. No evidence of tricuspid  stenosis.   Aortic Valve: Cannot r/o bucuspid valve with fused right and left cusps  Overall poor quality sutdy especially color flow. The aortic valve is  abnormal. Aortic valve regurgitation is moderate. Aortic regurgitation PHT  measures 1041 msec. No aortic  stenosis is present. Aortic valve mean gradient measures 4.0 mmHg. Aortic  valve peak gradient measures 9.1 mmHg. Aortic valve area, by VTI measures  4.33 cm.  Pulmonic Valve: The pulmonic valve was normal in structure. Pulmonic valve  regurgitation is not visualized. No evidence of pulmonic stenosis.   Aorta: Aortic  dilatation noted. There is severe dilatation of the aortic  root, measuring 49 mm. There is severe dilatation of the ascending aorta,  measuring 49 mm.   Venous: The inferior vena cava is normal in size with greater than 50%  respiratory variability, suggesting right atrial pressure of 3 mmHg.   IAS/Shunts: No atrial level shunt detected by color flow Doppler.     LEFT VENTRICLE  PLAX 2D  LVIDd:         5.50 cm   Diastology  LVIDs:         3.50 cm   LV e' medial:    8.27 cm/s  LV PW:         1.00 cm   LV E/e' medial:  13.2  LV IVS:        1.10 cm   LV e' lateral:   7.29 cm/s  LVOT diam:     2.70 cm   LV E/e' lateral: 15.0  LV SV:         140  LV SV Index:   64  LVOT Area:     5.73 cm     RIGHT VENTRICLE  RV S prime:     13.60 cm/s  TAPSE (M-mode): 1.7 cm   LEFT ATRIUM             Index  LA diam:        3.10 cm 1.42 cm/m  LA Vol (A2C):   38.9 ml 17.80 ml/m  LA Vol (A4C):   41.9 ml 19.17 ml/m  LA Biplane Vol: 40.5 ml 18.53 ml/m   AORTIC VALVE  AV Area (Vmax):    3.56 cm  AV Area (Vmean):   4.38 cm  AV Area (VTI):     4.33 cm  AV Vmax:           151.00 cm/s  AV Vmean:          87.400 cm/s  AV VTI:            0.323 m  AV Peak Grad:      9.1 mmHg  AV Mean Grad:      4.0 mmHg  LVOT Vmax:         94.00 cm/s  LVOT Vmean:        66.900 cm/s  LVOT VTI:          0.244 m  LVOT/AV VTI ratio: 0.76  AI PHT:            1041 msec    AORTA  Ao Root diam: 4.90 cm  Ao STJ diam:  3.7 cm  Ao Asc diam:  4.70 cm   MITRAL VALVE  MV Area (PHT): 3.02 cm     SHUNTS  MV Decel Time: 251 msec     Systemic VTI:  0.24 m  MR PISA:        2.26 cm    Systemic Diam: 2.70 cm  MR PISA Radius: 0.60 cm  MV E velocity: 109.00 cm/s  MV A velocity: 101.00 cm/s  MV E/A ratio:  1.08   Charlton Haws MD  Electronically signed by Charlton Haws MD  Signature Date/Time: 10/04/2022/11:03:04 AM        Final      Impression:   This 47 year old gentleman has a bicuspid aortic valve with  moderate aortic insufficiency  and a 5.0 cm aortic root aneurysm with a 4.7 cm ascending aortic aneurysm.  His left ventricular diastolic dimension has gone from 5.0 to 5.5 cm over the past year.  With a bicuspid aortic valve I think that it is best to proceed with replacement of his aortic valve, aortic root and ascending aortic aneurysm to decrease the risk of aortic dissection and progressive left ventricular dysfunction.  His aortic root is at the surgical threshold for younger patient with a bicuspid aortic valve and given his tendency to hypertension I think he is best treated surgically in the near future.  I reviewed his echocardiogram and CTA studies with him and answered his questions.  We discussed the pros and cons of mechanical and bioprosthetic valves and I think a mechanical valve would be best that his young age.  He has no contraindication to anticoagulation and feels that he could be compliant with taking Coumadin and maintaining follow-up.  He had preoperative cardiac catheterization since his previous cardiac CT had shown significant multivessel coronary plaque although his FFR was negative.  Cardiac cath on 12/17/22 showed moderate diffuse CAD with a high grade lesion of a small distal LAD that did not reach the apex. I don't think any coronary revascularization is warranted.  I discussed the operative procedure with the patient including alternatives, benefits and risks; including but not limited to bleeding, blood transfusion, infection, stroke, myocardial infarction, graft failure, heart block requiring a permanent pacemaker, organ dysfunction, and death.  [name] understands and agrees to proceed.     Plan:   Bentall procedure using an On-X mechanical valve conduit under deep hypothermic arrest.     Alleen Borne, MD [facility] [phone]

## 2022-12-26 MED ORDER — MANNITOL 20 % IV SOLN
INTRAVENOUS | Status: DC
  Filled 2022-12-26: qty 13

## 2022-12-26 MED ORDER — CEFAZOLIN SODIUM-DEXTROSE 2-4 GM/100ML-% IV SOLN
2.0000 g | INTRAVENOUS | Status: AC
  Administered 2022-12-27: 2 g via INTRAVENOUS
  Filled 2022-12-26 (×2): qty 100

## 2022-12-26 MED ORDER — PLASMA-LYTE A IV SOLN
INTRAVENOUS | Status: DC
  Filled 2022-12-26: qty 2.5

## 2022-12-26 MED ORDER — CEFAZOLIN SODIUM-DEXTROSE 2-4 GM/100ML-% IV SOLN
2.0000 g | INTRAVENOUS | Status: AC
  Administered 2022-12-27: 2 g via INTRAVENOUS
  Filled 2022-12-26: qty 100

## 2022-12-26 NOTE — Anesthesia Preprocedure Evaluation (Signed)
Anesthesia Evaluation  Patient identified by MRN, date of birth, ID band Patient awake    Reviewed: Allergy & Precautions, NPO status , Patient's Chart, lab work & pertinent test results  Airway Mallampati: IV  TM Distance: >3 FB Neck ROM: Full    Dental  (+) Missing   Pulmonary sleep apnea and Continuous Positive Airway Pressure Ventilation    Pulmonary exam normal        Cardiovascular hypertension, + CAD  Normal cardiovascular exam+ Valvular Problems/Murmurs  Rate:Normal  ECHO: 1. Left ventricular ejection fraction, by estimation, is 60 to 65%. The  left ventricle has normal function. The left ventricle has no regional  wall motion abnormalities. Left ventricular diastolic parameters are  consistent with Grade II diastolic  dysfunction (pseudonormalization). Elevated left ventricular end-diastolic  pressure.   2. Right ventricular systolic function is normal. The right ventricular  size is normal.   3. The mitral valve is normal in structure. No evidence of mitral valve  regurgitation. No evidence of mitral stenosis.   4. Cannot r/o bucuspid valve with fused right and left cusps Overall poor  quality sutdy especially color flow . The aortic valve is abnormal. Aortic  valve regurgitation is moderate. No aortic stenosis is present.   5. Aortic dilatation noted. There is severe dilatation of the aortic  root, measuring 49 mm. There is severe dilatation of the ascending aorta,  measuring 49 mm.   6. The inferior vena cava is normal in size with greater than 50%  respiratory variability, suggesting right atrial pressure of 3 mmHg.     Neuro/Psych  Headaches  negative psych ROS   GI/Hepatic negative GI ROS, Neg liver ROS,,,  Endo/Other  diabetes, Oral Hypoglycemic Agents, Insulin Dependent    Renal/GU Renal disease     Musculoskeletal negative musculoskeletal ROS (+)    Abdominal  (+) + obese  Peds   Hematology negative hematology ROS (+)   Anesthesia Other Findings TAA AI  BAV    Reproductive/Obstetrics                             Anesthesia Physical Anesthesia Plan  ASA: 4  Anesthesia Plan: General   Post-op Pain Management:    Induction: Intravenous  PONV Risk Score and Plan: 2 and Ondansetron, Dexamethasone, Midazolam and Treatment may vary due to age or medical condition  Airway Management Planned: Oral ETT  Additional Equipment: Arterial line, CVP, PA Cath, TEE and Ultrasound Guidance Line Placement  Intra-op Plan: Delibrate Circulatory arrest per surgeon request  Post-operative Plan: Post-operative intubation/ventilation  Informed Consent: I have reviewed the patients History and Physical, chart, labs and discussed the procedure including the risks, benefits and alternatives for the proposed anesthesia with the patient or authorized representative who has indicated his/her understanding and acceptance.     Dental advisory given  Plan Discussed with: CRNA  Anesthesia Plan Comments:        Anesthesia Quick Evaluation

## 2022-12-27 ENCOUNTER — Inpatient Hospital Stay (HOSPITAL_COMMUNITY): Payer: BC Managed Care – PPO

## 2022-12-27 ENCOUNTER — Encounter (HOSPITAL_COMMUNITY): Payer: Self-pay | Admitting: Surgery

## 2022-12-27 ENCOUNTER — Inpatient Hospital Stay (HOSPITAL_COMMUNITY): Payer: BC Managed Care – PPO | Admitting: Physician Assistant

## 2022-12-27 ENCOUNTER — Other Ambulatory Visit: Payer: Self-pay

## 2022-12-27 ENCOUNTER — Inpatient Hospital Stay (HOSPITAL_COMMUNITY)
Admission: RE | Disposition: A | Discharge status: Home / Self Care | Payer: Self-pay | Source: Home / Self Care | Attending: Surgery

## 2022-12-27 ENCOUNTER — Inpatient Hospital Stay (HOSPITAL_COMMUNITY)
Admission: RE | Admit: 2022-12-27 | Discharge: 2023-01-01 | DRG: 220 | Disposition: A | Discharge status: Home / Self Care | Payer: BC Managed Care – PPO | Attending: Surgery | Admitting: Surgery

## 2022-12-27 DIAGNOSIS — Y838 Other surgical procedures as the cause of abnormal reaction of the patient, or of later complication, without mention of misadventure at the time of the procedure: Secondary | ICD-10-CM | POA: Diagnosis not present

## 2022-12-27 DIAGNOSIS — J9811 Atelectasis: Secondary | ICD-10-CM | POA: Diagnosis not present

## 2022-12-27 DIAGNOSIS — I272 Pulmonary hypertension, unspecified: Secondary | ICD-10-CM | POA: Diagnosis not present

## 2022-12-27 DIAGNOSIS — I2510 Atherosclerotic heart disease of native coronary artery without angina pectoris: Secondary | ICD-10-CM | POA: Diagnosis present

## 2022-12-27 DIAGNOSIS — Z79899 Other long term (current) drug therapy: Secondary | ICD-10-CM | POA: Diagnosis not present

## 2022-12-27 DIAGNOSIS — Z7984 Long term (current) use of oral hypoglycemic drugs: Secondary | ICD-10-CM

## 2022-12-27 DIAGNOSIS — I3139 Other pericardial effusion (noninflammatory): Secondary | ICD-10-CM | POA: Diagnosis not present

## 2022-12-27 DIAGNOSIS — Z87442 Personal history of urinary calculi: Secondary | ICD-10-CM

## 2022-12-27 DIAGNOSIS — Z952 Presence of prosthetic heart valve: Secondary | ICD-10-CM | POA: Diagnosis not present

## 2022-12-27 DIAGNOSIS — Z452 Encounter for adjustment and management of vascular access device: Secondary | ICD-10-CM | POA: Diagnosis not present

## 2022-12-27 DIAGNOSIS — E119 Type 2 diabetes mellitus without complications: Secondary | ICD-10-CM | POA: Diagnosis present

## 2022-12-27 DIAGNOSIS — D62 Acute posthemorrhagic anemia: Secondary | ICD-10-CM | POA: Diagnosis not present

## 2022-12-27 DIAGNOSIS — I712 Thoracic aortic aneurysm, without rupture, unspecified: Principal | ICD-10-CM | POA: Diagnosis present

## 2022-12-27 DIAGNOSIS — Z6839 Body mass index (BMI) 39.0-39.9, adult: Secondary | ICD-10-CM

## 2022-12-27 DIAGNOSIS — Z01818 Encounter for other preprocedural examination: Secondary | ICD-10-CM

## 2022-12-27 DIAGNOSIS — Z8249 Family history of ischemic heart disease and other diseases of the circulatory system: Secondary | ICD-10-CM

## 2022-12-27 DIAGNOSIS — Y713 Surgical instruments, materials and cardiovascular devices (including sutures) associated with adverse incidents: Secondary | ICD-10-CM | POA: Diagnosis not present

## 2022-12-27 DIAGNOSIS — N529 Male erectile dysfunction, unspecified: Secondary | ICD-10-CM | POA: Diagnosis present

## 2022-12-27 DIAGNOSIS — E669 Obesity, unspecified: Secondary | ICD-10-CM | POA: Diagnosis not present

## 2022-12-27 DIAGNOSIS — I719 Aortic aneurysm of unspecified site, without rupture: Secondary | ICD-10-CM | POA: Diagnosis not present

## 2022-12-27 DIAGNOSIS — E7800 Pure hypercholesterolemia, unspecified: Secondary | ICD-10-CM | POA: Diagnosis present

## 2022-12-27 DIAGNOSIS — I314 Cardiac tamponade: Secondary | ICD-10-CM | POA: Diagnosis not present

## 2022-12-27 DIAGNOSIS — I714 Abdominal aortic aneurysm, without rupture, unspecified: Secondary | ICD-10-CM | POA: Diagnosis not present

## 2022-12-27 DIAGNOSIS — E8770 Fluid overload, unspecified: Secondary | ICD-10-CM | POA: Diagnosis not present

## 2022-12-27 DIAGNOSIS — Q2543 Congenital aneurysm of aorta: Secondary | ICD-10-CM | POA: Diagnosis not present

## 2022-12-27 DIAGNOSIS — G4733 Obstructive sleep apnea (adult) (pediatric): Secondary | ICD-10-CM | POA: Diagnosis present

## 2022-12-27 DIAGNOSIS — Z23 Encounter for immunization: Secondary | ICD-10-CM | POA: Diagnosis not present

## 2022-12-27 DIAGNOSIS — Z800 Family history of malignant neoplasm of digestive organs: Secondary | ICD-10-CM

## 2022-12-27 DIAGNOSIS — E785 Hyperlipidemia, unspecified: Secondary | ICD-10-CM | POA: Diagnosis present

## 2022-12-27 DIAGNOSIS — J90 Pleural effusion, not elsewhere classified: Secondary | ICD-10-CM | POA: Diagnosis not present

## 2022-12-27 DIAGNOSIS — R918 Other nonspecific abnormal finding of lung field: Secondary | ICD-10-CM | POA: Diagnosis not present

## 2022-12-27 DIAGNOSIS — Q231 Congenital insufficiency of aortic valve: Secondary | ICD-10-CM

## 2022-12-27 DIAGNOSIS — I351 Nonrheumatic aortic (valve) insufficiency: Secondary | ICD-10-CM

## 2022-12-27 DIAGNOSIS — Z4682 Encounter for fitting and adjustment of non-vascular catheter: Secondary | ICD-10-CM | POA: Diagnosis not present

## 2022-12-27 DIAGNOSIS — I10 Essential (primary) hypertension: Secondary | ICD-10-CM | POA: Diagnosis not present

## 2022-12-27 DIAGNOSIS — G4730 Sleep apnea, unspecified: Secondary | ICD-10-CM | POA: Diagnosis not present

## 2022-12-27 DIAGNOSIS — Z7901 Long term (current) use of anticoagulants: Secondary | ICD-10-CM | POA: Diagnosis not present

## 2022-12-27 DIAGNOSIS — I7121 Aneurysm of the ascending aorta, without rupture: Secondary | ICD-10-CM | POA: Diagnosis not present

## 2022-12-27 DIAGNOSIS — K3184 Gastroparesis: Secondary | ICD-10-CM | POA: Diagnosis not present

## 2022-12-27 DIAGNOSIS — E1143 Type 2 diabetes mellitus with diabetic autonomic (poly)neuropathy: Secondary | ICD-10-CM | POA: Diagnosis not present

## 2022-12-27 DIAGNOSIS — I9789 Other postprocedural complications and disorders of the circulatory system, not elsewhere classified: Secondary | ICD-10-CM | POA: Diagnosis not present

## 2022-12-27 DIAGNOSIS — Z6837 Body mass index (BMI) 37.0-37.9, adult: Secondary | ICD-10-CM | POA: Diagnosis not present

## 2022-12-27 DIAGNOSIS — D72829 Elevated white blood cell count, unspecified: Secondary | ICD-10-CM | POA: Diagnosis not present

## 2022-12-27 DIAGNOSIS — I083 Combined rheumatic disorders of mitral, aortic and tricuspid valves: Secondary | ICD-10-CM | POA: Diagnosis not present

## 2022-12-27 DIAGNOSIS — G43109 Migraine with aura, not intractable, without status migrainosus: Secondary | ICD-10-CM | POA: Diagnosis present

## 2022-12-27 DIAGNOSIS — I358 Other nonrheumatic aortic valve disorders: Secondary | ICD-10-CM | POA: Diagnosis not present

## 2022-12-27 DIAGNOSIS — R112 Nausea with vomiting, unspecified: Secondary | ICD-10-CM | POA: Diagnosis not present

## 2022-12-27 DIAGNOSIS — I517 Cardiomegaly: Secondary | ICD-10-CM | POA: Diagnosis not present

## 2022-12-27 DIAGNOSIS — G43909 Migraine, unspecified, not intractable, without status migrainosus: Secondary | ICD-10-CM | POA: Diagnosis present

## 2022-12-27 DIAGNOSIS — Z794 Long term (current) use of insulin: Secondary | ICD-10-CM

## 2022-12-27 HISTORY — PX: THORACIC AORTIC ANEURYSM REPAIR: SHX799

## 2022-12-27 HISTORY — PX: BENTALL PROCEDURE: SHX5058

## 2022-12-27 HISTORY — PX: TEE WITHOUT CARDIOVERSION: SHX5443

## 2022-12-27 LAB — POCT I-STAT, CHEM 8
BUN: 18 mg/dL (ref 6–20)
BUN: 19 mg/dL (ref 6–20)
BUN: 18 mg/dL (ref 6–20)
BUN: 18 mg/dL (ref 6–20)
BUN: 19 mg/dL (ref 6–20)
BUN: 18 mg/dL (ref 6–20)
Calcium, Ion: 1.18 mmol/L (ref 1.15–1.40)
Calcium, Ion: 1.17 mmol/L (ref 1.15–1.40)
Calcium, Ion: 1.08 mmol/L — ABNORMAL LOW (ref 1.15–1.40)
Calcium, Ion: 1.12 mmol/L — ABNORMAL LOW (ref 1.15–1.40)
Calcium, Ion: 1.07 mmol/L — ABNORMAL LOW (ref 1.15–1.40)
Calcium, Ion: 1.05 mmol/L — ABNORMAL LOW (ref 1.15–1.40)
Chloride: 104 mmol/L (ref 98–111)
Chloride: 103 mmol/L (ref 98–111)
Chloride: 103 mmol/L (ref 98–111)
Chloride: 103 mmol/L (ref 98–111)
Chloride: 104 mmol/L (ref 98–111)
Chloride: 104 mmol/L (ref 98–111)
Creatinine, Ser: 0.6 mg/dL — ABNORMAL LOW (ref 0.61–1.24)
Creatinine, Ser: 0.7 mg/dL (ref 0.61–1.24)
Creatinine, Ser: 0.8 mg/dL (ref 0.61–1.24)
Creatinine, Ser: 0.8 mg/dL (ref 0.61–1.24)
Creatinine, Ser: 0.7 mg/dL (ref 0.61–1.24)
Creatinine, Ser: 0.7 mg/dL (ref 0.61–1.24)
Glucose, Bld: 144 mg/dL — ABNORMAL HIGH (ref 70–99)
Glucose, Bld: 135 mg/dL — ABNORMAL HIGH (ref 70–99)
Glucose, Bld: 137 mg/dL — ABNORMAL HIGH (ref 70–99)
Glucose, Bld: 117 mg/dL — ABNORMAL HIGH (ref 70–99)
Glucose, Bld: 125 mg/dL — ABNORMAL HIGH (ref 70–99)
Glucose, Bld: 134 mg/dL — ABNORMAL HIGH (ref 70–99)
HCT: 34 % — ABNORMAL LOW (ref 39.0–52.0)
HCT: 31 % — ABNORMAL LOW (ref 39.0–52.0)
HCT: 28 % — ABNORMAL LOW (ref 39.0–52.0)
HCT: 28 % — ABNORMAL LOW (ref 39.0–52.0)
HCT: 28 % — ABNORMAL LOW (ref 39.0–52.0)
HCT: 28 % — ABNORMAL LOW (ref 39.0–52.0)
Hemoglobin: 11.6 g/dL — ABNORMAL LOW (ref 13.0–17.0)
Hemoglobin: 10.5 g/dL — ABNORMAL LOW (ref 13.0–17.0)
Hemoglobin: 9.5 g/dL — ABNORMAL LOW (ref 13.0–17.0)
Hemoglobin: 9.5 g/dL — ABNORMAL LOW (ref 13.0–17.0)
Hemoglobin: 9.5 g/dL — ABNORMAL LOW (ref 13.0–17.0)
Hemoglobin: 9.5 g/dL — ABNORMAL LOW (ref 13.0–17.0)
Potassium: 4 mmol/L (ref 3.5–5.1)
Potassium: 4.3 mmol/L (ref 3.5–5.1)
Potassium: 3.6 mmol/L (ref 3.5–5.1)
Potassium: 3.7 mmol/L (ref 3.5–5.1)
Potassium: 5.1 mmol/L (ref 3.5–5.1)
Potassium: 4.5 mmol/L (ref 3.5–5.1)
Sodium: 139 mmol/L (ref 135–145)
Sodium: 140 mmol/L (ref 135–145)
Sodium: 137 mmol/L (ref 135–145)
Sodium: 140 mmol/L (ref 135–145)
Sodium: 137 mmol/L (ref 135–145)
Sodium: 138 mmol/L (ref 135–145)
TCO2: 24 mmol/L (ref 22–32)
TCO2: 26 mmol/L (ref 22–32)
TCO2: 26 mmol/L (ref 22–32)
TCO2: 27 mmol/L (ref 22–32)
TCO2: 24 mmol/L (ref 22–32)
TCO2: 23 mmol/L (ref 22–32)

## 2022-12-27 LAB — POCT I-STAT 7, (LYTES, BLD GAS, ICA,H+H)
Acid-base deficit: 3 mmol/L — ABNORMAL HIGH (ref 0.0–2.0)
Acid-base deficit: 1 mmol/L (ref 0.0–2.0)
Acid-base deficit: 3 mmol/L — ABNORMAL HIGH (ref 0.0–2.0)
Acid-base deficit: 3 mmol/L — ABNORMAL HIGH (ref 0.0–2.0)
Acid-base deficit: 2 mmol/L (ref 0.0–2.0)
Acid-base deficit: 4 mmol/L — ABNORMAL HIGH (ref 0.0–2.0)
Acid-base deficit: 4 mmol/L — ABNORMAL HIGH (ref 0.0–2.0)
Acid-base deficit: 3 mmol/L — ABNORMAL HIGH (ref 0.0–2.0)
Bicarbonate: 22.8 mmol/L (ref 20.0–28.0)
Bicarbonate: 24 mmol/L (ref 20.0–28.0)
Bicarbonate: 24.3 mmol/L (ref 20.0–28.0)
Bicarbonate: 23.9 mmol/L (ref 20.0–28.0)
Bicarbonate: 22.1 mmol/L (ref 20.0–28.0)
Bicarbonate: 22.8 mmol/L (ref 20.0–28.0)
Bicarbonate: 21.5 mmol/L (ref 20.0–28.0)
Bicarbonate: 22.5 mmol/L (ref 20.0–28.0)
Calcium, Ion: 1.23 mmol/L (ref 1.15–1.40)
Calcium, Ion: 1.06 mmol/L — ABNORMAL LOW (ref 1.15–1.40)
Calcium, Ion: 1.12 mmol/L — ABNORMAL LOW (ref 1.15–1.40)
Calcium, Ion: 1.14 mmol/L — ABNORMAL LOW (ref 1.15–1.40)
Calcium, Ion: 1.08 mmol/L — ABNORMAL LOW (ref 1.15–1.40)
Calcium, Ion: 1.14 mmol/L — ABNORMAL LOW (ref 1.15–1.40)
Calcium, Ion: 1.13 mmol/L — ABNORMAL LOW (ref 1.15–1.40)
Calcium, Ion: 1.16 mmol/L (ref 1.15–1.40)
HCT: 33 % — ABNORMAL LOW (ref 39.0–52.0)
HCT: 26 % — ABNORMAL LOW (ref 39.0–52.0)
HCT: 27 % — ABNORMAL LOW (ref 39.0–52.0)
HCT: 28 % — ABNORMAL LOW (ref 39.0–52.0)
HCT: 28 % — ABNORMAL LOW (ref 39.0–52.0)
HCT: 28 % — ABNORMAL LOW (ref 39.0–52.0)
HCT: 30 % — ABNORMAL LOW (ref 39.0–52.0)
HCT: 28 % — ABNORMAL LOW (ref 39.0–52.0)
Hemoglobin: 11.2 g/dL — ABNORMAL LOW (ref 13.0–17.0)
Hemoglobin: 8.8 g/dL — ABNORMAL LOW (ref 13.0–17.0)
Hemoglobin: 9.2 g/dL — ABNORMAL LOW (ref 13.0–17.0)
Hemoglobin: 9.5 g/dL — ABNORMAL LOW (ref 13.0–17.0)
Hemoglobin: 9.5 g/dL — ABNORMAL LOW (ref 13.0–17.0)
Hemoglobin: 9.5 g/dL — ABNORMAL LOW (ref 13.0–17.0)
Hemoglobin: 10.2 g/dL — ABNORMAL LOW (ref 13.0–17.0)
Hemoglobin: 9.5 g/dL — ABNORMAL LOW (ref 13.0–17.0)
O2 Saturation: 100 %
O2 Saturation: 100 %
O2 Saturation: 100 %
O2 Saturation: 100 %
O2 Saturation: 100 %
O2 Saturation: 97 %
O2 Saturation: 100 %
O2 Saturation: 99 %
Patient temperature: 35.2
Patient temperature: 37
Patient temperature: 36.9
Potassium: 4 mmol/L (ref 3.5–5.1)
Potassium: 3.8 mmol/L (ref 3.5–5.1)
Potassium: 3.7 mmol/L (ref 3.5–5.1)
Potassium: 3.8 mmol/L (ref 3.5–5.1)
Potassium: 4.5 mmol/L (ref 3.5–5.1)
Potassium: 3.4 mmol/L — ABNORMAL LOW (ref 3.5–5.1)
Potassium: 4.2 mmol/L (ref 3.5–5.1)
Potassium: 4.1 mmol/L (ref 3.5–5.1)
Sodium: 139 mmol/L (ref 135–145)
Sodium: 139 mmol/L (ref 135–145)
Sodium: 137 mmol/L (ref 135–145)
Sodium: 138 mmol/L (ref 135–145)
Sodium: 138 mmol/L (ref 135–145)
Sodium: 142 mmol/L (ref 135–145)
Sodium: 142 mmol/L (ref 135–145)
Sodium: 143 mmol/L (ref 135–145)
TCO2: 24 mmol/L (ref 22–32)
TCO2: 25 mmol/L (ref 22–32)
TCO2: 26 mmol/L (ref 22–32)
TCO2: 25 mmol/L (ref 22–32)
TCO2: 23 mmol/L (ref 22–32)
TCO2: 24 mmol/L (ref 22–32)
TCO2: 23 mmol/L (ref 22–32)
TCO2: 24 mmol/L (ref 22–32)
pCO2 arterial: 43.1 mmHg (ref 32–48)
pCO2 arterial: 38.5 mmHg (ref 32–48)
pCO2 arterial: 58.2 mmHg — ABNORMAL HIGH (ref 32–48)
pCO2 arterial: 50.3 mmHg — ABNORMAL HIGH (ref 32–48)
pCO2 arterial: 34.9 mmHg (ref 32–48)
pCO2 arterial: 44 mmHg (ref 32–48)
pCO2 arterial: 37.7 mmHg (ref 32–48)
pCO2 arterial: 42.2 mmHg (ref 32–48)
pH, Arterial: 7.331 — ABNORMAL LOW (ref 7.35–7.45)
pH, Arterial: 7.403 (ref 7.35–7.45)
pH, Arterial: 7.228 — ABNORMAL LOW (ref 7.35–7.45)
pH, Arterial: 7.286 — ABNORMAL LOW (ref 7.35–7.45)
pH, Arterial: 7.41 (ref 7.35–7.45)
pH, Arterial: 7.313 — ABNORMAL LOW (ref 7.35–7.45)
pH, Arterial: 7.364 (ref 7.35–7.45)
pH, Arterial: 7.334 — ABNORMAL LOW (ref 7.35–7.45)
pO2, Arterial: 443 mmHg — ABNORMAL HIGH (ref 83–108)
pO2, Arterial: 409 mmHg — ABNORMAL HIGH (ref 83–108)
pO2, Arterial: 377 mmHg — ABNORMAL HIGH (ref 83–108)
pO2, Arterial: 397 mmHg — ABNORMAL HIGH (ref 83–108)
pO2, Arterial: 329 mmHg — ABNORMAL HIGH (ref 83–108)
pO2, Arterial: 92 mmHg (ref 83–108)
pO2, Arterial: 172 mmHg — ABNORMAL HIGH (ref 83–108)
pO2, Arterial: 137 mmHg — ABNORMAL HIGH (ref 83–108)

## 2022-12-27 LAB — BASIC METABOLIC PANEL
Anion gap: 10 (ref 5–15)
BUN: 17 mg/dL (ref 6–20)
CO2: 22 mmol/L (ref 22–32)
Calcium: 7.7 mg/dL — ABNORMAL LOW (ref 8.9–10.3)
Chloride: 108 mmol/L (ref 98–111)
Creatinine, Ser: 0.9 mg/dL (ref 0.61–1.24)
GFR, Estimated: >60 mL/min (ref >60)
Glucose, Bld: 128 mg/dL — ABNORMAL HIGH (ref 70–99)
Potassium: 4.1 mmol/L (ref 3.5–5.1)
Sodium: 140 mmol/L (ref 135–145)

## 2022-12-27 LAB — APTT: aPTT: 32 seconds (ref 24–36)

## 2022-12-27 LAB — POCT I-STAT EG7
Acid-base deficit: 1 mmol/L (ref 0.0–2.0)
Bicarbonate: 24.8 mmol/L (ref 20.0–28.0)
Calcium, Ion: 1.11 mmol/L — ABNORMAL LOW (ref 1.15–1.40)
HCT: 27 % — ABNORMAL LOW (ref 39.0–52.0)
Hemoglobin: 9.2 g/dL — ABNORMAL LOW (ref 13.0–17.0)
O2 Saturation: 88 %
Potassium: 3.6 mmol/L (ref 3.5–5.1)
Sodium: 140 mmol/L (ref 135–145)
TCO2: 26 mmol/L (ref 22–32)
pCO2, Ven: 44.4 mmHg (ref 44–60)
pH, Ven: 7.355 (ref 7.25–7.43)
pO2, Ven: 58 mmHg — ABNORMAL HIGH (ref 32–45)

## 2022-12-27 LAB — ABO/RH: ABO/RH(D): A POS

## 2022-12-27 LAB — CBC
HCT: 30.7 % — ABNORMAL LOW (ref 39.0–52.0)
HCT: 30.4 % — ABNORMAL LOW (ref 39.0–52.0)
Hemoglobin: 10.6 g/dL — ABNORMAL LOW (ref 13.0–17.0)
Hemoglobin: 10.6 g/dL — ABNORMAL LOW (ref 13.0–17.0)
MCH: 31.7 pg (ref 26.0–34.0)
MCH: 31.5 pg (ref 26.0–34.0)
MCHC: 34.5 g/dL (ref 30.0–36.0)
MCHC: 34.9 g/dL (ref 30.0–36.0)
MCV: 91.9 fL (ref 80.0–100.0)
MCV: 90.5 fL (ref 80.0–100.0)
Platelets: 144 10*3/uL — ABNORMAL LOW (ref 150–400)
Platelets: 194 10*3/uL (ref 150–400)
RBC: 3.34 MIL/uL — ABNORMAL LOW (ref 4.22–5.81)
RBC: 3.36 MIL/uL — ABNORMAL LOW (ref 4.22–5.81)
RDW: 12.5 % (ref 11.5–15.5)
RDW: 12.4 % (ref 11.5–15.5)
WBC: 10.3 10*3/uL (ref 4.0–10.5)
WBC: 14.7 10*3/uL — ABNORMAL HIGH (ref 4.0–10.5)
nRBC: 0 % (ref 0.0–0.2)
nRBC: 0 % (ref 0.0–0.2)

## 2022-12-27 LAB — PROTIME-INR
INR: 1.6 — ABNORMAL HIGH (ref 0.8–1.2)
Prothrombin Time: 18.4 seconds — ABNORMAL HIGH (ref 11.4–15.2)

## 2022-12-27 LAB — PLATELET COUNT: Platelets: 142 10*3/uL — ABNORMAL LOW (ref 150–400)

## 2022-12-27 LAB — ECHO INTRAOPERATIVE TEE
Height: 67 in
Weight: 3920 oz

## 2022-12-27 LAB — GLUCOSE, CAPILLARY
Glucose-Capillary: 105 mg/dL — ABNORMAL HIGH (ref 70–99)
Glucose-Capillary: 109 mg/dL — ABNORMAL HIGH (ref 70–99)
Glucose-Capillary: 133 mg/dL — ABNORMAL HIGH (ref 70–99)
Glucose-Capillary: 134 mg/dL — ABNORMAL HIGH (ref 70–99)
Glucose-Capillary: 135 mg/dL — ABNORMAL HIGH (ref 70–99)
Glucose-Capillary: 136 mg/dL — ABNORMAL HIGH (ref 70–99)
Glucose-Capillary: 148 mg/dL — ABNORMAL HIGH (ref 70–99)
Glucose-Capillary: 151 mg/dL — ABNORMAL HIGH (ref 70–99)
Glucose-Capillary: 172 mg/dL — ABNORMAL HIGH (ref 70–99)
Glucose-Capillary: 89 mg/dL (ref 70–99)

## 2022-12-27 LAB — MAGNESIUM: Magnesium: 2.9 mg/dL — ABNORMAL HIGH (ref 1.7–2.4)

## 2022-12-27 LAB — FIBRINOGEN: Fibrinogen: 170 mg/dL — ABNORMAL LOW (ref 210–475)

## 2022-12-27 LAB — HEMOGLOBIN AND HEMATOCRIT, BLOOD
HCT: 28.4 % — ABNORMAL LOW (ref 39.0–52.0)
Hemoglobin: 9.8 g/dL — ABNORMAL LOW (ref 13.0–17.0)

## 2022-12-27 LAB — PREPARE RBC (CROSSMATCH)

## 2022-12-27 SURGERY — BENTALL PROCEDURE
Anesthesia: General | Site: Chest

## 2022-12-27 MED ORDER — LACTATED RINGERS IV SOLN: INTRAVENOUS | Status: DC | PRN

## 2022-12-27 MED ORDER — FENTANYL CITRATE (PF) 250 MCG/5ML IJ SOLN
INTRAMUSCULAR | Status: AC
  Filled 2022-12-27: qty 5

## 2022-12-27 MED ORDER — PROPOFOL 10 MG/ML IV BOLUS
INTRAVENOUS | Status: AC
  Filled 2022-12-27: qty 20

## 2022-12-27 MED ORDER — PROTAMINE SULFATE 10 MG/ML IV SOLN
INTRAVENOUS | Status: DC | PRN
  Administered 2022-12-27: 320 mg via INTRAVENOUS
  Administered 2022-12-27: 30 mg via INTRAVENOUS

## 2022-12-27 MED ORDER — FENOFIBRATE 160 MG PO TABS
160.0000 mg | ORAL_TABLET | Freq: Every morning | ORAL | Status: DC
  Administered 2022-12-28 – 2023-01-01 (×5): 160 mg via ORAL
  Filled 2022-12-27 (×7): qty 1

## 2022-12-27 MED ORDER — SODIUM CHLORIDE 0.9 % IV SOLN: INTRAVENOUS | Status: DC | PRN

## 2022-12-27 MED ORDER — CHLORHEXIDINE GLUCONATE CLOTH 2 % EX PADS: 6.0000 | MEDICATED_PAD | Freq: Every day | CUTANEOUS | Status: DC

## 2022-12-27 MED ORDER — PHENYLEPHRINE HCL-NACL 20-0.9 MG/250ML-% IV SOLN: 0.0000 ug/min | INTRAVENOUS | Status: DC

## 2022-12-27 MED ORDER — PROPOFOL 10 MG/ML IV BOLUS
INTRAVENOUS | Status: DC | PRN
  Administered 2022-12-27 (×2): 50 mg via INTRAVENOUS
  Administered 2022-12-27: 30 mg via INTRAVENOUS
  Administered 2022-12-27: 50 mg via INTRAVENOUS
  Administered 2022-12-27: 100 mg via INTRAVENOUS

## 2022-12-27 MED ORDER — DEXMEDETOMIDINE HCL IN NACL 400 MCG/100ML IV SOLN: 0.0000 ug/kg/h | INTRAVENOUS | Status: DC

## 2022-12-27 MED ORDER — DOCUSATE SODIUM 100 MG PO CAPS
200.0000 mg | ORAL_CAPSULE | Freq: Every day | ORAL | Status: DC
  Administered 2022-12-28 – 2022-12-30 (×3): 200 mg via ORAL
  Filled 2022-12-27 (×3): qty 2

## 2022-12-27 MED ORDER — SODIUM CHLORIDE 0.9% FLUSH
3.0000 mL | Freq: Two times a day (BID) | INTRAVENOUS | Status: DC
  Administered 2022-12-28 – 2022-12-30 (×4): 3 mL via INTRAVENOUS

## 2022-12-27 MED ORDER — ATORVASTATIN CALCIUM 40 MG PO TABS
40.0000 mg | ORAL_TABLET | Freq: Every day | ORAL | Status: DC
  Administered 2022-12-28 – 2022-12-31 (×4): 40 mg via ORAL
  Filled 2022-12-27 (×4): qty 1

## 2022-12-27 MED ORDER — 0.9 % SODIUM CHLORIDE (POUR BTL) OPTIME
TOPICAL | Status: DC | PRN
  Administered 2022-12-27: 5000 mL

## 2022-12-27 MED ORDER — DEXMEDETOMIDINE HCL IN NACL 200 MCG/50ML IV SOLN
INTRAVENOUS | Status: AC
  Filled 2022-12-27: qty 50

## 2022-12-27 MED ORDER — ORAL CARE MOUTH RINSE
15.0000 mL | OROMUCOSAL | Status: DC
  Administered 2022-12-27 – 2022-12-28 (×7): 15 mL via OROMUCOSAL

## 2022-12-27 MED ORDER — PLASMA-LYTE A IV SOLN: INTRAVENOUS | Status: DC | PRN

## 2022-12-27 MED ORDER — TRAMADOL HCL 50 MG PO TABS
50.0000 mg | ORAL_TABLET | ORAL | Status: DC | PRN
  Administered 2022-12-28 – 2022-12-31 (×2): 100 mg via ORAL
  Filled 2022-12-27 (×2): qty 2

## 2022-12-27 MED ORDER — ASPIRIN 81 MG PO CHEW: 324.0000 mg | CHEWABLE_TABLET | Freq: Every day | ORAL | Status: DC

## 2022-12-27 MED ORDER — SODIUM CHLORIDE 0.9% FLUSH: 10.0000 mL | INTRAVENOUS | Status: DC | PRN

## 2022-12-27 MED ORDER — INSULIN REGULAR(HUMAN) IN NACL 100-0.9 UT/100ML-% IV SOLN
INTRAVENOUS | Status: DC | PRN
  Administered 2022-12-27: 4.2 [IU]/h via INTRAVENOUS

## 2022-12-27 MED ORDER — ACETAMINOPHEN 650 MG RE SUPP: 650.0000 mg | Freq: Once | RECTAL | Status: DC

## 2022-12-27 MED ORDER — THROMBIN 20000 UNITS EX SOLR
CUTANEOUS | Status: DC | PRN
  Administered 2022-12-27: 20000 [IU] via TOPICAL

## 2022-12-27 MED ORDER — MIDAZOLAM HCL (PF) 5 MG/ML IJ SOLN
INTRAMUSCULAR | Status: DC | PRN
  Administered 2022-12-27: 2 mg via INTRAVENOUS
  Administered 2022-12-27: 1 mg via INTRAVENOUS
  Administered 2022-12-27: 2 mg via INTRAVENOUS

## 2022-12-27 MED ORDER — BISACODYL 5 MG PO TBEC
10.0000 mg | DELAYED_RELEASE_TABLET | Freq: Every day | ORAL | Status: DC
  Administered 2022-12-28 – 2022-12-30 (×3): 10 mg via ORAL
  Filled 2022-12-27 (×3): qty 2

## 2022-12-27 MED ORDER — HEMOSTATIC AGENTS (NO CHARGE) OPTIME
TOPICAL | Status: DC | PRN
  Administered 2022-12-27 (×2): 1 via TOPICAL

## 2022-12-27 MED ORDER — DEXTROSE 50 % IV SOLN: 0.0000 mL | INTRAVENOUS | Status: DC | PRN

## 2022-12-27 MED ORDER — FENTANYL CITRATE (PF) 100 MCG/2ML IJ SOLN
INTRAMUSCULAR | Status: DC | PRN
  Administered 2022-12-27: 150 ug via INTRAVENOUS
  Administered 2022-12-27: 50 ug via INTRAVENOUS
  Administered 2022-12-27: 150 ug via INTRAVENOUS
  Administered 2022-12-27: 200 ug via INTRAVENOUS
  Administered 2022-12-27: 150 ug via INTRAVENOUS
  Administered 2022-12-27: 50 ug via INTRAVENOUS
  Administered 2022-12-27: 150 ug via INTRAVENOUS
  Administered 2022-12-27: 100 ug via INTRAVENOUS

## 2022-12-27 MED ORDER — SODIUM CHLORIDE 0.9% FLUSH
10.0000 mL | Freq: Two times a day (BID) | INTRAVENOUS | Status: DC
  Administered 2022-12-27 – 2022-12-29 (×3): 10 mL

## 2022-12-27 MED ORDER — ~~LOC~~ CARDIAC SURGERY, PATIENT & FAMILY EDUCATION
Freq: Once | Status: DC
  Filled 2022-12-27: qty 1

## 2022-12-27 MED ORDER — DEXMEDETOMIDINE HCL IN NACL 400 MCG/100ML IV SOLN
INTRAVENOUS | Status: AC
  Filled 2022-12-27: qty 100

## 2022-12-27 MED ORDER — PANCRELIPASE (LIP-PROT-AMYL) 12000-38000 UNITS PO CPEP
72000.0000 [IU] | ORAL_CAPSULE | Freq: Three times a day (TID) | ORAL | Status: DC
  Administered 2022-12-29 – 2022-12-31 (×9): 72000 [IU] via ORAL
  Filled 2022-12-27: qty 6
  Filled 2022-12-27 (×11): qty 2
  Filled 2022-12-27: qty 6
  Filled 2022-12-27: qty 2

## 2022-12-27 MED ORDER — METOPROLOL TARTRATE 12.5 MG HALF TABLET: 12.5000 mg | ORAL_TABLET | Freq: Two times a day (BID) | ORAL | Status: DC

## 2022-12-27 MED ORDER — SODIUM CHLORIDE 0.9 % IV SOLN: 250.0000 mL | INTRAVENOUS | Status: DC

## 2022-12-27 MED ORDER — PROTAMINE SULFATE 10 MG/ML IV SOLN
INTRAVENOUS | Status: AC
  Filled 2022-12-27: qty 25

## 2022-12-27 MED ORDER — LACTATED RINGERS IV SOLN: 500.0000 mL | Freq: Once | INTRAVENOUS | Status: DC | PRN

## 2022-12-27 MED ORDER — MIDAZOLAM HCL (PF) 10 MG/2ML IJ SOLN
INTRAMUSCULAR | Status: AC
  Filled 2022-12-27: qty 2

## 2022-12-27 MED ORDER — METOPROLOL TARTRATE 12.5 MG HALF TABLET
12.5000 mg | ORAL_TABLET | Freq: Once | ORAL | Status: DC
  Filled 2022-12-27: qty 1

## 2022-12-27 MED ORDER — CEFAZOLIN SODIUM-DEXTROSE 2-4 GM/100ML-% IV SOLN
2.0000 g | Freq: Three times a day (TID) | INTRAVENOUS | Status: AC
  Administered 2022-12-27 – 2022-12-29 (×6): 2 g via INTRAVENOUS
  Filled 2022-12-27 (×4): qty 100

## 2022-12-27 MED ORDER — INSULIN ASPART 100 UNIT/ML IJ SOLN: 0.0000 [IU] | INTRAMUSCULAR | Status: DC | PRN

## 2022-12-27 MED ORDER — CHLORHEXIDINE GLUCONATE 4 % EX LIQD: 30.0000 mL | CUTANEOUS | Status: DC

## 2022-12-27 MED ORDER — LACTATED RINGERS IV SOLN: INTRAVENOUS | Status: DC

## 2022-12-27 MED ORDER — HEPARIN SODIUM (PORCINE) 1000 UNIT/ML IJ SOLN
INTRAMUSCULAR | Status: DC | PRN
  Administered 2022-12-27: 39000 [IU] via INTRAVENOUS

## 2022-12-27 MED ORDER — PANTOPRAZOLE SODIUM 40 MG PO TBEC
40.0000 mg | DELAYED_RELEASE_TABLET | Freq: Every day | ORAL | Status: DC
  Administered 2022-12-29 – 2023-01-01 (×4): 40 mg via ORAL
  Filled 2022-12-27 (×4): qty 1

## 2022-12-27 MED ORDER — CHLORHEXIDINE GLUCONATE 0.12 % MT SOLN
15.0000 mL | Freq: Once | OROMUCOSAL | Status: AC
  Administered 2022-12-27: 15 mL via OROMUCOSAL
  Filled 2022-12-27: qty 15

## 2022-12-27 MED ORDER — FAMOTIDINE IN NACL 20-0.9 MG/50ML-% IV SOLN
20.0000 mg | Freq: Two times a day (BID) | INTRAVENOUS | Status: DC
  Administered 2022-12-27: 20 mg via INTRAVENOUS
  Filled 2022-12-27: qty 50

## 2022-12-27 MED ORDER — ROCURONIUM BROMIDE 100 MG/10ML IV SOLN
INTRAVENOUS | Status: DC | PRN
  Administered 2022-12-27: 50 mg via INTRAVENOUS
  Administered 2022-12-27: 100 mg via INTRAVENOUS
  Administered 2022-12-27: 50 mg via INTRAVENOUS
  Administered 2022-12-27: 30 mg via INTRAVENOUS
  Administered 2022-12-27: 50 mg via INTRAVENOUS

## 2022-12-27 MED ORDER — TRANEXAMIC ACID-NACL 1000-0.7 MG/100ML-% IV SOLN
INTRAVENOUS | Status: AC
  Filled 2022-12-27: qty 100

## 2022-12-27 MED ORDER — METHYLPREDNISOLONE SODIUM SUCC 125 MG IJ SOLR
INTRAMUSCULAR | Status: DC | PRN
  Administered 2022-12-27: 125 mg via INTRAVENOUS

## 2022-12-27 MED ORDER — SODIUM CHLORIDE 0.9 % IV SOLN: INTRAVENOUS | Status: DC

## 2022-12-27 MED ORDER — ACETAMINOPHEN 160 MG/5ML PO SOLN: 1000.0000 mg | Freq: Four times a day (QID) | ORAL | Status: DC

## 2022-12-27 MED ORDER — MIDAZOLAM HCL 2 MG/2ML IJ SOLN: 2.0000 mg | INTRAMUSCULAR | Status: DC | PRN

## 2022-12-27 MED ORDER — POTASSIUM CHLORIDE 10 MEQ/50ML IV SOLN
10.0000 meq | INTRAVENOUS | Status: AC
  Administered 2022-12-27 (×3): 10 meq via INTRAVENOUS

## 2022-12-27 MED ORDER — ALBUMIN HUMAN 5 % IV SOLN
250.0000 mL | INTRAVENOUS | Status: DC | PRN
  Administered 2022-12-27 (×2): 12.5 g via INTRAVENOUS

## 2022-12-27 MED ORDER — HYDROCORTISONE SOD SUC (PF) 100 MG IJ SOLR: INTRAMUSCULAR | Status: DC | PRN

## 2022-12-27 MED ORDER — SODIUM CHLORIDE 0.9% FLUSH: 3.0000 mL | INTRAVENOUS | Status: DC | PRN

## 2022-12-27 MED ORDER — NITROGLYCERIN IN D5W 200-5 MCG/ML-% IV SOLN
0.0000 ug/min | INTRAVENOUS | Status: DC
  Administered 2022-12-27: 5 ug/min via INTRAVENOUS
  Filled 2022-12-27: qty 250

## 2022-12-27 MED ORDER — SODIUM CHLORIDE (PF) 0.9 % IJ SOLN: OROMUCOSAL | Status: DC | PRN

## 2022-12-27 MED ORDER — METOPROLOL TARTRATE 25 MG/10 ML ORAL SUSPENSION: 12.5000 mg | Freq: Two times a day (BID) | ORAL | Status: DC

## 2022-12-27 MED ORDER — INSULIN REGULAR(HUMAN) IN NACL 100-0.9 UT/100ML-% IV SOLN: INTRAVENOUS | Status: DC

## 2022-12-27 MED ORDER — CHLORHEXIDINE GLUCONATE CLOTH 2 % EX PADS
6.0000 | MEDICATED_PAD | Freq: Every day | CUTANEOUS | Status: DC
  Administered 2022-12-27 – 2022-12-30 (×4): 6 via TOPICAL

## 2022-12-27 MED ORDER — MAGNESIUM SULFATE 4 GM/100ML IV SOLN
4.0000 g | Freq: Once | INTRAVENOUS | Status: AC
  Administered 2022-12-27: 4 g via INTRAVENOUS
  Filled 2022-12-27: qty 100

## 2022-12-27 MED ORDER — METOPROLOL TARTRATE 5 MG/5ML IV SOLN: 2.5000 mg | INTRAVENOUS | Status: DC | PRN

## 2022-12-27 MED ORDER — OXYCODONE HCL 5 MG PO TABS
5.0000 mg | ORAL_TABLET | ORAL | Status: DC | PRN
  Administered 2022-12-28 – 2022-12-29 (×4): 10 mg via ORAL
  Administered 2022-12-29: 5 mg via ORAL
  Administered 2022-12-30 (×3): 10 mg via ORAL
  Filled 2022-12-27 (×2): qty 2
  Filled 2022-12-27: qty 1
  Filled 2022-12-27 (×5): qty 2

## 2022-12-27 MED ORDER — ORAL CARE MOUTH RINSE: 15.0000 mL | OROMUCOSAL | Status: DC | PRN

## 2022-12-27 MED ORDER — ALBUMIN HUMAN 25 % IV SOLN: INTRAVENOUS | Status: DC | PRN

## 2022-12-27 MED ORDER — PROTAMINE SULFATE 10 MG/ML IV SOLN
INTRAVENOUS | Status: AC
  Filled 2022-12-27: qty 10

## 2022-12-27 MED ORDER — MORPHINE SULFATE (PF) 2 MG/ML IV SOLN
1.0000 mg | INTRAVENOUS | Status: DC | PRN
  Administered 2022-12-27: 4 mg via INTRAVENOUS
  Administered 2022-12-27: 2 mg via INTRAVENOUS
  Filled 2022-12-27: qty 2
  Filled 2022-12-27: qty 1

## 2022-12-27 MED ORDER — ALBUMIN HUMAN 5 % IV SOLN: INTRAVENOUS | Status: DC | PRN

## 2022-12-27 MED ORDER — ONDANSETRON HCL 4 MG/2ML IJ SOLN
4.0000 mg | Freq: Four times a day (QID) | INTRAMUSCULAR | Status: DC | PRN
  Administered 2022-12-27 – 2022-12-29 (×5): 4 mg via INTRAVENOUS
  Filled 2022-12-27 (×6): qty 2

## 2022-12-27 MED ORDER — THROMBIN (RECOMBINANT) 20000 UNITS EX SOLR
CUTANEOUS | Status: AC
  Filled 2022-12-27: qty 20000

## 2022-12-27 MED ORDER — ACETAMINOPHEN 160 MG/5ML PO SOLN: 650.0000 mg | Freq: Once | ORAL | Status: DC

## 2022-12-27 MED ORDER — DEXMEDETOMIDINE HCL IN NACL 400 MCG/100ML IV SOLN
INTRAVENOUS | Status: DC | PRN
  Administered 2022-12-27: .2 ug/kg/h via INTRAVENOUS

## 2022-12-27 MED ORDER — SODIUM BICARBONATE 8.4 % IV SOLN
50.0000 meq | Freq: Once | INTRAVENOUS | Status: AC
  Administered 2022-12-27: 50 meq via INTRAVENOUS

## 2022-12-27 MED ORDER — BISACODYL 10 MG RE SUPP: 10.0000 mg | Freq: Every day | RECTAL | Status: DC

## 2022-12-27 MED ORDER — SODIUM CHLORIDE 0.45 % IV SOLN: INTRAVENOUS | Status: DC | PRN

## 2022-12-27 MED ORDER — ASPIRIN 325 MG PO TBEC: 325.0000 mg | DELAYED_RELEASE_TABLET | Freq: Every day | ORAL | Status: DC

## 2022-12-27 MED ORDER — VANCOMYCIN HCL IN DEXTROSE 1-5 GM/200ML-% IV SOLN
1000.0000 mg | Freq: Once | INTRAVENOUS | Status: AC
  Administered 2022-12-27: 1000 mg via INTRAVENOUS
  Filled 2022-12-27: qty 200

## 2022-12-27 MED ORDER — CHLORHEXIDINE GLUCONATE 0.12 % MT SOLN
15.0000 mL | OROMUCOSAL | Status: AC
  Administered 2022-12-27: 15 mL via OROMUCOSAL
  Filled 2022-12-27: qty 15

## 2022-12-27 MED ORDER — ACETAMINOPHEN 500 MG PO TABS
1000.0000 mg | ORAL_TABLET | Freq: Four times a day (QID) | ORAL | Status: DC
  Administered 2022-12-28 – 2023-01-01 (×15): 1000 mg via ORAL
  Filled 2022-12-27 (×13): qty 2

## 2022-12-27 MED ORDER — PHENYLEPHRINE HCL-NACL 20-0.9 MG/250ML-% IV SOLN
INTRAVENOUS | Status: DC | PRN
  Administered 2022-12-27: 10 ug/min via INTRAVENOUS

## 2022-12-27 MED ORDER — PANCRELIPASE (LIP-PROT-AMYL) 40000-126000 UNITS PO CPEP: 2.0000 | ORAL_CAPSULE | Freq: Three times a day (TID) | ORAL | Status: DC

## 2022-12-27 SURGICAL SUPPLY — 108 items
ADAPTER CARDIO PERF ANTE/RETRO (ADAPTER) ×2 IMPLANT
APPLICATOR TIP COSEAL (VASCULAR PRODUCTS) IMPLANT
ASCENDING AORTIC VALVE 27/29 (Prosthesis & Implant Heart) ×2 IMPLANT
BAG DECANTER FOR FLEXI CONT (MISCELLANEOUS) ×2 IMPLANT
BLADE CLIPPER SURG (BLADE) ×2 IMPLANT
BLADE STERNUM SYSTEM 6 (BLADE) ×2 IMPLANT
BLADE SURG 15 STRL LF DISP TIS (BLADE) ×2 IMPLANT
BLADE SURG 15 STRL SS (BLADE) ×2
CANISTER SUCT 3000ML PPV (MISCELLANEOUS) ×2 IMPLANT
CANNULA AORTIC ROOT 9FR (CANNULA) IMPLANT
CANNULA ARTERIAL NVNT 3/8 22FR (MISCELLANEOUS) IMPLANT
CANNULA GUNDRY RCSP 15FR (MISCELLANEOUS) ×2 IMPLANT
CANNULA MC2 2 STG 36/46 NON-V (CANNULA) IMPLANT
CANNULA SUMP PERICARDIAL (CANNULA) IMPLANT
CANNULA VENOUS 2 STG 34/46 (CANNULA) ×2
CATH HEART VENT LEFT (CATHETERS) ×2 IMPLANT
CATH ROBINSON RED A/P 18FR (CATHETERS) ×6 IMPLANT
CATH THORACIC 36FR (CATHETERS) ×2 IMPLANT
CATH THORACIC 36FR RT ANG (CATHETERS) ×2 IMPLANT
CAUTERY SURG HI TEMP FINE TIP (MISCELLANEOUS) IMPLANT
CNTNR URN SCR LID CUP LEK RST (MISCELLANEOUS) ×2 IMPLANT
CONT SPEC 4OZ STRL OR WHT (MISCELLANEOUS) ×2
CONTAINER PROTECT SURGISLUSH (MISCELLANEOUS) ×2 IMPLANT
DRAPE WARM FLUID 44X44 (DRAPES) IMPLANT
DRSG COVADERM 4X14 (GAUZE/BANDAGES/DRESSINGS) ×2 IMPLANT
ELECT CAUTERY BLADE 6.4 (BLADE) ×2 IMPLANT
ELECT REM PT RETURN 9FT ADLT (ELECTROSURGICAL) ×4
ELECTRODE REM PT RTRN 9FT ADLT (ELECTROSURGICAL) ×4 IMPLANT
FELT TEFLON 1X6 (MISCELLANEOUS) ×2 IMPLANT
FELT TEFLON 6X6 (MISCELLANEOUS) IMPLANT
GAUZE 4X4 16PLY ~~LOC~~+RFID DBL (SPONGE) ×2 IMPLANT
GAUZE SPONGE 4X4 12PLY STRL (GAUZE/BANDAGES/DRESSINGS) ×2 IMPLANT
GLOVE BIO SURGEON STRL SZ 6 (GLOVE) IMPLANT
GLOVE BIO SURGEON STRL SZ 6.5 (GLOVE) IMPLANT
GLOVE BIO SURGEON STRL SZ7 (GLOVE) IMPLANT
GLOVE BIO SURGEON STRL SZ7.5 (GLOVE) IMPLANT
GLOVE SS BIOGEL STRL SZ 7.5 (GLOVE) IMPLANT
GLOVE SURG MICRO LTX SZ7 (GLOVE) ×4 IMPLANT
GOWN STRL REUS W/ TWL LRG LVL3 (GOWN DISPOSABLE) ×8 IMPLANT
GOWN STRL REUS W/ TWL XL LVL3 (GOWN DISPOSABLE) ×2 IMPLANT
GOWN STRL REUS W/TWL LRG LVL3 (GOWN DISPOSABLE) ×8
GOWN STRL REUS W/TWL XL LVL3 (GOWN DISPOSABLE) ×10
GRAFT HEMASHIELD 28X40 (Vascular Products) ×2 IMPLANT
GRAFT HEMASHIELD 28X50 (Vascular Products) IMPLANT
HEMOSTAT POWDER SURGIFOAM 1G (HEMOSTASIS) ×6 IMPLANT
HEMOSTAT SURGICEL 2X14 (HEMOSTASIS) ×2 IMPLANT
INSERT FOGARTY 61MM (MISCELLANEOUS) IMPLANT
INSERT FOGARTY SM (MISCELLANEOUS) ×2 IMPLANT
INSERT FOGARTY XLG (MISCELLANEOUS) IMPLANT
KIT BASIN OR (CUSTOM PROCEDURE TRAY) ×2 IMPLANT
KIT SUCTION CATH 14FR (SUCTIONS) ×2 IMPLANT
KIT TURNOVER KIT B (KITS) ×2 IMPLANT
LINE VENT (MISCELLANEOUS) IMPLANT
LOOP VESSEL SUPERMAXI WHITE (MISCELLANEOUS) IMPLANT
NDL AORTIC AIR ASPIRATING (NEEDLE) IMPLANT
NEEDLE AORTIC AIR ASPIRATING (NEEDLE) IMPLANT
NS IRRIG 1000ML POUR BTL (IV SOLUTION) ×10 IMPLANT
PACK E OPEN HEART (SUTURE) ×2 IMPLANT
PACK OPEN HEART (CUSTOM PROCEDURE TRAY) ×2 IMPLANT
PAD ARMBOARD 7.5X6 YLW CONV (MISCELLANEOUS) ×4 IMPLANT
POSITIONER HEAD DONUT 9IN (MISCELLANEOUS) ×2 IMPLANT
SEALANT SURG COSEAL 4ML (VASCULAR PRODUCTS) IMPLANT
SEALANT SURG COSEAL 8ML (VASCULAR PRODUCTS) IMPLANT
SET MPS 3-ND DEL (MISCELLANEOUS) IMPLANT
SET VEIN GRAFT PERF (SET/KITS/TRAYS/PACK) IMPLANT
SPONGE T-LAP 18X18 ~~LOC~~+RFID (SPONGE) ×8 IMPLANT
SPONGE T-LAP 4X18 ~~LOC~~+RFID (SPONGE) ×4 IMPLANT
STOPCOCK 4 WAY LG BORE MALE ST (IV SETS) IMPLANT
SUT BONE WAX W31G (SUTURE) ×2 IMPLANT
SUT EB EXC GRN/WHT 2-0 V-5 (SUTURE) ×4 IMPLANT
SUT ETHIBON EXCEL 2-0 V-5 (SUTURE) IMPLANT
SUT ETHIBOND 2 0 SH (SUTURE) ×2
SUT ETHIBOND 2 0 SH 36X2 (SUTURE) IMPLANT
SUT ETHIBOND V-5 VALVE (SUTURE) IMPLANT
SUT PROLENE 3 0 RB 1 (SUTURE) IMPLANT
SUT PROLENE 3 0 SH 1 (SUTURE) ×2 IMPLANT
SUT PROLENE 3 0 SH 48 (SUTURE) ×4 IMPLANT
SUT PROLENE 3 0 SH DA (SUTURE) ×2 IMPLANT
SUT PROLENE 3 0 SH1 36 (SUTURE) IMPLANT
SUT PROLENE 4 0 RB 1 (SUTURE) ×12
SUT PROLENE 4 0 SH DA (SUTURE) IMPLANT
SUT PROLENE 4-0 RB1 .5 CRCL 36 (SUTURE) ×8 IMPLANT
SUT PROLENE 5 0 C 1 36 (SUTURE) IMPLANT
SUT PROLENE 6 0 C 1 30 (SUTURE) IMPLANT
SUT SILK 1 TIES 10X30 (SUTURE) IMPLANT
SUT SILK 2 0 SH CR/8 (SUTURE) IMPLANT
SUT STEEL 6MS V (SUTURE) IMPLANT
SUT STEEL STERNAL CCS#1 18IN (SUTURE) IMPLANT
SUT STEEL SZ 6 DBL 3X14 BALL (SUTURE) IMPLANT
SUT VIC AB 1 CTX 36 (SUTURE) ×4
SUT VIC AB 1 CTX36XBRD ANBCTR (SUTURE) ×4 IMPLANT
SUT VIC AB 2-0 CT1 27 (SUTURE)
SUT VIC AB 2-0 CT1 TAPERPNT 27 (SUTURE) IMPLANT
SUT VIC AB 2-0 CTX 27 (SUTURE) IMPLANT
SUT VIC AB 3-0 X1 27 (SUTURE) IMPLANT
SYSTEM SAHARA CHEST DRAIN ATS (WOUND CARE) ×2 IMPLANT
TAPE CLOTH SURG 4X10 WHT LF (GAUZE/BANDAGES/DRESSINGS) IMPLANT
TAPE PAPER 2X10 WHT MICROPORE (GAUZE/BANDAGES/DRESSINGS) IMPLANT
TOWEL GREEN STERILE (TOWEL DISPOSABLE) ×2 IMPLANT
TOWEL GREEN STERILE FF (TOWEL DISPOSABLE) ×2 IMPLANT
TRAY FOLEY SLVR 14FR TEMP STAT (SET/KITS/TRAYS/PACK) ×2 IMPLANT
TUBE CONNECTING 20X1/4 (TUBING) IMPLANT
UNDERPAD 30X36 HEAVY ABSORB (UNDERPADS AND DIAPERS) ×2 IMPLANT
VALVE AORTIC ASCENDING 27/29 (Prosthesis & Implant Heart) IMPLANT
VENT LEFT HEART 12002 (CATHETERS) ×2
WATER STERILE IRR 1000ML POUR (IV SOLUTION) ×4 IMPLANT
WRENCH TORQUE (MISCELLANEOUS) IMPLANT
YANKAUER SUCT BULB TIP NO VENT (SUCTIONS) IMPLANT

## 2022-12-27 NOTE — Anesthesia Procedure Notes (Addendum)
Procedure Name: Intubation Date/Time: 12/27/2022 7:47 AM  Performed by: Kriste Basque, RNPre-anesthesia Checklist: Patient identified, Emergency Drugs available, Suction available and Patient being monitored Patient Re-evaluated:Patient Re-evaluated prior to induction Oxygen Delivery Method: Circle system utilized Preoxygenation: Pre-oxygenation with 100% oxygen Induction Type: IV induction Ventilation: Two handed mask ventilation required Laryngoscope Size: 3 (LoPro) Grade View: Grade I Tube type: Oral Tube size: 8.0 mm Number of attempts: 1 Airway Equipment and Method: Stylet, Oral airway and Video-laryngoscopy Placement Confirmation: ETT inserted through vocal cords under direct vision, positive ETCO2 and breath sounds checked- equal and bilateral Tube secured with: Tape Dental Injury: Teeth and Oropharynx as per pre-operative assessment

## 2022-12-27 NOTE — Brief Op Note (Signed)
12/27/2022  7:53 AM  PATIENT:  Douglas Phillips  47 y.o. male  PRE-OPERATIVE DIAGNOSIS:  Thoracic Aortic Aneurysm, Aortic Insufficiency, Bicuspid Aortic Valve  POST-OPERATIVE DIAGNOSIS:  Thoracic Aortic Aneurysm, Aortic Insufficiency, Bicuspid Aortic Valve  PROCEDURE:  Procedure(s) with comments: BENTALL PROCEDURE USING 27/29MM ON-X ASCENDING AORTIC PROSTHESIS AND 28x10MM HEMASHIELD PLATINUM GRAFT (N/A) - CIRC ARREST THORACIC ASCENDING ANEURYSM REPAIR (AAA) (N/A) TRANSESOPHAGEAL ECHOCARDIOGRAM (TEE) (N/A)  SURGEON:  Surgeon(s) and Role:    * Bartle, Fernande Boyden, MD - Primary  PHYSICIAN ASSISTANT: Savien Mamula PA-C  ASSISTANTS: MARIE IRWIN RNFA   ANESTHESIA:   general  EBL: 1050 ml  BLOOD ADMINISTERED:none  DRAINS:  MEDIASTINAL CHEST TUBES    LOCAL MEDICATIONS USED:  NONE  SPECIMEN:  Source of Specimen:  AORTIC VALVE LEAFLETS  DISPOSITION OF SPECIMEN:  PATHOLOGY  COUNTS:  YES  TOURNIQUET:  * No tourniquets in log *  DICTATION: .Dragon Dictation  PLAN OF CARE: Admit to inpatient   PATIENT DISPOSITION:  ICU - intubated and hemodynamically stable.   Delay start of Pharmacological VTE agent (>24hrs) due to surgical blood loss or risk of bleeding: yes  COMPLICATIONS: NO KNOWN

## 2022-12-27 NOTE — Progress Notes (Signed)
Swan-Ganz catheter with inaccurate numbers and waveform. Per Bartle MD, coiled in the RA and will not advance. Can discontinue it once it's not needed for IV medications.

## 2022-12-27 NOTE — Anesthesia Procedure Notes (Signed)
Central Venous Catheter Insertion Performed by: Murvin Natal, MD, anesthesiologist Start/End1/15/2024 6:50 AM, 12/27/2022 7:10 AM Patient location: Pre-op. Preanesthetic checklist: patient identified, IV checked, site marked, risks and benefits discussed, surgical consent, monitors and equipment checked, pre-op evaluation, timeout performed and anesthesia consent Position: Trendelenburg Lidocaine 1% used for infiltration and patient sedated Hand hygiene performed , maximum sterile barriers used  and Seldinger technique used Catheter size: 9 Fr Total catheter length 12. PA cath was placed.MAC introducer Swan type:thermodilution PA Cath depth:60 Procedure performed using ultrasound guided technique. Ultrasound Notes:anatomy identified, needle tip was noted to be adjacent to the nerve/plexus identified and no ultrasound evidence of intravascular and/or intraneural injection Attempts: 1 Following insertion, line sutured, dressing applied and Biopatch. Post procedure assessment: blood return through all ports, free fluid flow and no air  Patient tolerated the procedure well with no immediate complications.

## 2022-12-27 NOTE — Interval H&P Note (Signed)
History and Physical Interval Note:  12/27/2022 6:56 AM  Douglas Phillips  has presented today for surgery, with the diagnosis of TAA AI BAV.  The various methods of treatment have been discussed with the patient and family. After consideration of risks, benefits and other options for treatment, the patient has consented to  Procedure(s) with comments: BENTALL PROCEDURE (N/A) - CIRC ARREST THORACIC ASCENDING ANEURYSM REPAIR (AAA) (N/A) TRANSESOPHAGEAL ECHOCARDIOGRAM (TEE) (N/A) as a surgical intervention.  The patient's history has been reviewed, patient examined, no change in status, stable for surgery.  I have reviewed the patient's chart and labs.  Questions were answered to the patient's satisfaction.     Gaye Pollack

## 2022-12-27 NOTE — Progress Notes (Signed)
Settings modified per cardiac rapid wean protocol

## 2022-12-27 NOTE — Anesthesia Procedure Notes (Signed)
Arterial Line Insertion Performed by: Murvin Natal, MD, Josephine Igo, CRNA, CRNA  Preanesthetic checklist: patient identified, IV checked, site marked, risks and benefits discussed, surgical consent, monitors and equipment checked, pre-op evaluation, timeout performed and anesthesia consent Lidocaine 1% used for infiltration and patient sedated Left, radial was placed Catheter size: 20 G Hand hygiene performed  and maximum sterile barriers used  Allen's test indicative of satisfactory collateral circulation Attempts: 1 Procedure performed without using ultrasound guided technique. Following insertion, dressing applied and Biopatch. Post procedure assessment: normal  Patient tolerated the procedure well with no immediate complications.

## 2022-12-27 NOTE — Procedures (Signed)
Extubation Procedure Note  Patient Details:   Name: Douglas Phillips DOB: 1976-01-17 MRN: [id number]   Airway Documentation:    Vent end date: 12/27/22 Vent end time: 1834   Evaluation  O2 sats: stable throughout Complications: No apparent complications Patient did tolerate procedure well. Bilateral Breath Sounds: Diminished   Yes, pt able to cough to clear secretions and vocalize name. Pt had NIF -25 and VC 1.2L, pt was positive for cuff leak prior to extubation. Pt placed on 4L humidified nasal cannula and tolerating well at this time.  Virgilio Frees 12/27/2022, 6:36 PM

## 2022-12-27 NOTE — Progress Notes (Signed)
T[facility] Progress Note: POD 0 Bentall  Low dose neo  Apaced at 80.   Good UOP  CT dry  Plan: standard POD 0 cardiac surgery protocols.      Latest Ref Rng & Units 12/27/2022    2:27 PM 12/27/2022    1:01 PM 12/27/2022   12:55 PM  CBC  WBC 4.0 - 10.5 K/uL 10.3     Hemoglobin 13.0 - 17.0 g/dL 10.6  9.5  9.5   Hematocrit 39.0 - 52.0 % 30.7  28.0  28.0   Platelets 150 - 400 K/uL 144          Latest Ref Rng & Units 12/27/2022    1:01 PM 12/27/2022   12:55 PM 12/27/2022   12:27 PM  CMP  Glucose 70 - 99 mg/dL 134   125   BUN 6 - 20 mg/dL 18   19   Creatinine 0.61 - 1.24 mg/dL 0.70   0.70   Sodium 135 - 145 mmol/L 138  138  137   Potassium 3.5 - 5.1 mmol/L 4.5  4.5  5.1   Chloride 98 - 111 mmol/L 104   104     ABG    Component Value Date/Time   PHART 7.410 12/27/2022 1255   PCO2ART 34.9 12/27/2022 1255   PO2ART 329 (H) 12/27/2022 1255   HCO3 22.1 12/27/2022 1255   TCO2 23 12/27/2022 1301   ACIDBASEDEF 2.0 12/27/2022 1255   O2SAT 100 12/27/2022 1255    Vent Mode: PRVC FiO2 (%):  [50 %] 50 % Set Rate:  [12 bmp-16 bmp] 16 bmp Vt Set:  [520 mL] 520 mL PEEP:  [5 cmH20] 5 cmH20 Pressure Support:  [10 cmH20] 10 cmH20 Plateau Pressure:  [12 cmH20] 12 cmH20

## 2022-12-27 NOTE — Anesthesia Postprocedure Evaluation (Signed)
Anesthesia Post Note  Patient: EWELL BENASSI  Procedure(s) Performed: BENTALL PROCEDURE USING 27/29MM ON-X ASCENDING AORTIC PROSTHESIS AND 28x10MM HEMASHIELD PLATINUM GRAFT (Chest) THORACIC ASCENDING ANEURYSM REPAIR (AAA) (Chest) TRANSESOPHAGEAL ECHOCARDIOGRAM (TEE)     Patient location during evaluation: SICU Anesthesia Type: General Level of consciousness: sedated Pain management: pain level controlled Vital Signs Assessment: post-procedure vital signs reviewed and stable Respiratory status: patient remains intubated per anesthesia plan Cardiovascular status: stable Postop Assessment: no apparent nausea or vomiting Anesthetic complications: no Comments: Pulmonary artery catheter not working well in the ICU. Pulled back to 20 cm and re-floated to 52 cm.   No notable events documented.  Last Vitals:  Vitals:   12/27/22 1515 12/27/22 1530  BP:    Pulse: 80 80  Resp: 16 16  Temp: (!) 35.3 C (!) 35.5 C  SpO2: 100% 100%    Last Pain:  Vitals:   12/27/22 1430  TempSrc: Core  PainSc:                  Karyl Kinnier Dawnn Nam

## 2022-12-27 NOTE — Transfer of Care (Signed)
Immediate Anesthesia Transfer of Care Note  Patient: Douglas Phillips  Procedure(s) Performed: BENTALL PROCEDURE USING 27/29MM ON-X ASCENDING AORTIC PROSTHESIS AND 28x10MM HEMASHIELD PLATINUM GRAFT (Chest) THORACIC ASCENDING ANEURYSM REPAIR (AAA) (Chest) TRANSESOPHAGEAL ECHOCARDIOGRAM (TEE)  Patient Location: ICU  Anesthesia Type:General  Level of Consciousness: sedated and Patient remains intubated per anesthesia plan  Airway & Oxygen Therapy: Patient remains intubated per anesthesia plan and Patient placed on Ventilator (see vital sign flow sheet for setting)  Post-op Assessment: Report given to RN and Post -op Vital signs reviewed and stable  Post vital signs: Reviewed and stable  Last Vitals:  Vitals Value Taken Time  BP 119/63   Temp    Pulse 80 12/27/22 1414  Resp 12 12/27/22 1414  SpO2 95 % 12/27/22 1414  Vitals shown include unvalidated device data.  Last Pain:  Vitals:   12/27/22 0603  PainSc: 0-No pain         Complications: No notable events documented.  Patient transported to ICU with standard monitors (HR, BP, SPO2, RR) and emergency drugs/equipment. Controlled ventilation maintained via ambu bag. Report given to bedside RN and respiratory therapist. Pt connected to ICU monitor and ventilator. All questions answered and vital signs stable before leaving.

## 2022-12-27 NOTE — Op Note (Signed)
CARDIOVASCULAR SURGERY OPERATIVE NOTE  12/27/2022  Surgeon:  Gaye Pollack, MD  First Assistant: Jadene Pierini,  PA-C:  An experienced assistant was required given the complexity of this surgery and the standard of surgical care. The assistant was needed for exposure, dissection, suctioning, retraction of delicate tissues and sutures, instrument exchange and for overall help during this procedure.    Preoperative Diagnosis:  Bicuspid aortic valve with 5.2 cm aortic root and ascending aortic aneurysm.   Postoperative Diagnosis:  Same   Procedure:  Median Sternotomy Extracorporeal circulation 3.   Replacement of ascending aorta under deep hypothermic circulatory arrest using a 28 x 10 mm Hemashield Platinum graft 4.   Bentall procedure using a 27/29 mm On-X mechanical valved graft.  Anesthesia:  General Endotracheal   Clinical History/Surgical Indication:  This 47 year old gentleman has a bicuspid aortic valve with moderate aortic insufficiency and a 5.0 cm aortic root aneurysm with a 4.7 cm ascending aortic aneurysm.  His left ventricular diastolic dimension has gone from 5.0 to 5.5 cm over the past year.  With a bicuspid aortic valve I think that it is best to proceed with replacement of his aortic valve, aortic root and ascending aortic aneurysm to decrease the risk of aortic dissection and progressive left ventricular dysfunction.  His aortic root is at the surgical threshold for younger patient with a bicuspid aortic valve and given his tendency to hypertension I think he is best treated surgically in the near future.  I reviewed his echocardiogram and CTA studies with him and answered his questions.  We discussed the pros and cons of mechanical and bioprosthetic valves and I think a mechanical valve would be best that his young age.  He has no contraindication to anticoagulation and feels that he could be compliant with taking Coumadin and maintaining follow-up.  He had preoperative  cardiac catheterization since his previous cardiac CT had shown significant multivessel coronary plaque although his FFR was negative.  Cardiac cath on 12/17/22 showed moderate diffuse CAD with a high grade lesion of a small distal LAD that did not reach the apex. I don't think any coronary revascularization is warranted.   I discussed the operative procedure with the patient including alternatives, benefits and risks; including but not limited to bleeding, blood transfusion, infection, stroke, myocardial infarction, graft failure, heart block requiring a permanent pacemaker, organ dysfunction, and death.  Myrtha Mantis understands and agrees to proceed.    Preparation:  The patient was seen in the preoperative holding area and the correct patient, correct operation were confirmed with the patient after reviewing the medical record and catheterization. The consent was signed by me. Preoperative antibiotics were given. A pulmonary arterial line and radial arterial line were placed by the anesthesia team. The patient was taken back to the operating room and positioned supine on the operating room table. After being placed under general endotracheal anesthesia by the anesthesia team a foley catheter was placed. The neck, chest, abdomen, and both legs were prepped with betadine soap and solution and draped in the usual sterile manner. A surgical time-out was taken and the correct patient and operative procedure were confirmed with the nursing and anesthesia staff.   Cardiopulmonary Bypass:  A median sternotomy was performed. The pericardium was opened in the midline. Right ventricular function appeared normal. The ascending aorta was aneurysmal and tapered back to normal just before the innominate artery. The patient was fully systemically heparinized and the ACT was maintained > 400 sec. The mid ascending  aorta was cannulated with a 62 F aortic cannula for arterial inflow. Venous cannulation was performed  via the right atrial appendage using a two-staged venous cannula. A temperature probe was inserted into the interventricular septum and an insulating pad was placed in the pericardium. CO2 was insufflated into the pericardium throughout the case to minimize intracardiac air.      Resection and grafting of ascending aortic aneurysm:  The patient was placed on cardiopulmonary bypass and a left ventricular vent was placed via the right superior pulmonary vein. Systemic cooling was begun with a goal temperature of 18 degrees centigrade by bladder and rectal temperature probes. A retrograde cardioplegia cannula was placed through the right atrium into the coronary sinus without difficulty. A retrograde cerebral perfusion cannula was placed into the SVC through a pursestring suture and the SVC was encircled with a silastic tape. After 20 minutes of cooling the target temperature of 22 degrees centigrade was reached. BIS was zero. The patient was given Propofol and 125 mg of Solumedrol. The head was packed in ice. The bed was placed in steep trendelenburg. Circulatory arrest was begun and the blood volume emptied into the venous reservoir.  Cold KBC retrograde cardioplegia was given and myocardial temperature dropped to 10 degrees centigrade. Additional doses were given at approximately 60 minute intervals throughout the period of circulatory arrest and cross-clamping. Complete diastolic arrest was maintained. After the initial dose of cardioplegia was given the SVC was occluded with the silastic tape and retrograde cerebral perfusion was begun. The aortic cannula was removed. The aorta was transected just proximal to the innominate artery beveling the resection out along the undersurface of the aortic arch (Hemiarch replacement). The aortic diameter was measured at 28 mm here. A 28 x 10 mm Hemasheild Platinum vascular graft was prepared. ( Catalog # [id number], Lot # [id number], SN [id number]). It was anastomosed  to the aortic arch in an end to end manner using 3-0 prolene continuous suture with a felt strip to reinforce the anastomisis. A light coating of CoSeal was applied to seal needle holes. The arterial end of the bypass circuit was then connected to the 67mm side arm graft and circulation was slowly resumed. The tape was removed from the SVC and retrograde cerebral perfusion stopped. The aortic graft was cross-clamped proximal to the side arm graft and full CPB support was resumed. Circulatory arrest time was 19 minutes. Retrograde cerebral perfusion time was 6 minutes.   Bentall Procedure:   The ascending aorta was mobilized from the right pulmonary artery and main PA. It was opened longitudinally and the valve inspected. It was a bicuspid valve with fusion of the right and left cusps, three commissures and 1 raphe. There was fenestration of the commissure between the right and non-coronary leaflets with prolapse of the non-coronary leaflet. The right and left coronary arteries were removed from the aortic root with a button of aortic wall around the ostia. They were retracted carefully out of the way with stay sutures to prevent rotation. The native valve was excised taking care to remove all particulate debri. The annulus was sized and a 27/29 mm On-X Mechanical Valved Graft was chosen. ( Ref # [id number], Serial # [id number]) A series of pledgetted 2-0 Ethibond horizontal mattress sutures were placed around the annulus with the pledgets in a sub-annular position. The sutures were placed through the valve sewing ring. The valve was lowered into place and the sutures at the hinge posts tied first followed by the remaining sutures. The  valve seated nicely. The discs moved normally. Small openings were made in the graft for the coronary anastomoses using a thermal cautery. Then the left and right coronary buttons were anastomosed to the graft in an end to side manner using continuous 5-0 prolene suture. A light  coating of CoSeal was applied to each anastomosis for hemostasis. The two grafts were then cut to the appropriate length and anastomosed end to end using continuous 3-0 prolene suture. CoSeal was applied to seal the needle holes in the grafts. A vent cannula was placed into the graft to remove any air. Deairing maneuvers were performed and the bed placed in trendelenburg position.   Completion:  The patient was rewarmed to 37 degrees Centigrade. The crossclamp was removed with a time of 146 minutes. There was spontaneous return of ventricular fibrillation and the patient was defibrillated to junctional bradycardia. The position of the grafts was satisfactory. The vascular anastomoses all appeared hemostatic. Two temporary epicardial pacing wires were placed on the right atrium and two on the right ventricle. The patient was weaned from CPB without difficulty on no inotropes. CPB time was 220 minutes. Cardiac output was 5 LPM. TEE showed normal prosthetic valve function with trivial AI washing jets, trace central MR that was unchanged, normal LV systolic function. Heparin was fully reversed with protamine and the venous cannula removed. The aortic side arm graft was ligated with a #1 Silk tie, divided and suture ligated with a 3-0 Prolene pledgetted horizontal mattress suture. Hemostasis was achieved. Mediastinal drainage tubes were placed. The sternum was closed with double #6 stainless steel wires. The fascia was closed with continuous # 1 vicryl suture. The subcutaneous tissue was closed with 2-0 vicryl continuous suture. The skin was closed with 3-0 vicryl subcuticular suture. All sponge, needle, and instrument counts were reported correct at the end of the case. Dry sterile dressings were placed over the incisions and around the chest tubes which were connected to pleurevac suction. The patient was then transported to the cardiac intensive care unit in stable condition.

## 2022-12-28 ENCOUNTER — Inpatient Hospital Stay (HOSPITAL_COMMUNITY): Payer: BC Managed Care – PPO

## 2022-12-28 ENCOUNTER — Encounter (HOSPITAL_COMMUNITY): Payer: Self-pay | Admitting: Surgery

## 2022-12-28 DIAGNOSIS — D62 Acute posthemorrhagic anemia: Secondary | ICD-10-CM | POA: Diagnosis not present

## 2022-12-28 DIAGNOSIS — Z23 Encounter for immunization: Secondary | ICD-10-CM | POA: Diagnosis not present

## 2022-12-28 DIAGNOSIS — I314 Cardiac tamponade: Secondary | ICD-10-CM | POA: Diagnosis not present

## 2022-12-28 DIAGNOSIS — I7121 Aneurysm of the ascending aorta, without rupture: Secondary | ICD-10-CM | POA: Diagnosis not present

## 2022-12-28 LAB — BASIC METABOLIC PANEL
Anion gap: 7 (ref 5–15)
Anion gap: 8 (ref 5–15)
BUN: 17 mg/dL (ref 6–20)
BUN: 18 mg/dL (ref 6–20)
CO2: 23 mmol/L (ref 22–32)
CO2: 26 mmol/L (ref 22–32)
Calcium: 8 mg/dL — ABNORMAL LOW (ref 8.9–10.3)
Calcium: 8 mg/dL — ABNORMAL LOW (ref 8.9–10.3)
Chloride: 108 mmol/L (ref 98–111)
Chloride: 102 mmol/L (ref 98–111)
Creatinine, Ser: 0.81 mg/dL (ref 0.61–1.24)
Creatinine, Ser: 0.92 mg/dL (ref 0.61–1.24)
GFR, Estimated: >60 mL/min (ref >60)
GFR, Estimated: >60 mL/min (ref >60)
Glucose, Bld: 132 mg/dL — ABNORMAL HIGH (ref 70–99)
Glucose, Bld: 202 mg/dL — ABNORMAL HIGH (ref 70–99)
Potassium: 4.1 mmol/L (ref 3.5–5.1)
Potassium: 4.2 mmol/L (ref 3.5–5.1)
Sodium: 138 mmol/L (ref 135–145)
Sodium: 136 mmol/L (ref 135–145)

## 2022-12-28 LAB — GLUCOSE, CAPILLARY
Glucose-Capillary: 110 mg/dL — ABNORMAL HIGH (ref 70–99)
Glucose-Capillary: 123 mg/dL — ABNORMAL HIGH (ref 70–99)
Glucose-Capillary: 132 mg/dL — ABNORMAL HIGH (ref 70–99)
Glucose-Capillary: 134 mg/dL — ABNORMAL HIGH (ref 70–99)
Glucose-Capillary: 137 mg/dL — ABNORMAL HIGH (ref 70–99)
Glucose-Capillary: 143 mg/dL — ABNORMAL HIGH (ref 70–99)
Glucose-Capillary: 147 mg/dL — ABNORMAL HIGH (ref 70–99)
Glucose-Capillary: 155 mg/dL — ABNORMAL HIGH (ref 70–99)
Glucose-Capillary: 159 mg/dL — ABNORMAL HIGH (ref 70–99)
Glucose-Capillary: 165 mg/dL — ABNORMAL HIGH (ref 70–99)
Glucose-Capillary: 173 mg/dL — ABNORMAL HIGH (ref 70–99)
Glucose-Capillary: 184 mg/dL — ABNORMAL HIGH (ref 70–99)
Glucose-Capillary: 194 mg/dL — ABNORMAL HIGH (ref 70–99)

## 2022-12-28 LAB — CBC
HCT: 27.3 % — ABNORMAL LOW (ref 39.0–52.0)
HCT: 28.2 % — ABNORMAL LOW (ref 39.0–52.0)
Hemoglobin: 9.5 g/dL — ABNORMAL LOW (ref 13.0–17.0)
Hemoglobin: 9.4 g/dL — ABNORMAL LOW (ref 13.0–17.0)
MCH: 31.9 pg (ref 26.0–34.0)
MCH: 31.6 pg (ref 26.0–34.0)
MCHC: 34.8 g/dL (ref 30.0–36.0)
MCHC: 33.3 g/dL (ref 30.0–36.0)
MCV: 91.6 fL (ref 80.0–100.0)
MCV: 94.9 fL (ref 80.0–100.0)
Platelets: 156 10*3/uL (ref 150–400)
Platelets: 175 10*3/uL (ref 150–400)
RBC: 2.98 MIL/uL — ABNORMAL LOW (ref 4.22–5.81)
RBC: 2.97 MIL/uL — ABNORMAL LOW (ref 4.22–5.81)
RDW: 12.6 % (ref 11.5–15.5)
RDW: 12.8 % (ref 11.5–15.5)
WBC: 11.4 10*3/uL — ABNORMAL HIGH (ref 4.0–10.5)
WBC: 14.1 10*3/uL — ABNORMAL HIGH (ref 4.0–10.5)
nRBC: 0 % (ref 0.0–0.2)
nRBC: 0 % (ref 0.0–0.2)

## 2022-12-28 LAB — MAGNESIUM
Magnesium: 2.5 mg/dL — ABNORMAL HIGH (ref 1.7–2.4)
Magnesium: 2.1 mg/dL (ref 1.7–2.4)

## 2022-12-28 LAB — SURGICAL PATHOLOGY

## 2022-12-28 MED ORDER — INSULIN DETEMIR 100 UNIT/ML ~~LOC~~ SOLN
25.0000 [IU] | Freq: Every day | SUBCUTANEOUS | Status: DC
  Administered 2022-12-28 – 2022-12-30 (×3): 25 [IU] via SUBCUTANEOUS
  Filled 2022-12-28 (×4): qty 0.25

## 2022-12-28 MED ORDER — METOPROLOL TARTRATE 12.5 MG HALF TABLET
12.5000 mg | ORAL_TABLET | Freq: Two times a day (BID) | ORAL | Status: DC
  Administered 2022-12-28 – 2022-12-30 (×4): 12.5 mg via ORAL
  Filled 2022-12-28 (×4): qty 1

## 2022-12-28 MED ORDER — INSULIN ASPART 100 UNIT/ML IJ SOLN
0.0000 [IU] | INTRAMUSCULAR | Status: DC
  Administered 2022-12-28 (×2): 4 [IU] via SUBCUTANEOUS
  Administered 2022-12-28: 2 [IU] via SUBCUTANEOUS
  Administered 2022-12-29 (×2): 4 [IU] via SUBCUTANEOUS

## 2022-12-28 MED ORDER — POTASSIUM CHLORIDE 10 MEQ/50ML IV SOLN
10.0000 meq | INTRAVENOUS | Status: AC
  Administered 2022-12-28 (×3): 10 meq via INTRAVENOUS
  Filled 2022-12-28 (×3): qty 50

## 2022-12-28 MED ORDER — ASPIRIN 81 MG PO CHEW
81.0000 mg | CHEWABLE_TABLET | Freq: Every day | ORAL | Status: DC
  Filled 2022-12-28: qty 1

## 2022-12-28 MED ORDER — METOCLOPRAMIDE HCL 5 MG/ML IJ SOLN
10.0000 mg | Freq: Four times a day (QID) | INTRAMUSCULAR | Status: AC
  Administered 2022-12-28 – 2022-12-29 (×4): 10 mg via INTRAVENOUS
  Filled 2022-12-28 (×3): qty 2

## 2022-12-28 MED ORDER — KETOROLAC TROMETHAMINE 15 MG/ML IJ SOLN
15.0000 mg | Freq: Four times a day (QID) | INTRAMUSCULAR | Status: AC | PRN
  Administered 2022-12-28: 15 mg via INTRAVENOUS
  Filled 2022-12-28: qty 1

## 2022-12-28 MED ORDER — METOPROLOL TARTRATE 25 MG/10 ML ORAL SUSPENSION: 25.0000 mg | Freq: Two times a day (BID) | ORAL | Status: DC

## 2022-12-28 MED ORDER — ORAL CARE MOUTH RINSE: 15.0000 mL | OROMUCOSAL | Status: DC | PRN

## 2022-12-28 MED ORDER — FUROSEMIDE 10 MG/ML IJ SOLN
40.0000 mg | Freq: Two times a day (BID) | INTRAMUSCULAR | Status: AC
  Administered 2022-12-28 (×2): 40 mg via INTRAVENOUS
  Filled 2022-12-28 (×2): qty 4

## 2022-12-28 MED ORDER — METOPROLOL TARTRATE 25 MG/10 ML ORAL SUSPENSION: 12.5000 mg | Freq: Two times a day (BID) | ORAL | Status: DC

## 2022-12-28 MED ORDER — ASPIRIN 81 MG PO TBEC
81.0000 mg | DELAYED_RELEASE_TABLET | Freq: Every day | ORAL | Status: DC
  Administered 2022-12-28 – 2022-12-30 (×3): 81 mg via ORAL
  Filled 2022-12-28 (×3): qty 1

## 2022-12-28 MED ORDER — POTASSIUM CHLORIDE 10 MEQ/50ML IV SOLN
10.0000 meq | INTRAVENOUS | Status: AC
  Administered 2022-12-28 (×2): 10 meq via INTRAVENOUS
  Filled 2022-12-28 (×2): qty 50

## 2022-12-28 MED ORDER — SODIUM CHLORIDE 0.9 % IV SOLN
12.5000 mg | Freq: Four times a day (QID) | INTRAVENOUS | Status: DC | PRN
  Administered 2022-12-28 – 2022-12-31 (×3): 12.5 mg via INTRAVENOUS
  Filled 2022-12-28: qty 0.5
  Filled 2022-12-28 (×2): qty 12.5

## 2022-12-28 MED ORDER — METOPROLOL TARTRATE 25 MG PO TABS
25.0000 mg | ORAL_TABLET | Freq: Two times a day (BID) | ORAL | Status: DC
  Administered 2022-12-28: 25 mg via ORAL
  Filled 2022-12-28: qty 1

## 2022-12-28 NOTE — Progress Notes (Signed)
Patient ID: Douglas Phillips, male   DOB: Jun 05, 1976, 47 y.o.   MRN: [id number]  TCTS Evening Rounds:   Hemodynamically stable   Has had nausea all day. Receiving Zofran, Reglan and Phenergan.  Urine output good -1500 cc today CT output low  CBC    Component Value Date/Time   WBC 14.1 (H) 12/28/2022 1621   RBC 2.97 (L) 12/28/2022 1621   HGB 9.4 (L) 12/28/2022 1621   HGB 12.6 (L) 12/02/2022 1056   HCT 28.2 (L) 12/28/2022 1621   HCT 36.5 (L) 12/02/2022 1056   PLT 175 12/28/2022 1621   PLT 227 12/02/2022 1056   MCV 94.9 12/28/2022 1621   MCV 90 12/02/2022 1056   MCH 31.6 12/28/2022 1621   MCHC 33.3 12/28/2022 1621   RDW 12.8 12/28/2022 1621   RDW 12.3 12/02/2022 1056   LYMPHSABS 1.3 04/17/2021 0840   MONOABS 0.6 04/17/2021 0840   EOSABS 0.1 04/17/2021 0840   BASOSABS 0.0 04/17/2021 0840     BMET    Component Value Date/Time   NA 136 12/28/2022 1621   NA 140 12/02/2022 1056   K 4.2 12/28/2022 1621   CL 102 12/28/2022 1621   CO2 26 12/28/2022 1621   GLUCOSE 202 (H) 12/28/2022 1621   BUN 18 12/28/2022 1621   BUN 21 12/02/2022 1056   CREATININE 0.92 12/28/2022 1621   CALCIUM 8.0 (L) 12/28/2022 1621   EGFR 100 12/02/2022 1056   GFRNONAA >60 12/28/2022 1621     A/P:  Stable postop course. Continue current plans

## 2022-12-28 NOTE — Hospital Course (Addendum)
HPI:   The patient is a 47 year old gentleman has a bicuspid aortic valve with mild to moderate AI and normal LV function and a left ventricular diastolic dimension of 5.0 cm on echocardiogram in October 2022.  He also has a 5.2 cm sinus diameter on gated cardiac CTA from 04/02/2022 which is unchanged and a 4.7 cm ascending aorta on CTA of the chest which is slightly larger than on his prior CTA in October 2022 when it was measured at 4.3 cm. Coronary calcium score was 1592 but FFR was normal in all three coronary vessels in April 2023.  His most recent echo on 10/04/2022 showed a probable bicuspid aortic valve with moderate aortic insufficiency.  The mean gradient across aortic valve was only 4 mmHg.  The aortic root was measured at 4.9 cm with the ascending aorta also measuring 4.9 cm.  Left ventricular ejection fraction was 60 to 65% with grade 2 diastolic dysfunction.  The left ventricular diastolic diameter has increased to 5.5 cm.   He denies any chest pain or shortness of breath.  He denies orthopnea or PND.  He said no peripheral edema.  He saw Dr. Phineas Inches recently.  His main complaint today is related to ocular migraines for which he is seeing Dr. Billey Gosling.  He recently had an MRI of the brain that was unremarkable.  He said that his ocular migraines are brought on by stress.  Dr. Cyndia Bent evaluated the patient and all relevant studies and recommended proceeding with Bentall procedure.  He was admitted this hospitalization electively.   Hospital course  Patient was admitted and taken the operating room at which time he underwent the following procedure: Median Sternotomy Extracorporeal circulation 3.   Replacement of ascending aorta under deep hypothermic circulatory arrest using a 28 x 10 mm Hemashield Platinum graft 4.   Bentall procedure using a 27/29 mm On-X mechanical valved graft.   He tolerated this well and was taken to the surgical ICU in stable condition.  Postoperative  hospital course:  Patient was extubated using standard post cardiac surgical protocols without difficulty.  He has remained hemodynamically stable with normal sinus rhythm.  He had moderate chest tube drainage and they were kept in place on postop day #1.  Swan-Ganz and arterial line were removed on postoperative day 1.  He was started on Coumadin for a mechanical valve on postoperative day #2.  INR is being monitored daily with dosing.  He does have an expected postoperative volume overload and has been started on a course of diuresis.  He has a moderate expected postoperative acute blood loss anemia which is being monitored clinically.  He has not required transfusion.  On postop day #3 he was stable for transfer to [place] for further convalescence;however, a bed was not available. INR increased from 1.6 to 2.8 on 01/19. He was not given Coumadin on 01/19 and ec asa was stopped.  Follow up INR is 2.3 and he will be discharged home on 2.5 mg daily. He has been tolerating a diet. He is ambulating on room air with good oxygenation. His wounds are clean, dry, and healing without signs of infection. He is medically stable for discharge home today.

## 2022-12-28 NOTE — Progress Notes (Signed)
1 Day Post-Op Procedure(s) (LRB): BENTALL PROCEDURE USING 27/29MM ON-X ASCENDING AORTIC PROSTHESIS AND 28x10MM HEMASHIELD PLATINUM GRAFT (N/A) THORACIC ASCENDING ANEURYSM REPAIR (AAA) (N/A) TRANSESOPHAGEAL ECHOCARDIOGRAM (TEE) (N/A) Subjective: Nausea all night. Not improved with Zofran.  Objective: Vital signs in last 24 hours: Temp:  [95.2 F (35.1 C)-98.8 F (37.1 C)] 97.9 F (36.6 C) (01/16 0630) Pulse Rate:  [48-81] 73 (01/16 0630) Cardiac Rhythm: Atrial paced (01/15 1900) Resp:  [11-23] 20 (01/16 0630) BP: (102-128)/(58-78) 115/71 (01/16 0600) SpO2:  [96 %-100 %] 100 % (01/16 0630) Arterial Line BP: (78-184)/(47-86) 154/55 (01/16 0630) FiO2 (%):  [40 %-50 %] 40 % (01/15 1810) Weight:  [113.9 kg] 113.9 kg (01/16 0500)  Hemodynamic parameters for last 24 hours: PAP: (1-47)/(-9-23) 16/7 CO:  [3.5 L/min-7.2 L/min] 6.3 L/min CI:  [1.6 L/min/m2-3.3 L/min/m2] 2.9 L/min/m2  Intake/Output from previous day: 01/15 0701 - 01/16 0700 In: 4380.5 [I.V.:2227.4; Blood:535; IV CLEXNTZGY:1749] Out: 4496 [Urine:2890; Chest Tube:620] Intake/Output this shift: Total I/O In: 1070.3 [I.V.:582.8; IV Piggyback:487.5] Out: 1035 [Urine:675; Chest Tube:360]  General appearance: alert and cooperative Neurologic: intact Heart: regular rate and rhythm and crisp mechanical valve click Lungs: clear to auscultation bilaterally Abdomen: soft, non-tender; bowel sounds present.  Extremities: edema moderate Wound: dressing dry  Lab Results: Recent Labs    12/27/22 2028 12/28/22 0401  WBC 14.7* 11.4*  HGB 10.6* 9.5*  HCT 30.4* 27.3*  PLT 194 156   BMET:  Recent Labs    12/27/22 2028 12/28/22 0401  NA 140 138  K 4.1 4.1  CL 108 108  CO2 22 23  GLUCOSE 128* 132*  BUN 17 17  CREATININE 0.90 0.81  CALCIUM 7.7* 8.0*    PT/INR:  Recent Labs    12/27/22 1427  LABPROT 18.4*  INR 1.6*   ABG    Component Value Date/Time   PHART 7.334 (L) 12/27/2022 2024   HCO3 22.5 12/27/2022 2024    TCO2 24 12/27/2022 2024   ACIDBASEDEF 3.0 (H) 12/27/2022 2024   O2SAT 99 12/27/2022 2024   CBG (last 3)  Recent Labs    12/28/22 0404 12/28/22 0541 12/28/22 0645  GLUCAP 143* 110* 159*   CXR: ok  ECG pending  Assessment/Plan: S/P Procedure(s) (LRB): BENTALL PROCEDURE USING 27/29MM ON-X ASCENDING AORTIC PROSTHESIS AND 28x10MM HEMASHIELD PLATINUM GRAFT (N/A) THORACIC ASCENDING ANEURYSM REPAIR (AAA) (N/A) TRANSESOPHAGEAL ECHOCARDIOGRAM (TEE) (N/A)  POD 1 Hemodynamically stable in sinus rhythm. Continue Lopressor and increase to 25 bid.  DM: glucose under good control. Transition to Levemir and SSI.  Keep chest tubes today.  DC swan and  arterial line.  IS, OOB.  Will start Coumadin tomorrow for mechanical valve.  Add Reglan and Phenergan for nausea.  Volume excess: Wt is 6 lbs over preop. Start diuresis.   LOS: 1 day    Gaye Pollack 12/28/2022

## 2022-12-28 NOTE — Discharge Summary (Signed)
Physician Discharge Summary       [address]       [place] [id number]             [id number]    Patient ID: Douglas ScalesJonathan R Yarborough MRN: [id number] DOB/AGE: 45/01/1976 45 y.o.  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 Bartle, MD   First Assistant: Gershon CraneWayne Gold,  PA-C:      Preoperative Diagnosis:  Bicuspid aortic valve with 5.2 cm aortic root and ascending aortic aneurysm.     Postoperative Diagnosis:  Same     Procedure:   Median Sternotomy Extracorporeal circulation 3.   Replacement of ascending aorta under deep hypothermic circulatory arrest using a 28 x 10 mm Hemashield Platinum graft 4.   Bentall procedure using a 27/29 mm On-X mechanical valved graft.   Anesthesia:  General Endotracheal         HPI:   The patient is a 47 year old gentleman has a bicuspid aortic valve with mild to moderate AI and normal LV function and a left ventricular diastolic dimension of 5.0 cm on echocardiogram in October 2022.  He also has a 5.2 cm sinus diameter on gated cardiac CTA from 04/02/2022 which is unchanged and a 4.7 cm ascending aorta on CTA of the chest which  is slightly larger than on his prior CTA in October 2022 when it was measured at 4.3 cm. Coronary calcium score was 1592 but FFR was normal in all three coronary vessels in April 2023.  His most recent echo on 10/04/2022 showed a probable bicuspid aortic valve with moderate aortic insufficiency.  The mean gradient across aortic valve was only 4 mmHg.  The aortic root was measured at 4.9 cm with the ascending aorta also measuring 4.9 cm.  Left ventricular ejection fraction was 60 to 65% with grade 2 diastolic dysfunction.  The left ventricular diastolic diameter has increased to 5.5 cm.   He denies any chest pain or shortness of breath.  He denies orthopnea or PND.  He said no peripheral edema.  He saw Dr. Carolan ClinesMary Branch recently.  His main complaint today is related to ocular migraines for which he is seeing Dr. Delena Balihima.  He recently had an MRI of the brain that was unremarkable.  He said that his ocular migraines are brought on by stress.  Dr. Laneta SimmersBartle evaluated the patient and all relevant studies and recommended proceeding with Bentall procedure.  He was admitted this hospitalization electively.   Hospital course  Patient was admitted and taken the operating room at which time he underwent the following procedure: Median Sternotomy Extracorporeal circulation 3.   Replacement of ascending aorta under deep hypothermic circulatory arrest using a 28 x 10 mm Hemashield Platinum graft  4.   Bentall procedure using a 27/29 mm On-X mechanical valved graft.   He tolerated this well and was taken to the surgical ICU in stable condition.  Postoperative hospital course:  Patient was extubated using standard post cardiac surgical protocols without difficulty.  He has remained hemodynamically stable with normal sinus rhythm.  He had moderate chest tube drainage and they were kept in place on postop day #1.  Swan-Ganz and arterial line were removed on postoperative day 1.  He was started on Coumadin for a mechanical  valve on postoperative day #2.  INR is being monitored daily with dosing.  He does have an expected postoperative volume overload and has been started on a course of diuresis.  He has a moderate expected postoperative acute blood loss anemia which is being monitored clinically.  He has not required transfusion.  On postop day #3 he was stable for transfer to [place] for further convalescence;however, a bed was not available. INR increased from 1.6 to 2.8 on 01/19. He was not given Coumadin on 01/19 and ec asa was stopped. He has ben tolerating a diet. He is ambulating on room air with good oxygenation. His wounds are clean, dry, and healing without signs of infection. Once Coumadin dose is able to be determined, he will be surgically stable for discharge.   Latest Vital Signs: Blood pressure 133/72, pulse 81, temperature 98 F (36.7 C), temperature source Oral, resp. rate 19, height 5\' 7"  (1.702 m), weight 109 kg, SpO2 97 %.  Physical Exam:  General appearance: alert, cooperative, and no distress Heart: regular rate and rhythm Lungs: clear to auscultation bilaterally Abdomen: soft, non-tender; bowel sounds normal; no masses,  no organomegaly Extremities: edema trace Wound: clean and dry  Discharge Condition: Stable, home  Recent laboratory studies:  Lab Results  Component Value Date   WBC 7.8 12/31/2022   HGB 9.1 (L) 12/31/2022   HCT 27.3 (L) 12/31/2022   MCV 92.2 12/31/2022   PLT 202 12/31/2022   Lab Results  Component Value Date   NA 136 12/31/2022   K 3.7 12/31/2022   CL 97 (L) 12/31/2022   CO2 30 12/31/2022   CREATININE 0.96 12/31/2022   GLUCOSE 192 (H) 12/31/2022      Diagnostic Studies: DG Chest Port 1 View  Result Date: 12/29/2022 CLINICAL DATA:  Status post AVR EXAM: PORTABLE CHEST 1 VIEW COMPARISON:  Chest x-ray December 28, 2022, chest CT November 22, 2022 FINDINGS: Interval removal of the right approach PA catheter. A right central venous catheter remains in place,  terminating in the low SVC. Stable positioning of mediastinal drains. Stable cardiomediastinal contours status post median sternotomy and aortic valve replacement. Slightly increased left basilar/retrocardiac opacity, likely a combination of consolidation and pleural fluid. No focal opacity in the right lung. No large pneumothorax. No acute osseous abnormality. The visualized upper abdomen is unremarkable. IMPRESSION: 1. Slightly increased left basilar/retrocardiac opacity, likely atelectasis and pleural fluid. Superimposed infection is not excluded. 2. Unchanged cardiomegaly. Electronically Signed   By: Beryle Flock M.D.   On: 12/29/2022 08:33   EP STUDY  Result Date: 12/28/2022 See surgical note for result.  DG Chest Port 1 View  Result Date: 12/28/2022 CLINICAL DATA:  Aortic aneurysm. EXAM: PORTABLE CHEST 1 VIEW COMPARISON:  Chest x-ray dated 12/27/2022. FINDINGS: Endotracheal tube and enteric tube have been removed. Swan-Ganz catheter is in place with tip just to the RIGHT of midline. Mediastinal drains in place. Median sternotomy wires appear intact and stable in  alignment. Probable mild atelectasis and/or small pleural effusion at the LEFT lung base. Lungs otherwise clear. No pneumothorax is seen. IMPRESSION: 1. Probable mild atelectasis and/or small pleural effusion at the LEFT lung base. Lungs otherwise clear. No pneumothorax. 2. Support apparatus in place, as above. 3. Endotracheal tube and enteric tube have been removed. Electronically Signed   By: Franki Cabot M.D.   On: 12/28/2022 08:15   ECHO INTRAOPERATIVE TEE  Result Date: 12/27/2022  *INTRAOPERATIVE TRANSESOPHAGEAL REPORT *  Patient Name:   JYREN CERASOLI Date of Exam: 12/27/2022 Medical Rec #:  [id number]          Height:       67.0 in Accession #:    [id number]         Weight:       245.0 lb Date of Birth:  [birth year]           BSA:          2.20 m Patient Age:    46 years           BP:           124/76 mmHg Patient Gender: M                   HR:           48 bpm. Exam Location:  Anesthesiology Transesophogeal exam was perform intraoperatively during surgical procedure. Patient was closely monitored under general anesthesia during the entirety of examination. Indications:     AV insufficiency/Ascending aortic aneurysm Performing Phys: Adele Barthel, MD Complications: No known complications during this procedure. POST-OP IMPRESSIONS _ Left Ventricle: The left ventricle is unchanged from pre-bypass. _ Right Ventricle: The right ventricle appears unchanged from pre-bypass. _ Aorta: A graft was placed in the ascending aorta for repair. _ Left Atrial Appendage: The left atrial appendage appears unchanged from pre-bypass. _ Aortic Valve: No stenosis present. There is trivial regurgitation. The gradient recorded across the prosthetic valve is within the expected range. No perivalvular leak noted. _ Mitral Valve: The mitral valve appears unchanged from pre-bypass. _ Tricuspid Valve: The tricuspid valve appears unchanged from pre-bypass. _ Pulmonic Valve: The pulmonic valve appears unchanged from pre-bypass. _ Interatrial Septum: The interatrial septum appears unchanged from pre-bypass. _ Pericardium: The pericardium appears unchanged from pre-bypass. PRE-OP FINDINGS  Left Ventricle: The left ventricle has normal systolic function, with an ejection fraction of 55-60%. The cavity size was normal. There is no increase in left ventricular wall thickness. Right Ventricle: The right ventricle has normal systolic function. The cavity was normal. There is no increase in right ventricular wall thickness. Left Atrium: Left atrial size was normal in size. No left atrial/left atrial appendage thrombus was detected. Right Atrium: Right atrial size was normal in size. Interatrial Septum: No atrial level shunt detected by color flow Doppler. Pericardium: There is no evidence of pericardial effusion. Mitral Valve: The mitral valve is normal in structure. Mitral  valve regurgitation is trivial by color flow Doppler. Tricuspid Valve: The tricuspid valve was normal in structure. Tricuspid valve regurgitation was not visualized by color flow Doppler. Aortic Valve: The aortic valve is bicuspid. Aortic valve regurgitation is mild by color flow Doppler. The jet is eccentric and directed posteriorly. There is no stenosis of the aortic valve. Pulmonic Valve: The pulmonic valve was normal in structure. Pulmonic valve regurgitation is not visualized by color flow Doppler. Aorta: The aortic arch and descending aorta are normal in size and structure. There is an  aneurysm involving the aortic root measuring 51 mm. There is an aneurysm involving the ascending aorta measuring 42 mm.  Karna Christmasyan Ellender MD Electronically signed by Karna Christmasyan Ellender MD Signature Date/Time: 12/27/2022/3:00:08 PM    Final    DG Chest Port 1 View  Result Date: 12/27/2022 CLINICAL DATA:  Postoperative day 0, Bentall procedure for thoracic aortic aneurysm. EXAM: PORTABLE CHEST 1 VIEW COMPARISON:  12/23/2022 FINDINGS: The endotracheal tube is 0.9 cm above the carina; consider retracting 2 cm for safety. Nasogastric tube enters the stomach. Mediastinal drains noted. Aortic valve prosthesis observed. Moderate enlargement of the cardiopericardial silhouette noted. Indistinct pulmonary vasculature may reflect pulmonary venous hypertension. No overt pulmonary edema. Minimal hazy retrocardiac density on the left, favoring atelectasis in the left lower lobe. No pneumothorax.  Equivocal trace pneumomediastinum. IMPRESSION: 1. Moderate enlargement of the cardiopericardial silhouette with pulmonary venous hypertension but no overt edema. 2. The endotracheal tube is 0.9 cm above the carina; consider retracting 2 cm for safety. 3. Mediastinal drains and aortic valve prosthesis noted. 4. Equivocal trace pneumomediastinum. 5. Mild hazy retrocardiac density on the left, favoring atelectasis in the left lower lobe. Electronically  Signed   By: Gaylyn RongWalter  Liebkemann M.D.   On: 12/27/2022 14:43   DG Chest 2 View  Result Date: 12/24/2022 CLINICAL DATA:  Preoperative chest x-ray EXAM: CHEST - 2 VIEW COMPARISON:  None Available. FINDINGS: The cardiac silhouette is enlarged. The hila and mediastinum are normal. No pneumothorax. No nodules or masses. No focal infiltrates. IMPRESSION: The cardiac silhouette is enlarged. No other abnormalities. Electronically Signed   By: Gerome Samavid  Williams III M.D.   On: 12/24/2022 15:51   VAS US DOPPLER PRE CABG  Result Date: 12/23/2022 PREOPERATIVE VASCULAR EVALUATION Patient Name:  Douglas ScalesJONATHAN R Miotke  Date of Exam:   12/23/2022 Medical Rec #: [id number]           Accession #:    [id number] Date of Birth: 11/05/1976            Patient Gender: M Patient Age:   [age] years [facility] Procedure:      VAS US DOPPLER PRE CABG Referring Phys: Evelene CroonBRYAN BARTLE --------------------------------------------------------------------------------  Indications:      Pre-CABG. Risk Factors:     Hyperlipidemia, Diabetes. Other Factors:    Bicuspid aortic valve, dilated aortic root, pre operative for                   Aortic valve replacement and Bentall procedure. Comparison Study: No prior study Performing Technologist: Sherren Kernsandace Kanady RVS  Examination Guidelines: A complete evaluation includes B-mode imaging, spectral Doppler, color Doppler, and power Doppler as needed of all accessible portions of each vessel. Bilateral testing is considered an integral part of a complete examination. Limited examinations for reoccurring indications may be performed as noted.  Right Carotid Findings: +----------+--------+--------+--------+--------+------------------+           PSV cm/sEDV cm/sStenosisDescribeComments           +----------+--------+--------+--------+--------+------------------+ CCA Prox  97      18                      intimal thickening  +----------+--------+--------+--------+--------+------------------+ CCA Distal74      17                      intimal thickening +----------+--------+--------+--------+--------+------------------+ ICA Prox  59      20                                         +----------+--------+--------+--------+--------+------------------+  ICA Mid   78      22                                         +----------+--------+--------+--------+--------+------------------+ ECA       84      12                                         +----------+--------+--------+--------+--------+------------------+ +----------+--------+-------+--------+------------+           PSV cm/sEDV cmsDescribeArm Pressure +----------+--------+-------+--------+------------+ Subclavian121                                 +----------+--------+-------+--------+------------+ +---------+--------+--+--------+--+ VertebralPSV cm/s57EDV cm/s18 +---------+--------+--+--------+--+ Left Carotid Findings: +----------+--------+--------+--------+--------+------------------+           PSV cm/sEDV cm/sStenosisDescribeComments           +----------+--------+--------+--------+--------+------------------+ CCA Prox  99      13                      intimal thickening +----------+--------+--------+--------+--------+------------------+ CCA Distal63      17                      intimal thickening +----------+--------+--------+--------+--------+------------------+ ICA Prox  67      28                                         +----------+--------+--------+--------+--------+------------------+ ICA Mid   84      27                                         +----------+--------+--------+--------+--------+------------------+ ICA Distal60      20                                         +----------+--------+--------+--------+--------+------------------+ ECA       75      14                                          +----------+--------+--------+--------+--------+------------------+  +----------+--------+--------+--------+------------+ SubclavianPSV cm/sEDV cm/sDescribeArm Pressure +----------+--------+--------+--------+------------+           107                                  +----------+--------+--------+--------+------------+ +---------+--------+--+--------+--+ VertebralPSV cm/s50EDV cm/s12 +---------+--------+--+--------+--+  Right Doppler Findings: +------+--------+-----+---------+--------+ Site  PressureIndexDoppler  Comments +------+--------+-----+---------+--------+ Radial             triphasic         +------+--------+-----+---------+--------+ Ulnar              triphasic         +------+--------+-----+---------+--------+  Left Doppler Findings: +------+--------+-----+---------+--------+ Site  PressureIndexDoppler  Comments +------+--------+-----+---------+--------+ Radial  triphasic         +------+--------+-----+---------+--------+ Ulnar              triphasic         +------+--------+-----+---------+--------+   Summary: Right Carotid: The extracranial vessels were near-normal with only minimal wall                thickening or plaque. Left Carotid: The extracranial vessels were near-normal with only minimal wall               thickening or plaque. Vertebrals:  Bilateral vertebral arteries demonstrate antegrade flow. Subclavians: Normal flow hemodynamics were seen in bilateral subclavian              arteries. Right Upper Extremity: Doppler waveforms remain within normal limits with right radial compression. Doppler waveforms remain within normal limits with right ulnar compression. Left Upper Extremity: Doppler waveforms remain within normal limits with left radial compression. Doppler waveforms remain within normal limits with left ulnar compression.  Electronically signed by Heath Larkhomas Hawken on 12/23/2022 at 4:45:47 PM.    Final    CARDIAC  CATHETERIZATION  Result Date: 12/17/2022   Dist RCA-1 lesion is 40% stenosed.   Dist RCA-2 lesion is 30% stenosed.   Prox LAD lesion is 50% stenosed.   Dist LAD lesion is 80% stenosed. 1.  Moderate diffuse coronary artery disease with a high-grade lesion of the distal LAD; of note this is a relatively small vessel disease that does not seem to perfuse the apex. 2.  Fick cardiac output of 13.1 L/min, and Fick cardiac index of 5.9 L/min/m with mean RA pressure of 8 mmHg, mean wedge pressure of 15 mmHg, and mean PA pressure of 23 mmHg. 3.  LVEDP of 20 mmHg. Recommendation: Surgical plan per cardiothoracic surgery.       Discharge Instructions     Amb Referral to Cardiac Rehabilitation   Complete by: As directed    Diagnosis: Other   After initial evaluation and assessments completed: Virtual Based Care may be provided alone or in conjunction with Phase 2 Cardiac Rehab based on patient barriers.: Yes   Intensive Cardiac Rehabilitation (ICR) [facility] location only OR Traditional Cardiac Rehabilitation (TCR) *If criteria for ICR are not met will enroll in TCR [facility] only): Yes       Discharge Medications: Allergies as of 01/01/2023   No Known Allergies      Medication List     STOP taking these medications    Goodys Extra Strength 500-325-65 MG Pack Generic drug: Aspirin-Acetaminophen-Caffeine       TAKE these medications    acetaminophen 500 MG tablet Commonly known as: TYLENOL Take 2 tablets (1,000 mg total) by mouth every 6 (six) hours as needed for mild pain or fever.   atorvastatin 40 MG tablet Commonly known as: LIPITOR Take 1 tablet (40 mg total) by mouth daily. What changed: when to take this   blood glucose meter kit and supplies Dispense based on patient and insurance preference. Use one time daily. ICD10: E11.65   fenofibrate 160 MG tablet Take 160 mg by mouth in the morning.   glipiZIDE 5 MG tablet Commonly known as: GLUCOTROL Take 5 mg by mouth at bedtime.    Gvoke HypoPen 1-Pack 1 MG/0.2ML Soaj Generic drug: Glucagon Inject 1 mg into the skin daily as needed (hypoglycemia).   lamoTRIgine 25 MG tablet Commonly known as: LaMICtal Take 1 pill at bedtime for 2 weeks. Then take 1 pill twice a day for 2  weeks. Then take 1 pill in AM and 2 pills in PM for one week. Then take 2 pills twice a day for one week. Then take 2 pills in AM and 3 pills in PM for one week. Then take 3 pills twice a day for one week. Then take 3 pills in AM and 4 pills in PM for one week. Then take 4 pills twice a day until pills are gone. Then start 100 mg tablets.   lamoTRIgine 100 MG tablet Commonly known as: LaMICtal Take 1 tablet (100 mg total) by mouth 2 (two) times daily. Start taking on: January 27, 2023   metoprolol tartrate 25 MG tablet Commonly known as: LOPRESSOR Take 0.5 tablets (12.5 mg total) by mouth 2 (two) times daily.   NovoLIN 70/30 Kwikpen (70-30) 100 UNIT/ML KwikPen Generic drug: insulin isophane & regular human KwikPen Inject 18 Units into the skin in the morning and at bedtime.   OneTouch Verio test strip Generic drug: glucose blood 1 each by Other route in the morning and at bedtime. Use as instructed   oxyCODONE 5 MG immediate release tablet Commonly known as: Oxy IR/ROXICODONE Take 1 tablet (5 mg total) by mouth every 4 (four) hours as needed for severe pain.   pioglitazone 15 MG tablet Commonly known as: ACTOS Take 15 mg by mouth in the morning.   Prostaglandin E1 Powd daily as needed (ED).   VITAMIN D PO Take 2,000 Units by mouth in the morning.   warfarin 2.5 MG tablet Commonly known as: Coumadin Take 1 tablet (2.5 mg total) by mouth daily.   Zenpep 40000-126000 units Cpep Generic drug: Pancrelipase (Lip-Prot-Amyl) Take 2 capsules by mouth with breakfast, with lunch, and with evening meal.        Follow Up Appointments:  Follow-up Information     Alleen Borne, MD. Go on 01/26/2023.   Specialty: Cardiothoracic  Surgery Why: Your appointment is at 12:30pm. Please obtain a chest x-ray on the date of appointment 1-hour prior to seeing the Dr. Laneta Simmers.  Will be at [facility] located at [address] [facility]. Contact information: [address] [place] [address] [phone]         [facility]. Go on 01/04/2023.   Specialty: Cardiothoracic Surgery Why: Your appointment is at 10:30am for suture removal. Contact information: [address] [place], [address] [place] [address] [phone]               The patient has been discharged on:   1.Beta Blocker:  Yes Arly.Keller   ]                              No   [   ]                              If No, reason:  2.Ace Inhibitor/ARB: Yes [   ]                                     No  [   X ]  If No, reason: labile BP  3.Statin:   Yes [ X  ]                  No  [   ]                  If No, reason:  4.Ecasa:  Yes  [   ]                  No   [  X ]                  If No, reason: on Coumadin  Note by Jillyn Hidden PA-C   Signed: Carl Best 01/01/2023, 8:17 AM

## 2022-12-29 ENCOUNTER — Inpatient Hospital Stay (HOSPITAL_COMMUNITY): Payer: BC Managed Care – PPO

## 2022-12-29 LAB — GLUCOSE, CAPILLARY
Glucose-Capillary: 162 mg/dL — ABNORMAL HIGH (ref 70–99)
Glucose-Capillary: 189 mg/dL — ABNORMAL HIGH (ref 70–99)
Glucose-Capillary: 177 mg/dL — ABNORMAL HIGH (ref 70–99)
Glucose-Capillary: 140 mg/dL — ABNORMAL HIGH (ref 70–99)
Glucose-Capillary: 163 mg/dL — ABNORMAL HIGH (ref 70–99)

## 2022-12-29 LAB — CBC
HCT: 26.4 % — ABNORMAL LOW (ref 39.0–52.0)
Hemoglobin: 8.9 g/dL — ABNORMAL LOW (ref 13.0–17.0)
MCH: 31.3 pg (ref 26.0–34.0)
MCHC: 33.7 g/dL (ref 30.0–36.0)
MCV: 93 fL (ref 80.0–100.0)
Platelets: 152 10*3/uL (ref 150–400)
RBC: 2.84 MIL/uL — ABNORMAL LOW (ref 4.22–5.81)
RDW: 12.8 % (ref 11.5–15.5)
WBC: 12.6 10*3/uL — ABNORMAL HIGH (ref 4.0–10.5)
nRBC: 0 % (ref 0.0–0.2)

## 2022-12-29 LAB — BASIC METABOLIC PANEL
Anion gap: 8 (ref 5–15)
BUN: 23 mg/dL — ABNORMAL HIGH (ref 6–20)
CO2: 28 mmol/L (ref 22–32)
Calcium: 8.6 mg/dL — ABNORMAL LOW (ref 8.9–10.3)
Chloride: 102 mmol/L (ref 98–111)
Creatinine, Ser: 1.07 mg/dL (ref 0.61–1.24)
GFR, Estimated: >60 mL/min (ref >60)
Glucose, Bld: 161 mg/dL — ABNORMAL HIGH (ref 70–99)
Potassium: 4.2 mmol/L (ref 3.5–5.1)
Sodium: 138 mmol/L (ref 135–145)

## 2022-12-29 LAB — TYPE AND SCREEN
ABO/RH(D): A POS
Antibody Screen: NEGATIVE
Unit division: 0
Unit division: 0
Unit division: 0
Unit division: 0

## 2022-12-29 LAB — BPAM RBC
Blood Product Expiration Date: 202402112359
Blood Product Expiration Date: 202402112359
Blood Product Expiration Date: 202402112359
Blood Product Expiration Date: 202402112359
ISSUE DATE / TIME: 202401151354
ISSUE DATE / TIME: 202401151609
Unit Type and Rh: 6200
Unit Type and Rh: 6200
Unit Type and Rh: 6200
Unit Type and Rh: 6200

## 2022-12-29 LAB — PROTIME-INR
INR: 1.2 (ref 0.8–1.2)
Prothrombin Time: 15.2 seconds (ref 11.4–15.2)

## 2022-12-29 MED ORDER — INSULIN ASPART 100 UNIT/ML IJ SOLN: 0.0000 [IU] | Freq: Three times a day (TID) | INTRAMUSCULAR | Status: DC

## 2022-12-29 MED ORDER — PNEUMOCOCCAL 20-VAL CONJ VACC 0.5 ML IM SUSY
0.5000 mL | PREFILLED_SYRINGE | INTRAMUSCULAR | Status: AC
  Administered 2022-12-30: 0.5 mL via INTRAMUSCULAR
  Filled 2022-12-29: qty 0.5

## 2022-12-29 MED ORDER — INSULIN ASPART 100 UNIT/ML IJ SOLN
0.0000 [IU] | Freq: Three times a day (TID) | INTRAMUSCULAR | Status: DC
  Administered 2022-12-29 (×2): 4 [IU] via SUBCUTANEOUS
  Administered 2022-12-29: 2 [IU] via SUBCUTANEOUS
  Administered 2022-12-30 (×2): 4 [IU] via SUBCUTANEOUS
  Administered 2022-12-30 – 2022-12-31 (×2): 2 [IU] via SUBCUTANEOUS
  Administered 2022-12-31 – 2023-01-01 (×3): 4 [IU] via SUBCUTANEOUS

## 2022-12-29 MED ORDER — WARFARIN SODIUM 5 MG PO TABS
5.0000 mg | ORAL_TABLET | Freq: Once | ORAL | Status: AC
  Administered 2022-12-29: 5 mg via ORAL
  Filled 2022-12-29: qty 1

## 2022-12-29 MED ORDER — METOCLOPRAMIDE HCL 5 MG/ML IJ SOLN
10.0000 mg | Freq: Four times a day (QID) | INTRAMUSCULAR | Status: DC
  Administered 2022-12-29 (×3): 10 mg via INTRAVENOUS
  Filled 2022-12-29 (×2): qty 2

## 2022-12-29 MED ORDER — WARFARIN - PHYSICIAN DOSING INPATIENT: Freq: Every day | Status: DC

## 2022-12-29 NOTE — Progress Notes (Addendum)
[facility] Progress Note: 2 Days Post-Op Procedure(s) (LRB): BENTALL PROCEDURE USING 27/29MM ON-X ASCENDING AORTIC PROSTHESIS AND 28x10MM HEMASHIELD PLATINUM GRAFT (N/A) THORACIC ASCENDING ANEURYSM REPAIR (AAA) (N/A) TRANSESOPHAGEAL ECHOCARDIOGRAM (TEE) (N/A)  LOS: 2 days    Did 5 laps today  Removed wires, tubes, foley, cordis today Only PIVs left in  Sinus 70s  Plan:cont current care.  5mg  coumadin already ordered for tonight Likely floor tomorrow    Latest Ref Rng & Units 12/29/2022    4:47 AM 12/28/2022    4:21 PM 12/28/2022    4:01 AM  CBC  WBC 4.0 - 10.5 K/uL 12.6  14.1  11.4   Hemoglobin 13.0 - 17.0 g/dL 8.9  9.4  9.5   Hematocrit 39.0 - 52.0 % 26.4  28.2  27.3   Platelets 150 - 400 K/uL 152  175  156        Latest Ref Rng & Units 12/29/2022    4:47 AM 12/28/2022    4:21 PM 12/28/2022    4:01 AM  CMP  Glucose 70 - 99 mg/dL 161  202  132   BUN 6 - 20 mg/dL 23  18  17    Creatinine 0.61 - 1.24 mg/dL 1.07  0.92  0.81   Sodium 135 - 145 mmol/L 138  136  138   Potassium 3.5 - 5.1 mmol/L 4.2  4.2  4.1   Chloride 98 - 111 mmol/L 102  102  108   CO2 22 - 32 mmol/L 28  26  23    Calcium 8.9 - 10.3 mg/dL 8.6  8.0  8.0     ABG    Component Value Date/Time   PHART 7.334 (L) 12/27/2022 2024   PCO2ART 42.2 12/27/2022 2024   PO2ART 137 (H) 12/27/2022 2024   HCO3 22.5 12/27/2022 2024   TCO2 24 12/27/2022 2024   ACIDBASEDEF 3.0 (H) 12/27/2022 2024   O2SAT 99 12/27/2022 2024

## 2022-12-29 NOTE — Discharge Instructions (Addendum)
Discharge Instructions:  1. You may shower, please wash incisions daily with soap and water and keep dry.  If you wish to cover wounds with dressing you may do so but please keep clean and change daily.  No tub baths or swimming until incisions have completely healed.  If your incisions become red or develop any drainage please call our office at [phone]  2. No Driving until cleared by Dr. Bartle's office and you are no longer using narcotic pain medications  3. Monitor your weight daily.. Please use the same scale and weigh at same time... If you gain 5-10 lbs in 48 hours with associated lower extremity swelling, please contact our office at [phone]  4. Fever of 101.5 for at least 24 hours with no source, please contact our office at [phone]  5. Activity- up as tolerated, please walk at least 3 times per day.  Avoid strenuous activity, no lifting, pushing, or pulling with your arms over 8-10 lbs for a minimum of 6 weeks  6. If any questions or concerns arise, please do not hesitate to contact our office at [phone]    Information on my medicine - Coumadin   (Warfarin)  Why was Coumadin prescribed for you? Coumadin was prescribed for you because you have a blood clot or a medical condition that can cause an increased risk of forming blood clots. Blood clots can cause serious health problems by blocking the flow of blood to the heart, lung, or brain. Coumadin can prevent harmful blood clots from forming. As a reminder your indication for Coumadin is:  Blood Clot Prevention after Heart Valve Surgery  What test will check on my response to Coumadin? While on Coumadin (warfarin) you will need to have an INR test regularly to ensure that your dose is keeping you in the desired range. The INR (international normalized ratio) number is calculated from the result of the laboratory test called prothrombin time (PT).  If an INR APPOINTMENT HAS NOT ALREADY BEEN MADE FOR YOU please  schedule an appointment to have this lab work done by your health care provider within 7 days. Your INR goal is usually a number between:  2 to 3 or your provider may give you a more narrow range like 2-2.5.  Ask your health care provider during an office visit what your goal INR is.  What  do you need to  know  About  COUMADIN? Take Coumadin (warfarin) exactly as prescribed by your healthcare provider about the same time each day.  DO NOT stop taking without talking to the doctor who prescribed the medication.  Stopping without other blood clot prevention medication to take the place of Coumadin may increase your risk of developing a new clot or stroke.  Get refills before you run out.  What do you do if you miss a dose? If you miss a dose, take it as soon as you remember on the same day then continue your regularly scheduled regimen the next day.  Do not take two doses of Coumadin at the same time.  Important Safety Information A possible side effect of Coumadin (Warfarin) is an increased risk of bleeding. You should call your healthcare provider right away if you experience any of the following: Bleeding from an injury or your nose that does not stop. Unusual colored urine (red or dark brown) or unusual colored stools (red or black). Unusual bruising for unknown reasons. A serious fall or if you hit your head (even if there is no   bleeding).  Some foods or medicines interact with Coumadin (warfarin) and might alter your response to warfarin. To help avoid this: Eat a balanced diet, maintaining a consistent amount of Vitamin K. Notify your provider about major diet changes you plan to make. Avoid alcohol or limit your intake to 1 drink for women and 2 drinks for men per day. (1 drink is 5 oz. wine, 12 oz. beer, or 1.5 oz. liquor.)  Make sure that ANY health care provider who prescribes medication for you knows that you are taking Coumadin (warfarin).  Also make sure the healthcare provider  who is monitoring your Coumadin knows when you have started a new medication including herbals and non-prescription products.  Coumadin (Warfarin)  Major Drug Interactions  Increased Warfarin Effect Decreased Warfarin Effect  Alcohol (large quantities) Antibiotics (esp. Septra/Bactrim, Flagyl, Cipro) Amiodarone (Cordarone) Aspirin (ASA) Cimetidine (Tagamet) Megestrol (Megace) NSAIDs (ibuprofen, naproxen, etc.) Piroxicam (Feldene) Propafenone (Rythmol SR) Propranolol (Inderal) Isoniazid (INH) Posaconazole (Noxafil) Barbiturates (Phenobarbital) Carbamazepine (Tegretol) Chlordiazepoxide (Librium) Cholestyramine (Questran) Griseofulvin Oral Contraceptives Rifampin Sucralfate (Carafate) Vitamin K   Coumadin (Warfarin) Major Herbal Interactions  Increased Warfarin Effect Decreased Warfarin Effect  Garlic Ginseng Ginkgo biloba Coenzyme Q10 Green tea St. John's wort    Coumadin (Warfarin) FOOD Interactions  Eat a consistent number of servings per week of foods HIGH in Vitamin K (1 serving =  cup)  Collards (cooked, or boiled & drained) Kale (cooked, or boiled & drained) Mustard greens (cooked, or boiled & drained) Parsley *serving size only =  cup Spinach (cooked, or boiled & drained) Swiss chard (cooked, or boiled & drained) Turnip greens (cooked, or boiled & drained)  Eat a consistent number of servings per week of foods MEDIUM-HIGH in Vitamin K (1 serving = 1 cup)  Asparagus (cooked, or boiled & drained) Broccoli (cooked, boiled & drained, or raw & chopped) Brussel sprouts (cooked, or boiled & drained) *serving size only =  cup Lettuce, raw (green leaf, endive, romaine) Spinach, raw Turnip greens, raw & chopped   These websites have more information on Coumadin (warfarin):  www.coumadin.com; www.ahrq.gov/consumer/coumadin.htm;   

## 2022-12-29 NOTE — Progress Notes (Signed)
Pt placed on CPAP, home unit on home settings.

## 2022-12-29 NOTE — TOC Initial Note (Signed)
Transition of Care [facility]) - Initial/Assessment Note    Patient Details  Name: Douglas Phillips MRN: [id number] Date of Birth: 1976/05/01  [facility]) CM/SW Contact:    Erenest Rasher, RN Phone Number: [phone] 12/29/2022, 11:20 AM  Clinical Narrative:                  CM spoke to wife at bedside. Pt was independent PTA. Will continue to follow for dc needs post surgery.    Expected Discharge Plan: Home/Self Care Barriers to Discharge: Continued Medical Work up   Patient Goals and CMS Choice Patient states their goals for this hospitalization and ongoing recovery are:: wants pt to recover CMS Medicare.gov Compare Post Acute Care list provided to:: Patient Represenative (must comment) (wife)        Expected Discharge Plan and Services       Living arrangements for the past 2 months: Single Family Home                                      Prior Living Arrangements/Services Living arrangements for the past 2 months: Single Family Home Lives with:: Spouse Patient language and need for interpreter reviewed:: Yes        Need for Family Participation in Patient Care: No (Comment) Care giver support system in place?: Yes (comment)      Activities of Daily Living      Permission Sought/Granted Permission sought to share information with : Case Manager Permission granted to share information with : Yes, Verbal Permission Granted  Share Information with NAME: Clevon Khader     Permission granted to share info w Relationship: wife  Permission granted to share info w Contact Information: [phone]  Emotional Assessment Appearance:: Appears stated age Attitude/Demeanor/Rapport: Lethargic       Psych Involvement: No (comment)  Admission diagnosis:  Aortic aneurysm, thoracic ([place]) [I71.20] Patient Active Problem List   Diagnosis Date Noted   Aortic aneurysm, thoracic ([place]) 12/27/2022   Coronary artery disease involving native heart  without angina pectoris 05/13/2021   Nocturia more than twice per night 03/06/2021   Erectile dysfunction 03/06/2021   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) 02/01/2019   Hyperlipidemia 02/01/2019   Migraine 02/01/2019   OSA (obstructive sleep apnea) 01/22/2010   NEPHROLITHIASIS, HX OF 04/05/2007   PCP:  Guadlupe Spanish, MD Pharmacy:   [facility] [phone] AT [facility] [address] [place] [address] Phone: [phone] Fax: [phone]  [facility] ([address] [place] [place] [address] Phone: [phone] Fax: [phone]     Social Determinants of Health (SDOH) Social History: SDOH Screenings   Depression (PHQ2-9): Low Risk  (10/07/2021)  Tobacco Use: Low Risk  (12/28/2022)   SDOH Interventions:     Readmission Risk Interventions     No data to display

## 2022-12-29 NOTE — Progress Notes (Signed)
2 Days Post-Op Procedure(s) (LRB): BENTALL PROCEDURE USING 27/29MM ON-X ASCENDING AORTIC PROSTHESIS AND 28x10MM HEMASHIELD PLATINUM GRAFT (N/A) THORACIC ASCENDING ANEURYSM REPAIR (AAA) (N/A) TRANSESOPHAGEAL ECHOCARDIOGRAM (TEE) (N/A) Subjective: Up in chair. Feels better today. Nausea improved. Ambulated last night and this am without difficulty.  Objective: Vital signs in last 24 hours: Temp:  [97.9 F (36.6 C)-98.6 F (37 C)] 98.5 F (36.9 C) (01/16 2300) Pulse Rate:  [61-88] 68 (01/17 0600) Cardiac Rhythm: Normal sinus rhythm (01/17 0000) Resp:  [11-24] 15 (01/17 0600) BP: (88-128)/(49-70) 123/53 (01/17 0600) SpO2:  [94 %-100 %] 94 % (01/17 0600) Arterial Line BP: (125-176)/(51-60) 151/60 (01/16 1330) Weight:  [111.1 kg] 111.1 kg (01/17 0700)  Hemodynamic parameters for last 24 hours: PAP: (9-26)/(3-4) 26/3 CO:  [7.1 L/min] 7.1 L/min CI:  [3.2 L/min/m2] 3.2 L/min/m2  Intake/Output from previous day: 01/16 0701 - 01/17 0700 In: 1031.5 [I.V.:360.2; IV Piggyback:671.3] Out: 6144 [Urine:3030; Chest Tube:290] Intake/Output this shift: No intake/output data recorded.  General appearance: alert and cooperative Neurologic: intact Heart: regular rate and rhythm and crisp mechanical valve click. Lungs: clear to auscultation bilaterally Abdomen: soft, non-tender; bowel sounds normal; no masses,  no organomegaly Extremities: edema minimal Wound: dressing dry  Lab Results: Recent Labs    12/28/22 1621 12/29/22 0447  WBC 14.1* 12.6*  HGB 9.4* 8.9*  HCT 28.2* 26.4*  PLT 175 152   BMET:  Recent Labs    12/28/22 1621 12/29/22 0447  NA 136 138  K 4.2 4.2  CL 102 102  CO2 26 28  GLUCOSE 202* 161*  BUN 18 23*  CREATININE 0.92 1.07  CALCIUM 8.0* 8.6*    PT/INR:  Recent Labs    12/29/22 0447  LABPROT 15.2  INR 1.2   ABG    Component Value Date/Time   PHART 7.334 (L) 12/27/2022 2024   HCO3 22.5 12/27/2022 2024   TCO2 24 12/27/2022 2024   ACIDBASEDEF 3.0 (H)  12/27/2022 2024   O2SAT 99 12/27/2022 2024   CBG (last 3)  Recent Labs    12/28/22 2015 12/28/22 2356 12/29/22 0455  GLUCAP 184* 173* 162*   CXR: left base atelectasis  Assessment/Plan: S/P Procedure(s) (LRB): BENTALL PROCEDURE USING 27/29MM ON-X ASCENDING AORTIC PROSTHESIS AND 28x10MM HEMASHIELD PLATINUM GRAFT (N/A) THORACIC ASCENDING ANEURYSM REPAIR (AAA) (N/A) TRANSESOPHAGEAL ECHOCARDIOGRAM (TEE) (N/A)  POD 2 Hemodynamically stable in sinus rhythm. Continue Lopressor.  Volume excess: back to baseline wt. Hold off on further diuresis for now.  DM: glucose under adequate control. Continue Levemir and SSI.  DC pacing wires then chest tubes 4 hrs later.  DC sleeve and foley today.  Continue IS, ambulation  Will start Coumadin 5 mg today.   LOS: 2 days    Douglas Phillips 12/29/2022

## 2022-12-30 LAB — GLUCOSE, CAPILLARY
Glucose-Capillary: 164 mg/dL — ABNORMAL HIGH (ref 70–99)
Glucose-Capillary: 155 mg/dL — ABNORMAL HIGH (ref 70–99)
Glucose-Capillary: 181 mg/dL — ABNORMAL HIGH (ref 70–99)
Glucose-Capillary: 208 mg/dL — ABNORMAL HIGH (ref 70–99)

## 2022-12-30 LAB — PROTIME-INR
INR: 1.6 — ABNORMAL HIGH (ref 0.8–1.2)
Prothrombin Time: 18.7 seconds — ABNORMAL HIGH (ref 11.4–15.2)

## 2022-12-30 MED ORDER — SODIUM CHLORIDE 0.9 % IV SOLN: 250.0000 mL | INTRAVENOUS | Status: DC | PRN

## 2022-12-30 MED ORDER — METOPROLOL TARTRATE 12.5 MG HALF TABLET
12.5000 mg | ORAL_TABLET | Freq: Two times a day (BID) | ORAL | Status: DC
  Administered 2022-12-30 – 2023-01-01 (×4): 12.5 mg via ORAL
  Filled 2022-12-30 (×4): qty 1

## 2022-12-30 MED ORDER — WARFARIN SODIUM 2.5 MG PO TABS
2.5000 mg | ORAL_TABLET | Freq: Once | ORAL | Status: AC
  Administered 2022-12-30: 2.5 mg via ORAL
  Filled 2022-12-30: qty 1

## 2022-12-30 MED ORDER — CHLORHEXIDINE GLUCONATE CLOTH 2 % EX PADS
6.0000 | MEDICATED_PAD | Freq: Every day | CUTANEOUS | Status: DC
  Administered 2022-12-31: 6 via TOPICAL

## 2022-12-30 MED ORDER — SODIUM CHLORIDE 0.9% FLUSH: 3.0000 mL | INTRAVENOUS | Status: DC | PRN

## 2022-12-30 MED ORDER — POLYETHYLENE GLYCOL 3350 17 G PO PACK
17.0000 g | PACK | Freq: Every day | ORAL | Status: DC
  Filled 2022-12-30 (×2): qty 1

## 2022-12-30 MED ORDER — SENNOSIDES-DOCUSATE SODIUM 8.6-50 MG PO TABS: 1.0000 | ORAL_TABLET | Freq: Two times a day (BID) | ORAL | Status: DC | PRN

## 2022-12-30 MED ORDER — SODIUM CHLORIDE 0.9% FLUSH
3.0000 mL | Freq: Two times a day (BID) | INTRAVENOUS | Status: DC
  Administered 2022-12-30 – 2023-01-01 (×4): 3 mL via INTRAVENOUS

## 2022-12-30 MED ORDER — ASPIRIN 81 MG PO TBEC: 81.0000 mg | DELAYED_RELEASE_TABLET | Freq: Every day | ORAL | Status: DC

## 2022-12-30 MED ORDER — ~~LOC~~ CARDIAC SURGERY, PATIENT & FAMILY EDUCATION: Freq: Once | Status: AC

## 2022-12-30 NOTE — Progress Notes (Signed)
3 Days Post-Op Procedure(s) (LRB): BENTALL PROCEDURE USING 27/29MM ON-X ASCENDING AORTIC PROSTHESIS AND 28x10MM HEMASHIELD PLATINUM GRAFT (N/A) THORACIC ASCENDING ANEURYSM REPAIR (AAA) (N/A) TRANSESOPHAGEAL ECHOCARDIOGRAM (TEE) (N/A) Subjective: No complaints. Walking well. Passing gas but no BM yet.  Objective: Vital signs in last 24 hours: Temp:  [98.2 F (36.8 C)-98.8 F (37.1 C)] 98.7 F (37.1 C) (01/18 0630) Pulse Rate:  [66-83] 78 (01/18 0600) Cardiac Rhythm: Normal sinus rhythm (01/17 1945) Resp:  [15-22] 19 (01/18 0600) BP: (95-131)/(48-64) 119/61 (01/18 0600) SpO2:  [91 %-99 %] 91 % (01/18 0600) Weight:  [110.2 kg-111.1 kg] 110.2 kg (01/18 0500)  Hemodynamic parameters for last 24 hours:    Intake/Output from previous day: 01/17 0701 - 01/18 0700 In: 78.3 [P.O.:30; IV Piggyback:48.3] Out: 1020 [Urine:950; Chest Tube:70] Intake/Output this shift: Total I/O In: 30 [P.O.:30] Out: 650 [Urine:650]  General appearance: alert and cooperative Neurologic: intact Heart: regular rate and rhythm and crisp mechanical valve click Lungs: clear to auscultation bilaterally Extremities: no edema Wound: dressing dry  Lab Results: Recent Labs    12/28/22 1621 12/29/22 0447  WBC 14.1* 12.6*  HGB 9.4* 8.9*  HCT 28.2* 26.4*  PLT 175 152   BMET:  Recent Labs    12/28/22 1621 12/29/22 0447  NA 136 138  K 4.2 4.2  CL 102 102  CO2 26 28  GLUCOSE 202* 161*  BUN 18 23*  CREATININE 0.92 1.07  CALCIUM 8.0* 8.6*    PT/INR:  Recent Labs    12/29/22 0447  LABPROT 15.2  INR 1.2   ABG    Component Value Date/Time   PHART 7.334 (L) 12/27/2022 2024   HCO3 22.5 12/27/2022 2024   TCO2 24 12/27/2022 2024   ACIDBASEDEF 3.0 (H) 12/27/2022 2024   O2SAT 99 12/27/2022 2024   CBG (last 3)  Recent Labs    12/29/22 1630 12/29/22 2036 12/30/22 0630  GLUCAP 140* 163* 164*    Assessment/Plan: S/P Procedure(s) (LRB): BENTALL PROCEDURE USING 27/29MM ON-X ASCENDING AORTIC  PROSTHESIS AND 28x10MM HEMASHIELD PLATINUM GRAFT (N/A) THORACIC ASCENDING ANEURYSM REPAIR (AAA) (N/A) TRANSESOPHAGEAL ECHOCARDIOGRAM (TEE) (N/A)  POD 3 Hemodynamically stable in sinus rhythm. Continue Lopressor.  Wt is below preop.  DM under adequate control. Continue Levemir and SSI. Home on previous regimen.  INR pending today. Check and order Coumadin.   Home when INR around 2.  Transfer to 4E and continue mobilization.   LOS: 3 days    Gaye Pollack 12/30/2022

## 2022-12-30 NOTE — Progress Notes (Signed)
[address]       [place] [address]             [phone]      POD # 3 [place]  Resting comfortably  BP 111/62   Pulse 71   Temp 98.7 F (37.1 C) (Oral)   Resp 17   Ht 5\' 7"  (1.702 m)   Wt 110.2 kg   SpO2 92%   BMI 38.05 kg/m   Intake/Output Summary (Last 24 hours) at 12/30/2022 1711 Last data filed at 12/30/2022 0900 Gross per 24 hour  Intake 290 ml  Output 650 ml  Net -360 ml   CBG moderately elevated  Awaiting bed on 4E  Eldora Napp C. Roxan Hockey, MD [facility] [phone]

## 2022-12-31 ENCOUNTER — Other Ambulatory Visit: Payer: Self-pay | Admitting: Student

## 2022-12-31 DIAGNOSIS — Z952 Presence of prosthetic heart valve: Secondary | ICD-10-CM

## 2022-12-31 LAB — BASIC METABOLIC PANEL
Anion gap: 9 (ref 5–15)
BUN: 15 mg/dL (ref 6–20)
CO2: 30 mmol/L (ref 22–32)
Calcium: 8.9 mg/dL (ref 8.9–10.3)
Chloride: 97 mmol/L — ABNORMAL LOW (ref 98–111)
Creatinine, Ser: 0.96 mg/dL (ref 0.61–1.24)
GFR, Estimated: >60 mL/min (ref >60)
Glucose, Bld: 192 mg/dL — ABNORMAL HIGH (ref 70–99)
Potassium: 3.7 mmol/L (ref 3.5–5.1)
Sodium: 136 mmol/L (ref 135–145)

## 2022-12-31 LAB — PROTIME-INR
INR: 2.8 — ABNORMAL HIGH (ref 0.8–1.2)
Prothrombin Time: 29 s — ABNORMAL HIGH (ref 11.4–15.2)

## 2022-12-31 LAB — CBC
HCT: 27.3 % — ABNORMAL LOW (ref 39.0–52.0)
Hemoglobin: 9.1 g/dL — ABNORMAL LOW (ref 13.0–17.0)
MCH: 30.7 pg (ref 26.0–34.0)
MCHC: 33.3 g/dL (ref 30.0–36.0)
MCV: 92.2 fL (ref 80.0–100.0)
Platelets: 202 10*3/uL (ref 150–400)
RBC: 2.96 MIL/uL — ABNORMAL LOW (ref 4.22–5.81)
RDW: 12.2 % (ref 11.5–15.5)
WBC: 7.8 10*3/uL (ref 4.0–10.5)
nRBC: 0 % (ref 0.0–0.2)

## 2022-12-31 LAB — GLUCOSE, CAPILLARY
Glucose-Capillary: 159 mg/dL — ABNORMAL HIGH (ref 70–99)
Glucose-Capillary: 186 mg/dL — ABNORMAL HIGH (ref 70–99)
Glucose-Capillary: 183 mg/dL — ABNORMAL HIGH (ref 70–99)
Glucose-Capillary: 172 mg/dL — ABNORMAL HIGH (ref 70–99)
Glucose-Capillary: 174 mg/dL — ABNORMAL HIGH (ref 70–99)

## 2022-12-31 MED ORDER — LACTULOSE 10 GM/15ML PO SOLN
20.0000 g | Freq: Once | ORAL | Status: AC
  Administered 2022-12-31: 20 g via ORAL
  Filled 2022-12-31: qty 30

## 2022-12-31 MED ORDER — INSULIN DETEMIR 100 UNIT/ML ~~LOC~~ SOLN
35.0000 [IU] | Freq: Every day | SUBCUTANEOUS | Status: DC
  Administered 2022-12-31 – 2023-01-01 (×2): 35 [IU] via SUBCUTANEOUS
  Filled 2022-12-31 (×2): qty 0.35

## 2022-12-31 NOTE — Progress Notes (Signed)
CARDIAC REHAB PHASE I   Pt sitting in chair, feeling well today. Pt has ambulated 3-4 times today. Reports tolerating well with minimal pain and no SOB. Post OHS education including site care, home needs at discharge, sternal precautions, exercise guidelines, heart healthy diabetic diet, IS use at home, restrictions, risk factors and CRP2 reviewed. All questions and concerns addressed. Will refer to [facility] for CRP2. Will continue to follow.  [id number]  Vanessa Barbara, RN BSN 12/31/2022 2:16 PM

## 2022-12-31 NOTE — Progress Notes (Signed)
Ordered repeat outpatient Echo following aortic valve replacement per protocol. Primary Cardiologist: Dr. Phineas Inches CT Surgeon: Dr. Everardo Beals, PA-C 12/31/2022 3:33 PM

## 2022-12-31 NOTE — Progress Notes (Signed)
Patient arrived at the unit,CHG bath given,CCMD notified,vitals checked patient oriented to the unit,will continue to monitor

## 2022-12-31 NOTE — Progress Notes (Signed)
4 Days Post-Op Procedure(s) (LRB): BENTALL PROCEDURE USING 27/29MM ON-X ASCENDING AORTIC PROSTHESIS AND 28x10MM HEMASHIELD PLATINUM GRAFT (N/A) THORACIC ASCENDING ANEURYSM REPAIR (AAA) (N/A) TRANSESOPHAGEAL ECHOCARDIOGRAM (TEE) (N/A) Subjective: No complaints. Ambulated well yesterday. No BM since surgery.  Objective: Vital signs in last 24 hours: Temp:  [97.7 F (36.5 C)-99 F (37.2 C)] 97.7 F (36.5 C) (01/18 2348) Pulse Rate:  [65-78] 70 (01/19 0400) Cardiac Rhythm: Normal sinus rhythm (01/19 0400) Resp:  [14-25] 17 (01/19 0400) BP: (101-126)/(55-68) 113/62 (01/19 0400) SpO2:  [91 %-98 %] 94 % (01/19 0400) FiO2 (%):  [21 %] 21 % (01/18 1915) Weight:  [109.5 kg] 109.5 kg (01/19 0500)  Hemodynamic parameters for last 24 hours:    Intake/Output from previous day: 01/18 0701 - 01/19 0700 In: 740 [P.O.:740] Out: -  Intake/Output this shift: No intake/output data recorded.  General appearance: alert and cooperative Neurologic: intact Heart: regular rate and rhythm and crisp mechanical valve click Lungs: clear to auscultation bilaterally Extremities: extremities normal, atraumatic, no cyanosis or edema Wound: incision healing well.  Lab Results: Recent Labs    12/29/22 0447 12/31/22 0603  WBC 12.6* 7.8  HGB 8.9* 9.1*  HCT 26.4* 27.3*  PLT 152 202   BMET:  Recent Labs    12/29/22 0447 12/31/22 0603  NA 138 136  K 4.2 3.7  CL 102 97*  CO2 28 30  GLUCOSE 161* 192*  BUN 23* 15  CREATININE 1.07 0.96  CALCIUM 8.6* 8.9    PT/INR:  Recent Labs    12/31/22 0603  LABPROT 29.0*  INR 2.8*   ABG    Component Value Date/Time   PHART 7.334 (L) 12/27/2022 2024   HCO3 22.5 12/27/2022 2024   TCO2 24 12/27/2022 2024   ACIDBASEDEF 3.0 (H) 12/27/2022 2024   O2SAT 99 12/27/2022 2024   CBG (last 3)  Recent Labs    12/30/22 1553 12/30/22 2125 12/31/22 0637  GLUCAP 181* 208* 159*    Assessment/Plan: S/P Procedure(s) (LRB): BENTALL PROCEDURE USING 27/29MM  ON-X ASCENDING AORTIC PROSTHESIS AND 28x10MM HEMASHIELD PLATINUM GRAFT (N/A) THORACIC ASCENDING ANEURYSM REPAIR (AAA) (N/A) TRANSESOPHAGEAL ECHOCARDIOGRAM (TEE) (N/A)  POD 4 Hemodynamically stable in sinus rhythm.  Glucose a little elevated. Will increase Levemir.  INR jumped to 2.8 from 1.6. will hold off on Coumadin today and check in am. DC ASA.  Waiting on 4E bed.   May be able to go home tomorrow depending on INR and ability to reasonably predict Coumadin dose.   LOS: 4 days    Gaye Pollack 12/31/2022

## 2023-01-01 LAB — GLUCOSE, CAPILLARY: Glucose-Capillary: 172 mg/dL — ABNORMAL HIGH (ref 70–99)

## 2023-01-01 LAB — PROTIME-INR
INR: 2.3 — ABNORMAL HIGH (ref 0.8–1.2)
Prothrombin Time: 25.4 seconds — ABNORMAL HIGH (ref 11.4–15.2)

## 2023-01-01 MED ORDER — WARFARIN SODIUM 2.5 MG PO TABS: 2.5000 mg | ORAL_TABLET | Freq: Every day | ORAL | 11 refills | Status: DC

## 2023-01-01 MED ORDER — METOPROLOL TARTRATE 25 MG PO TABS: 12.5000 mg | ORAL_TABLET | Freq: Two times a day (BID) | ORAL | 3 refills | Status: AC

## 2023-01-01 MED ORDER — ACETAMINOPHEN 500 MG PO TABS: 1000.0000 mg | ORAL_TABLET | Freq: Four times a day (QID) | ORAL | 0 refills | Status: DC | PRN

## 2023-01-01 MED ORDER — OXYCODONE HCL 5 MG PO TABS: 5.0000 mg | ORAL_TABLET | ORAL | 0 refills | Status: DC | PRN

## 2023-01-01 NOTE — Progress Notes (Signed)
[address]       [place] [address]             [phone]    5 Days Post-Op Procedure(s) (LRB): BENTALL PROCEDURE USING 27/29MM ON-X ASCENDING AORTIC PROSTHESIS AND 28x10MM HEMASHIELD PLATINUM GRAFT (N/A) THORACIC ASCENDING ANEURYSM REPAIR (AAA) (N/A) TRANSESOPHAGEAL ECHOCARDIOGRAM (TEE) (N/A)  Subjective:  Patient sitting up in chair.  He states he is doing pretty good.  + ambulation  Objective: Vital signs in last 24 hours: Temp:  [98.2 F (36.8 C)-98.6 F (37 C)] 98.4 F (36.9 C) (01/20 0426) Pulse Rate:  [69-78] 70 (01/20 0426) Cardiac Rhythm: Normal sinus rhythm (01/19 2004) Resp:  [16-20] 20 (01/20 0426) BP: (121-154)/(63-74) 154/74 (01/20 0426) SpO2:  [94 %-98 %] 94 % (01/20 0426) Weight:  [109 kg] 109 kg (01/20 0426)  Intake/Output from previous day: 01/19 0701 - 01/20 0700 In: 289 [P.O.:240; IV Piggyback:49] Out: -   General appearance: alert, cooperative, and no distress Heart: regular rate and rhythm Lungs: clear to auscultation bilaterally Abdomen: soft, non-tender; bowel sounds normal; no masses,  no organomegaly Extremities: edema trace Wound: clean and dry  Lab Results: Recent Labs    12/31/22 0603  WBC 7.8  HGB 9.1*  HCT 27.3*  PLT 202   BMET:  Recent Labs    12/31/22 0603  NA 136  K 3.7  CL 97*  CO2 30  GLUCOSE 192*  BUN 15  CREATININE 0.96  CALCIUM 8.9    PT/INR:  Recent Labs    01/01/23 0114  LABPROT 25.4*  INR 2.3*   ABG    Component Value Date/Time   PHART 7.334 (L) 12/27/2022 2024   HCO3 22.5 12/27/2022 2024   TCO2 24 12/27/2022 2024   ACIDBASEDEF 3.0 (H) 12/27/2022 2024   O2SAT 99 12/27/2022 2024   CBG (last 3)  Recent Labs    12/31/22 1746 12/31/22 2108 01/01/23 0601  GLUCAP 172* 174* 172*    Assessment/Plan: S/P Procedure(s) (LRB): BENTALL PROCEDURE USING 27/29MM ON-X ASCENDING AORTIC PROSTHESIS AND 28x10MM HEMASHIELD PLATINUM GRAFT (N/A) THORACIC ASCENDING ANEURYSM REPAIR  (AAA) (N/A) TRANSESOPHAGEAL ECHOCARDIOGRAM (TEE) (N/A)  CV- NSR, mild HTN- continue Lopressor INR 2.3, as discussed with Dr. Cyndia Bent will d/c on 2.5 mg daily Renal-weight is below baseline, mild edema on exam DM- sugars controlled Dispo- patient stable, will d/c home today   LOS: 5 days   Ellwood Handler, PA-C 01/01/2023

## 2023-01-01 NOTE — Progress Notes (Signed)
CARDIAC REHAB PHASE I   PRE:  Rate/Rhythm: NSR 78  BP:  Sitting: 114/73      SaO2: 100%  MODE:  Ambulation: 470 ft/ SaO2 = 97%   POST:  Rate/Rhythm: NSR 84  BP:  Sitting: 134/79      SaO2: 98%  Pt received in chair. Pt agrees to ambulate. Encouraged I/S 10 x/hr. I/S = 1500 mL. Pt ambulates with steady gait. No c/o SOB, dizziness, or pain with ambulation. Pt back to chair with tray table and call bell in reach, family in room. Pt voices he has no questions in regards to his discharge cardiac rehab education. Pt encouraged to walk later with family.   Lesly Rubenstein, MS, ACSM EP-C, [facility] 01/01/2023 9:03 - 9:29

## 2023-01-04 ENCOUNTER — Encounter (HOSPITAL_COMMUNITY): Payer: Self-pay

## 2023-01-04 ENCOUNTER — Other Ambulatory Visit: Payer: Self-pay | Admitting: *Deleted

## 2023-01-04 ENCOUNTER — Ambulatory Visit (HOSPITAL_COMMUNITY)
Admission: RE | Admit: 2023-01-04 | Discharge: 2023-01-04 | Disposition: A | Discharge status: Home / Self Care | Payer: BC Managed Care – PPO | Source: Ambulatory Visit | Attending: Surgery | Admitting: Surgery

## 2023-01-04 ENCOUNTER — Inpatient Hospital Stay (HOSPITAL_COMMUNITY): Payer: BC Managed Care – PPO

## 2023-01-04 ENCOUNTER — Ambulatory Visit: Payer: Self-pay | Admitting: *Deleted

## 2023-01-04 ENCOUNTER — Inpatient Hospital Stay (HOSPITAL_COMMUNITY)
Admission: AD | Admit: 2023-01-04 | Discharge: 2023-01-09 | DRG: 271 | Disposition: A | Discharge status: Home / Self Care | Payer: BC Managed Care – PPO | Attending: Surgery | Admitting: Surgery

## 2023-01-04 VITALS — BP 97/68 | HR 97 | Resp 18 | Ht 67.0 in

## 2023-01-04 DIAGNOSIS — E7800 Pure hypercholesterolemia, unspecified: Secondary | ICD-10-CM | POA: Diagnosis present

## 2023-01-04 DIAGNOSIS — Z7984 Long term (current) use of oral hypoglycemic drugs: Secondary | ICD-10-CM

## 2023-01-04 DIAGNOSIS — E1143 Type 2 diabetes mellitus with diabetic autonomic (poly)neuropathy: Secondary | ICD-10-CM | POA: Diagnosis present

## 2023-01-04 DIAGNOSIS — I9789 Other postprocedural complications and disorders of the circulatory system, not elsewhere classified: Principal | ICD-10-CM | POA: Diagnosis present

## 2023-01-04 DIAGNOSIS — I119 Hypertensive heart disease without heart failure: Secondary | ICD-10-CM | POA: Insufficient documentation

## 2023-01-04 DIAGNOSIS — Y713 Surgical instruments, materials and cardiovascular devices (including sutures) associated with adverse incidents: Secondary | ICD-10-CM | POA: Diagnosis present

## 2023-01-04 DIAGNOSIS — Z794 Long term (current) use of insulin: Secondary | ICD-10-CM

## 2023-01-04 DIAGNOSIS — Z79899 Other long term (current) drug therapy: Secondary | ICD-10-CM

## 2023-01-04 DIAGNOSIS — K3184 Gastroparesis: Secondary | ICD-10-CM | POA: Diagnosis present

## 2023-01-04 DIAGNOSIS — Z6837 Body mass index (BMI) 37.0-37.9, adult: Secondary | ICD-10-CM

## 2023-01-04 DIAGNOSIS — R112 Nausea with vomiting, unspecified: Secondary | ICD-10-CM | POA: Diagnosis present

## 2023-01-04 DIAGNOSIS — I3139 Other pericardial effusion (noninflammatory): Secondary | ICD-10-CM

## 2023-01-04 DIAGNOSIS — Z4802 Encounter for removal of sutures: Secondary | ICD-10-CM

## 2023-01-04 DIAGNOSIS — Z952 Presence of prosthetic heart valve: Secondary | ICD-10-CM

## 2023-01-04 DIAGNOSIS — Z7901 Long term (current) use of anticoagulants: Secondary | ICD-10-CM

## 2023-01-04 DIAGNOSIS — E119 Type 2 diabetes mellitus without complications: Secondary | ICD-10-CM | POA: Insufficient documentation

## 2023-01-04 DIAGNOSIS — Z8249 Family history of ischemic heart disease and other diseases of the circulatory system: Secondary | ICD-10-CM

## 2023-01-04 DIAGNOSIS — Y838 Other surgical procedures as the cause of abnormal reaction of the patient, or of later complication, without mention of misadventure at the time of the procedure: Secondary | ICD-10-CM | POA: Diagnosis present

## 2023-01-04 DIAGNOSIS — E785 Hyperlipidemia, unspecified: Secondary | ICD-10-CM | POA: Insufficient documentation

## 2023-01-04 DIAGNOSIS — I10 Essential (primary) hypertension: Secondary | ICD-10-CM | POA: Diagnosis present

## 2023-01-04 DIAGNOSIS — Z800 Family history of malignant neoplasm of digestive organs: Secondary | ICD-10-CM

## 2023-01-04 DIAGNOSIS — G4730 Sleep apnea, unspecified: Secondary | ICD-10-CM | POA: Insufficient documentation

## 2023-01-04 DIAGNOSIS — I2510 Atherosclerotic heart disease of native coronary artery without angina pectoris: Secondary | ICD-10-CM | POA: Insufficient documentation

## 2023-01-04 LAB — ECHOCARDIOGRAM COMPLETE
AR max vel: 1.98 cm2
AV Area VTI: 1.82 cm2
AV Area mean vel: 1.87 cm2
AV Mean grad: 8 mmHg
AV Peak grad: 16.2 mmHg
Ao pk vel: 2.01 m/s
Area-P 1/2: 3.72 cm2
Height: 67 in
S' Lateral: 2.4 cm

## 2023-01-04 LAB — CBC
HCT: 26.8 % — ABNORMAL LOW (ref 39.0–52.0)
Hemoglobin: 8.8 g/dL — ABNORMAL LOW (ref 13.0–17.0)
MCH: 30.3 pg (ref 26.0–34.0)
MCHC: 32.8 g/dL (ref 30.0–36.0)
MCV: 92.4 fL (ref 80.0–100.0)
Platelets: 366 10*3/uL (ref 150–400)
RBC: 2.9 MIL/uL — ABNORMAL LOW (ref 4.22–5.81)
RDW: 12.5 % (ref 11.5–15.5)
WBC: 18.1 10*3/uL — ABNORMAL HIGH (ref 4.0–10.5)
nRBC: 0 % (ref 0.0–0.2)

## 2023-01-04 LAB — HEMOGLOBIN A1C
Hgb A1c MFr Bld: 5.8 % — ABNORMAL HIGH (ref 4.8–5.6)
Mean Plasma Glucose: 119.76 mg/dL

## 2023-01-04 LAB — PREPARE RBC (CROSSMATCH)

## 2023-01-04 LAB — BASIC METABOLIC PANEL
Anion gap: 13 (ref 5–15)
BUN: 18 mg/dL (ref 6–20)
CO2: 25 mmol/L (ref 22–32)
Calcium: 8.7 mg/dL — ABNORMAL LOW (ref 8.9–10.3)
Chloride: 92 mmol/L — ABNORMAL LOW (ref 98–111)
Creatinine, Ser: 1.17 mg/dL (ref 0.61–1.24)
GFR, Estimated: >60 mL/min (ref >60)
Glucose, Bld: 300 mg/dL — ABNORMAL HIGH (ref 70–99)
Potassium: 4.1 mmol/L (ref 3.5–5.1)
Sodium: 130 mmol/L — ABNORMAL LOW (ref 135–145)

## 2023-01-04 LAB — MRSA NEXT GEN BY PCR, NASAL: MRSA by PCR Next Gen: NOT DETECTED

## 2023-01-04 LAB — PROTIME-INR
INR: 3.2 — ABNORMAL HIGH (ref 0.8–1.2)
Prothrombin Time: 32.2 seconds — ABNORMAL HIGH (ref 11.4–15.2)

## 2023-01-04 LAB — GLUCOSE, CAPILLARY: Glucose-Capillary: 287 mg/dL — ABNORMAL HIGH (ref 70–99)

## 2023-01-04 MED ORDER — INSULIN ASPART 100 UNIT/ML IJ SOLN
0.0000 [IU] | INTRAMUSCULAR | Status: DC
  Administered 2023-01-04: 8 [IU] via SUBCUTANEOUS
  Administered 2023-01-05 (×2): 5 [IU] via SUBCUTANEOUS

## 2023-01-04 MED ORDER — SODIUM CHLORIDE 0.9% IV SOLUTION: Freq: Once | INTRAVENOUS | Status: AC

## 2023-01-04 MED ORDER — PROMETHAZINE HCL 12.5 MG PO TABS: 12.5000 mg | ORAL_TABLET | Freq: Four times a day (QID) | ORAL | 0 refills | Status: DC | PRN

## 2023-01-04 MED ORDER — DEXTROSE-NACL 5-0.9 % IV SOLN: INTRAVENOUS | Status: DC

## 2023-01-04 MED ORDER — SODIUM CHLORIDE 0.9 % IV SOLN
12.5000 mg | Freq: Four times a day (QID) | INTRAVENOUS | Status: DC | PRN
  Administered 2023-01-04 – 2023-01-05 (×2): 12.5 mg via INTRAVENOUS
  Filled 2023-01-04 (×3): qty 0.5

## 2023-01-04 MED ORDER — FENTANYL CITRATE PF 50 MCG/ML IJ SOSY: 12.5000 ug | PREFILLED_SYRINGE | INTRAMUSCULAR | Status: DC | PRN

## 2023-01-04 NOTE — H&P (Signed)
[address]       [place] [id number]             [phone]      Cardiothoracic Surgery Admission History and Physical  Roberto ScalesJonathan R Creech is an 47 y.o. male.   Chief Complaint: Nausea and vomiting HPI:   The patient is a 47 year old gentleman who underwent replacement of ascending aorta under deep hypothermic circulatory arrest using a 28 x 10 mm Hemashield Platinum graft and Bentall procedure using a 27/29 mm On-X mechanical valved graft on 12/27/2022. He had marked nausea postop that resolved after a day and was likely related to anesthesia and diabetic gastroparesis. He had an otherwise unremarkable postop course and was discharged on 01/01/23 on Coumadin 2.5 mg daily. His last INR was 2.3 on the day of discharge and he has not had it checked this week yet. According to his wife he developed nausea and vomiting about 3 am today and he was seen in our office today for chest tube suture removal. He was very nauseous all day. In the office BP was 97/68 which is low for him since he is usually hypertensive. His BP on the morning of discharge was 154/74. He has also developed left shoulder pain. I was concerned about the possibility of a pericardial effusion and had him return for an echo which I discussed with Dr. Royann Shiversroitoru and shows a moderate sized pericardial effusion that is circumferential and may have a blood component. There may be early signs of tamponade. Prosthetic aortic valve is functioning normally and LVEF normal. RV function normal. CXR shows large cardiac shadow and Left base atelectasis, possible pleural effusion.     Past Medical History:  Diagnosis Date   Diabetes mellitus without complication (HCC)    type 2   High cholesterol    Hypertension    Kidney stones    Migraine    MIGRAINE HEADACHE 04/05/2007   Qualifier: Diagnosis of  By: Drue NovelPaz MD, Nolon RodJose E.    Non-restorative sleep 03/12/2019   Sleep apnea    wears CPAP    Past Surgical History:   Procedure Laterality Date   BACK SURGERY     ruptured L4 and L5- x2   BENTALL PROCEDURE N/A 12/27/2022   Procedure: BENTALL PROCEDURE USING 27/29MM ON-X ASCENDING AORTIC PROSTHESIS AND 28x10MM HEMASHIELD PLATINUM GRAFT;  Surgeon: Alleen BorneBartle, Raziel Koenigs K, MD;  Location: [facility] OR;  Service: Open Heart Surgery;  Laterality: N/A;  [facility] ARREST   MOUTH SURGERY     RIGHT/LEFT HEART CATH AND CORONARY ANGIOGRAPHY N/A 12/17/2022   Procedure: RIGHT/LEFT HEART CATH AND CORONARY ANGIOGRAPHY;  Surgeon: Orbie Pyohukkani, Arun K, MD;  Location: [facility] INVASIVE CV LAB;  Service: Cardiovascular;  Laterality: N/A;   TEE WITHOUT CARDIOVERSION N/A 12/27/2022   Procedure: TRANSESOPHAGEAL ECHOCARDIOGRAM (TEE);  Surgeon: Alleen BorneBartle, Delanna Blacketer K, MD;  [facility];  Service: Open Heart Surgery;  Laterality: N/A;   THORACIC AORTIC ANEURYSM REPAIR N/A 12/27/2022   Procedure: THORACIC ASCENDING ANEURYSM REPAIR (AAA);  Surgeon: Alleen BorneBartle, Dorlis Judice K, MD;  [facility];  Service: Open Heart Surgery;  Laterality: N/A;    Family History  Problem Relation Age of Onset   Osteoarthritis Mother    Hypertension Father    Liver cancer Maternal Grandmother    Hypertension Paternal Grandfather    Heart attack Paternal Grandfather    Colon cancer Neg Hx    Prostate cancer Neg Hx    Social History:  reports that he  has never smoked. He has never used smokeless tobacco. He reports current alcohol use. He reports that he does not use drugs.  Allergies: No Known Allergies  Medications Prior to Admission  Medication Sig Dispense Refill   promethazine (PHENERGAN) 12.5 MG tablet Take 1 tablet (12.5 mg total) by mouth every 6 (six) hours as needed for nausea or vomiting. 15 tablet 0   acetaminophen (TYLENOL) 500 MG tablet Take 2 tablets (1,000 mg total) by mouth every 6 (six) hours as needed for mild pain or fever. 30 tablet 0   Alprostadil (PROSTAGLANDIN E1) POWD daily as needed (ED).     atorvastatin (LIPITOR) 40 MG tablet Take 1 tablet (40 mg total) by mouth  daily. (Patient taking differently: Take 40 mg by mouth at bedtime.) 90 tablet 3   blood glucose meter kit and supplies Dispense based on patient and insurance preference. Use one time daily. ICD10: E11.65 1 each 0   fenofibrate 160 MG tablet Take 160 mg by mouth in the morning.     glipiZIDE (GLUCOTROL) 5 MG tablet Take 5 mg by mouth at bedtime.     glucose blood (ONETOUCH VERIO) test strip 1 each by Other route in the morning and at bedtime. Use as instructed 100 each 1   GVOKE HYPOPEN 1-PACK 1 MG/0.2ML SOAJ Inject 1 mg into the skin daily as needed (hypoglycemia).     [START ON 01/27/2023] lamoTRIgine (LAMICTAL) 100 MG tablet Take 1 tablet (100 mg total) by mouth 2 (two) times daily. 60 tablet 5   lamoTRIgine (LAMICTAL) 25 MG tablet Take 1 pill at bedtime for 2 weeks. Then take 1 pill twice a day for 2 weeks. Then take 1 pill in AM and 2 pills in PM for one week. Then take 2 pills twice a day for one week. Then take 2 pills in AM and 3 pills in PM for one week. Then take 3 pills twice a day for one week. Then take 3 pills in AM and 4 pills in PM for one week. Then take 4 pills twice a day until pills are gone. Then start 100 mg tablets. 273 tablet 0   metoprolol tartrate (LOPRESSOR) 25 MG tablet Take 0.5 tablets (12.5 mg total) by mouth 2 (two) times daily. 30 tablet 3   NOVOLIN 70/30 KWIKPEN (70-30) 100 UNIT/ML KwikPen Inject 18 Units into the skin in the morning and at bedtime.     oxyCODONE (OXY IR/ROXICODONE) 5 MG immediate release tablet Take 1 tablet (5 mg total) by mouth every 4 (four) hours as needed for severe pain. 30 tablet 0   pioglitazone (ACTOS) 15 MG tablet Take 15 mg by mouth in the morning.     VITAMIN D PO Take 2,000 Units by mouth in the morning.     warfarin (COUMADIN) 2.5 MG tablet Take 1 tablet (2.5 mg total) by mouth daily. 30 tablet 11   ZENPEP 40000-126000 units CPEP Take 2 capsules by mouth with breakfast, with lunch, and with evening meal.      No results found for this  or any previous visit (from the past 48 hour(s)). ECHOCARDIOGRAM COMPLETE  Result Date: 01/04/2023    ECHOCARDIOGRAM REPORT   Patient Name:   DAKAI BRAITHWAITE Date of Exam: 01/04/2023 Medical Rec #:  [id number]          Height:       67.0 in Accession #:    [id number]         Weight:  240.2 lb Date of Birth:  09/04/1976           BSA:          2.186 m Patient Age:    46 years           BP:           120/74 mmHg Patient Gender: M                  HR:           89 bpm. Exam Location:  Outpatient Procedure: Limited Echo, Limited Color Doppler and Cardiac Doppler Indications:    pericardial effusion  History:        Patient has prior history of Echocardiogram examinations, most                 recent 10/04/2022. CAD, status post Bentall surgery; Risk                 Factors:Hypertension, Dyslipidemia, Sleep Apnea and Diabetes.                 Aortic Valve: 27-29 On-X Bentall mechanical valve is present in                 the aortic position. Procedure Date: 12/27/2022.  Sonographer:    Delcie RochLauren Pennington RDCS Referring Phys: [id number] Payton DoughtyBRYAN K Jerian Morais  Sonographer Comments: Image acquisition challenging due to patient body habitus and patient vomitting. IMPRESSIONS  1. Left ventricular ejection fraction, by estimation, is 65 to 70%. The left ventricle has normal function. The left ventricle has no regional wall motion abnormalities. There is mild concentric left ventricular hypertrophy. Left ventricular diastolic parameters are consistent with Grade II diastolic dysfunction (pseudonormalization). Elevated left atrial pressure.  2. Right ventricular systolic function is normal. The right ventricular size is normal. Tricuspid regurgitation signal is inadequate for assessing PA pressure.  3. Around the RV free wall and apex the pericardial fluid appears to be denser, maybe with echogenicity of clot. The inferior vena cava appears plethoric, but there is no diastolic chamber collapse. There is only mildly exaggerated  respiratory flow variation across the AV valves (mitral flow variation approximately 20%), but in some images there is subtle respiratory displacement of the ventricular septum. Finally there is a suggestion of pulsus paradoxus when evaluating the flow velocity across the pulmonary and aortic valves. Moderate pericardial effusion. The pericardial effusion is circumferential.  4. The mitral valve is normal in structure. No evidence of mitral valve regurgitation.  5. The aortic valve has been repaired/replaced. Aortic valve regurgitation is not visualized. There is a 27-29 On-X Bentall mechanical valve present in the aortic position. Procedure Date: 12/27/2022. Echo findings are consistent with normal structure and function of the aortic valve prosthesis.  6. Graft repair of the ascending aorta appears intact. Aortic root/ascending aorta has been repaired/replaced.  7. The inferior vena cava is dilated in size with <50% respiratory variability, suggesting right atrial pressure of 15 mmHg. Comparison(s): Compared to the postoperative images the pericardial effusion is new. There are subtle signs of ventricular interdependence, worrisome for early tamponade physiology. FINDINGS  Left Ventricle: Left ventricular ejection fraction, by estimation, is 65 to 70%. The left ventricle has normal function. The left ventricle has no regional wall motion abnormalities. The left ventricular internal cavity size was normal in size. There is  mild concentric left ventricular hypertrophy. Abnormal (paradoxical) septal motion consistent with post-operative status. Left ventricular diastolic parameters are consistent with Grade II diastolic  dysfunction (pseudonormalization). Elevated left atrial pressure. Right Ventricle: The right ventricular size is normal. No increase in right ventricular wall thickness. Right ventricular systolic function is normal. Tricuspid regurgitation signal is inadequate for assessing PA pressure. Left Atrium:  Left atrial size was normal in size. Right Atrium: Right atrial size was not well visualized. Pericardium: Around the RV free wall and apex the pericardial fluid appears to be denser, maybe with echogenicity of clot. The inferior vena cava appears plethoric, but there is no diastolic chamber collapse. There is only mildly exaggerated respiratory flow variation across the AV valves (mitral flow variation approximately 20%), but in some images there is subtle respiratory displacement of the ventricular septum. Finally there is a suggestion of pulsus paradoxus when evaluating the flow velocity across the pulmonary and aortic valves. A moderately sized pericardial effusion is present. The pericardial effusion is circumferential. The pericardial effusion appears to contain mixed echogenic material. Mitral Valve: The mitral valve is normal in structure. No evidence of mitral valve regurgitation. Tricuspid Valve: The tricuspid valve is grossly normal. Tricuspid valve regurgitation is not demonstrated. Aortic Valve: The aortic valve has been repaired/replaced. Aortic valve regurgitation is not visualized. Aortic valve mean gradient measures 8.0 mmHg. Aortic valve peak gradient measures 16.2 mmHg. Aortic valve area, by VTI measures 1.82 cm. There is a 27-29 On-X Bentall mechanical valve present in the aortic position. Procedure Date: 12/27/2022. Echo findings are consistent with normal structure and function of the aortic valve prosthesis. Pulmonic Valve: The pulmonic valve was not well visualized. Pulmonic valve regurgitation is not visualized. Aorta: Graft repair of the ascending aorta appears intact. The aortic root/ascending aorta has been repaired/replaced. Venous: The inferior vena cava is dilated in size with less than 50% respiratory variability, suggesting right atrial pressure of 15 mmHg. IAS/Shunts: The interatrial septum was not well visualized.  LEFT VENTRICLE PLAX 2D LVIDd:         3.80 cm LVIDs:         2.40  cm LV PW:         1.50 cm LV IVS:        1.40 cm LVOT diam:     1.90 cm LV SV:         56 LV SV Index:   26 LVOT Area:     2.84 cm  IVC IVC diam: 2.30 cm LEFT ATRIUM         Index LA diam:    3.50 cm 1.60 cm/m  AORTIC VALVE AV Area (Vmax):    1.98 cm AV Area (Vmean):   1.87 cm AV Area (VTI):     1.82 cm AV Vmax:           201.00 cm/s AV Vmean:          132.000 cm/s AV VTI:            0.308 m AV Peak Grad:      16.2 mmHg AV Mean Grad:      8.0 mmHg LVOT Vmax:         140.50 cm/s LVOT Vmean:        86.850 cm/s LVOT VTI:          0.198 m LVOT/AV VTI ratio: 0.64  AORTA Ao Root diam: 3.20 cm MITRAL VALVE MV Area (PHT): 3.72 cm    SHUNTS MV Decel Time: 204 msec    Systemic VTI:  0.20 m MV E velocity: 94.40 cm/s  Systemic Diam: 1.90 cm MV A velocity: 73.90 cm/s MV  E/A ratio:  1.28 Mihai Croitoru MD Electronically signed by Thurmon FairMihai Croitoru MD Signature Date/Time: 01/04/2023/5:25:21 PM    Final     Review of Systems  Constitutional:  Positive for fatigue.  HENT: Negative.    Eyes: Negative.   Respiratory:  Negative for shortness of breath.   Cardiovascular:  Negative for chest pain and leg swelling.  Gastrointestinal:  Positive for nausea and vomiting. Negative for abdominal pain.  Endocrine: Negative.   Genitourinary: Negative.   Musculoskeletal:        Left shoulder pain.  Skin: Negative.   Allergic/Immunologic: Negative.   Neurological:  Negative for dizziness and syncope.  Hematological: Negative.   Psychiatric/Behavioral: Negative.      Blood pressure (!) 155/81, pulse 89, temperature 99.1 F (37.3 C), temperature source Oral, resp. rate (!) 28, weight 108.9 kg, SpO2 94 %. Physical Exam Constitutional:      Appearance: He is ill-appearing.  HENT:     Head: Normocephalic and atraumatic.  Eyes:     General: No scleral icterus.    Extraocular Movements: Extraocular movements intact.     Pupils: Pupils are equal, round, and reactive to light.  Neck:     Vascular: No carotid bruit.   Cardiovascular:     Rate and Rhythm: Normal rate and regular rhythm.     Pulses: Normal pulses.     Heart sounds: No murmur heard.    Comments: Crisp mechanical valve click. Pulmonary:     Effort: Pulmonary effort is normal.     Breath sounds: Normal breath sounds.  Abdominal:     General: Bowel sounds are normal. There is no distension.     Tenderness: There is no abdominal tenderness.  Musculoskeletal:        General: No swelling.  Skin:    General: Skin is warm and dry.     Comments: Chest incision healing well.  Neurological:     General: No focal deficit present.     Mental Status: He is oriented to person, place, and time.  Psychiatric:        Mood and Affect: Mood normal.        Behavior: Behavior normal.      ECHOCARDIOGRAM REPORT       Patient Name:   Roberto ScalesJONATHAN R Shives Date of Exam: 01/04/2023 Medical Rec #:  [id number]          Height:       67.0 in Accession #:    [id number]         Weight:       240.2 lb Date of Birth:  12/09/1976           BSA:          2.186 m Patient Age:    46 years           BP:           120/74 mmHg Patient Gender: M                  HR:           89 bpm. Exam Location:  Outpatient  Procedure: Limited Echo, Limited Color Doppler and Cardiac Doppler  Indications:    pericardial effusion   History:        Patient has prior history of Echocardiogram examinations, most                 recent 10/04/2022. CAD, status post Bentall surgery; Risk  Factors:Hypertension, Dyslipidemia, Sleep Apnea and Diabetes.                 Aortic Valve: 27-29 On-X Bentall mechanical valve is present in                 the aortic position. Procedure Date: 12/27/2022.   Sonographer:    Delcie Roch RDCS Referring Phys: [id number] Payton Doughty Chrishaun Sasso    Sonographer Comments: Image acquisition challenging due to patient body habitus and patient vomitting. IMPRESSIONS    1. Left ventricular ejection fraction, by estimation, is 65 to 70%.  The left ventricle has normal function. The left ventricle has no regional wall motion abnormalities. There is mild concentric left ventricular hypertrophy. Left ventricular diastolic parameters are consistent with Grade II diastolic dysfunction (pseudonormalization). Elevated left atrial pressure.  2. Right ventricular systolic function is normal. The right ventricular size is normal. Tricuspid regurgitation signal is inadequate for assessing PA pressure.  3. Around the RV free wall and apex the pericardial fluid appears to be denser, maybe with echogenicity of clot. The inferior vena cava appears plethoric, but there is no diastolic chamber collapse. There is only mildly exaggerated respiratory flow variation across the AV valves (mitral flow variation approximately 20%), but in some images there is subtle respiratory displacement of the ventricular septum. Finally there is a suggestion of pulsus paradoxus when evaluating the flow velocity across the pulmonary and aortic valves. Moderate pericardial effusion. The pericardial effusion is circumferential.  4. The mitral valve is normal in structure. No evidence of mitral valve regurgitation.  5. The aortic valve has been repaired/replaced. Aortic valve regurgitation is not visualized. There is a 27-29 On-X Bentall mechanical valve present in the aortic position. Procedure Date: 12/27/2022. Echo findings are consistent with normal structure and function of the aortic valve prosthesis.  6. Graft repair of the ascending aorta appears intact. Aortic root/ascending aorta has been repaired/replaced.  7. The inferior vena cava is dilated in size with <50% respiratory variability, suggesting right atrial pressure of 15 mmHg.  Comparison(s): Compared to the postoperative images the pericardial effusion is new. There are subtle signs of ventricular interdependence, worrisome for early tamponade physiology.  FINDINGS  Left Ventricle: Left  ventricular ejection fraction, by estimation, is 65 to 70%. The left ventricle has normal function. The left ventricle has no regional wall motion abnormalities. The left ventricular internal cavity size was normal in size. There is  mild concentric left ventricular hypertrophy. Abnormal (paradoxical) septal motion consistent with post-operative status. Left ventricular diastolic parameters are consistent with Grade II diastolic dysfunction (pseudonormalization). Elevated left atrial pressure.  Right Ventricle: The right ventricular size is normal. No increase in right ventricular wall thickness. Right ventricular systolic function is normal. Tricuspid regurgitation signal is inadequate for assessing PA pressure.  Left Atrium: Left atrial size was normal in size.  Right Atrium: Right atrial size was not well visualized.  Pericardium: Around the RV free wall and apex the pericardial fluid appears to be denser, maybe with echogenicity of clot. The inferior vena cava appears plethoric, but there is no diastolic chamber collapse. There is only mildly exaggerated respiratory flow variation across the AV valves (mitral flow variation approximately 20%), but in some images there is subtle respiratory displacement of the ventricular septum. Finally there is a suggestion of pulsus paradoxus when evaluating the flow velocity across the pulmonary and aortic valves. A moderately sized pericardial effusion is present. The pericardial effusion is circumferential. The pericardial effusion appears to contain mixed echogenic  material.  Mitral Valve: The mitral valve is normal in structure. No evidence of mitral valve regurgitation.  Tricuspid Valve: The tricuspid valve is grossly normal. Tricuspid valve regurgitation is not demonstrated.  Aortic Valve: The aortic valve has been repaired/replaced. Aortic valve regurgitation is not visualized. Aortic valve mean gradient measures 8.0 mmHg. Aortic  valve peak gradient measures 16.2 mmHg. Aortic valve area, by VTI measures 1.82 cm. There is a 27-29 On-X Bentall mechanical valve present in the aortic position. Procedure Date: 12/27/2022. Echo findings are consistent with normal structure and function of the aortic valve prosthesis.  Pulmonic Valve: The pulmonic valve was not well visualized. Pulmonic valve regurgitation is not visualized.  Aorta: Graft repair of the ascending aorta appears intact. The aortic root/ascending aorta has been repaired/replaced.  Venous: The inferior vena cava is dilated in size with less than 50% respiratory variability, suggesting right atrial pressure of 15 mmHg.  IAS/Shunts: The interatrial septum was not well visualized.    LEFT VENTRICLE PLAX 2D LVIDd:         3.80 cm LVIDs:         2.40 cm LV PW:         1.50 cm LV IVS:        1.40 cm LVOT diam:     1.90 cm LV SV:         56 LV SV Index:   26 LVOT Area:     2.84 cm    IVC IVC diam: 2.30 cm  LEFT ATRIUM         Index LA diam:    3.50 cm 1.60 cm/m  AORTIC VALVE AV Area (Vmax):    1.98 cm AV Area (Vmean):   1.87 cm AV Area (VTI):     1.82 cm AV Vmax:           201.00 cm/s AV Vmean:          132.000 cm/s AV VTI:            0.308 m AV Peak Grad:      16.2 mmHg AV Mean Grad:      8.0 mmHg LVOT Vmax:         140.50 cm/s LVOT Vmean:        86.850 cm/s LVOT VTI:          0.198 m LVOT/AV VTI ratio: 0.64   AORTA Ao Root diam: 3.20 cm  MITRAL VALVE MV Area (PHT): 3.72 cm    SHUNTS MV Decel Time: 204 msec    Systemic VTI:  0.20 m MV E velocity: 94.40 cm/s  Systemic Diam: 1.90 cm MV A velocity: 73.90 cm/s MV E/A ratio:  1.28  Mihai Croitoru MD Electronically signed by Sanda Klein MD Signature Date/Time: 01/04/2023/5:25:21 PM       Final      Assessment/Plan  He has developed a moderate pericardial effusion with early signs of tamponade and I suspect his nausea is related to that. He has no SOB but generally feels  weak. 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 This will require drainage in the OR. Will check his labs tonight and make sure INR not too high. As long as he remains stable I will plan to do this in the am. I discussed the operative procedure with the patient and his wife including alternatives, benefits and risks; including but not limited to bleeding, blood transfusion, infection.  Myrtha Mantis understands and agrees to proceed.    Gaspar Bidding  Alveria Apley, MD 01/04/2023, 5:39 PM

## 2023-01-04 NOTE — Progress Notes (Signed)
[facility] Progress Note:   Procedure(s) (LRB): SUBXYPHOID PERICARDIAL WINDOW (N/A) TRANSESOPHAGEAL ECHOCARDIOGRAM (N/A)  LOS: 0 days   Seen at bedside  + nausea.   Requesting nurse assistance to restroom, for stool.   Labs this PM - [id number] / hgb 8.8 / INR 3.2. Gluc 300 A1c 5.8.  Cr 1.1  Echo with 2cm effusion, no clear signs of active tamponade.  CXR: clear lung fields, large heart border.   Is on IVF  Exam:  Talking Abd soft, mild tenderness in RLQ, rest nontender.  WWP extremities   D/W Dr. Cyndia Bent - administer 2 FFP tonight.      Latest Ref Rng & Units 01/04/2023    5:42 PM 12/31/2022    6:03 AM 12/29/2022    4:47 AM  CBC  WBC 4.0 - 10.5 K/uL 18.1  7.8  12.6   Hemoglobin 13.0 - 17.0 g/dL 8.8  9.1  8.9   Hematocrit 39.0 - 52.0 % 26.8  27.3  26.4   Platelets 150 - 400 K/uL 366  202  152        Latest Ref Rng & Units 01/04/2023    5:42 PM 12/31/2022    6:03 AM 12/29/2022    4:47 AM  CMP  Glucose 70 - 99 mg/dL 300  192  161   BUN 6 - 20 mg/dL 18  15  23    Creatinine 0.61 - 1.24 mg/dL 1.17  0.96  1.07   Sodium 135 - 145 mmol/L 130  136  138   Potassium 3.5 - 5.1 mmol/L 4.1  3.7  4.2   Chloride 98 - 111 mmol/L 92  97  102   CO2 22 - 32 mmol/L 25  30  28    Calcium 8.9 - 10.3 mg/dL 8.7  8.9  8.6     ABG    Component Value Date/Time   PHART 7.334 (L) 12/27/2022 2024   PCO2ART 42.2 12/27/2022 2024   PO2ART 137 (H) 12/27/2022 2024   HCO3 22.5 12/27/2022 2024   TCO2 24 12/27/2022 2024   ACIDBASEDEF 3.0 (H) 12/27/2022 2024   O2SAT 99 12/27/2022 2024

## 2023-01-04 NOTE — Progress Notes (Signed)
Patient arrived for nurse visit to remove sutures post-[place] 1/15 by Dr. Cyndia Bent.  Two sutures removed with no signs or symptoms of infection noted.  Patient tolerated suture removal well.  Patient arrived to office very nauseous. States he experienced nausea throughout hospital stay but today is the worst it has been. Patient states last BM was day after discharge. Patient states he wants to eat but nausea prohibits him from eating. VS taken- 97/68, 98.2 temp, 97 HR, 95% O2. Patient states he is taking pain medication as prescribed. Patient's wife contacted office after leaving appt stating patient has started c/o bilateral shoulder pain and increased HR. Per Dr. Cyndia Bent, STAT ECHO ordered on patient. Advised wife of ECHO appt today at 4pm. Also advised of nausea medication being sent to patient's preferred pharmacy. Wife verbalizes understanding.

## 2023-01-05 ENCOUNTER — Inpatient Hospital Stay (HOSPITAL_COMMUNITY): Payer: BC Managed Care – PPO

## 2023-01-05 ENCOUNTER — Encounter (HOSPITAL_COMMUNITY)
Admission: AD | Disposition: A | Discharge status: Home / Self Care | Payer: Self-pay | Source: Home / Self Care | Attending: Surgery

## 2023-01-05 ENCOUNTER — Other Ambulatory Visit: Payer: Self-pay

## 2023-01-05 ENCOUNTER — Ambulatory Visit: Payer: BC Managed Care – PPO

## 2023-01-05 ENCOUNTER — Encounter (HOSPITAL_COMMUNITY): Payer: Self-pay | Admitting: Surgery

## 2023-01-05 DIAGNOSIS — I3139 Other pericardial effusion (noninflammatory): Secondary | ICD-10-CM | POA: Diagnosis not present

## 2023-01-05 HISTORY — PX: TEE WITHOUT CARDIOVERSION: SHX5443

## 2023-01-05 HISTORY — PX: SUBXYPHOID PERICARDIAL WINDOW: SHX5075

## 2023-01-05 LAB — BPAM FFP
Blood Product Expiration Date: 202401262359
Blood Product Expiration Date: 202401262359
ISSUE DATE / TIME: 202401232042
ISSUE DATE / TIME: 202401232338
Unit Type and Rh: 600
Unit Type and Rh: 600

## 2023-01-05 LAB — PREPARE FRESH FROZEN PLASMA
Unit division: 0
Unit division: 0

## 2023-01-05 LAB — GLUCOSE, CAPILLARY
Glucose-Capillary: 167 mg/dL — ABNORMAL HIGH (ref 70–99)
Glucose-Capillary: 207 mg/dL — ABNORMAL HIGH (ref 70–99)
Glucose-Capillary: 208 mg/dL — ABNORMAL HIGH (ref 70–99)
Glucose-Capillary: 235 mg/dL — ABNORMAL HIGH (ref 70–99)
Glucose-Capillary: 265 mg/dL — ABNORMAL HIGH (ref 70–99)
Glucose-Capillary: 286 mg/dL — ABNORMAL HIGH (ref 70–99)

## 2023-01-05 LAB — CBC
HCT: 23.2 % — ABNORMAL LOW (ref 39.0–52.0)
HCT: 25.4 % — ABNORMAL LOW (ref 39.0–52.0)
Hemoglobin: 7.8 g/dL — ABNORMAL LOW (ref 13.0–17.0)
Hemoglobin: 8.3 g/dL — ABNORMAL LOW (ref 13.0–17.0)
MCH: 30.8 pg (ref 26.0–34.0)
MCH: 29.7 pg (ref 26.0–34.0)
MCHC: 33.6 g/dL (ref 30.0–36.0)
MCHC: 32.7 g/dL (ref 30.0–36.0)
MCV: 91.7 fL (ref 80.0–100.0)
MCV: 91 fL (ref 80.0–100.0)
Platelets: 345 10*3/uL (ref 150–400)
Platelets: 314 10*3/uL (ref 150–400)
RBC: 2.53 MIL/uL — ABNORMAL LOW (ref 4.22–5.81)
RBC: 2.79 MIL/uL — ABNORMAL LOW (ref 4.22–5.81)
RDW: 12.6 % (ref 11.5–15.5)
RDW: 12.8 % (ref 11.5–15.5)
WBC: 15.3 10*3/uL — ABNORMAL HIGH (ref 4.0–10.5)
WBC: 14 10*3/uL — ABNORMAL HIGH (ref 4.0–10.5)
nRBC: 0 % (ref 0.0–0.2)
nRBC: 0.1 % (ref 0.0–0.2)

## 2023-01-05 LAB — BASIC METABOLIC PANEL
Anion gap: 10 (ref 5–15)
BUN: 16 mg/dL (ref 6–20)
CO2: 27 mmol/L (ref 22–32)
Calcium: 8.6 mg/dL — ABNORMAL LOW (ref 8.9–10.3)
Chloride: 96 mmol/L — ABNORMAL LOW (ref 98–111)
Creatinine, Ser: 0.97 mg/dL (ref 0.61–1.24)
GFR, Estimated: >60 mL/min (ref >60)
Glucose, Bld: 171 mg/dL — ABNORMAL HIGH (ref 70–99)
Potassium: 3.7 mmol/L (ref 3.5–5.1)
Sodium: 133 mmol/L — ABNORMAL LOW (ref 135–145)

## 2023-01-05 LAB — ECHO INTRAOPERATIVE TEE
Height: 67 in
Weight: 3844.82 oz

## 2023-01-05 LAB — PREPARE RBC (CROSSMATCH)

## 2023-01-05 LAB — PROTIME-INR
INR: 2.1 — ABNORMAL HIGH (ref 0.8–1.2)
Prothrombin Time: 23.5 seconds — ABNORMAL HIGH (ref 11.4–15.2)

## 2023-01-05 SURGERY — CREATION, PERICARDIAL WINDOW, SUBXIPHOID APPROACH
Anesthesia: General | Site: Mouth

## 2023-01-05 MED ORDER — ONDANSETRON HCL 4 MG/2ML IJ SOLN: 4.0000 mg | Freq: Four times a day (QID) | INTRAMUSCULAR | Status: DC | PRN

## 2023-01-05 MED ORDER — DEXAMETHASONE SODIUM PHOSPHATE 10 MG/ML IJ SOLN
INTRAMUSCULAR | Status: DC | PRN
  Administered 2023-01-05: 10 mg via INTRAVENOUS

## 2023-01-05 MED ORDER — FENTANYL CITRATE PF 50 MCG/ML IJ SOSY: 25.0000 ug | PREFILLED_SYRINGE | INTRAMUSCULAR | Status: DC | PRN

## 2023-01-05 MED ORDER — CHLORHEXIDINE GLUCONATE 0.12 % MT SOLN
OROMUCOSAL | Status: AC
  Administered 2023-01-05: 15 mL via OROMUCOSAL
  Filled 2023-01-05: qty 15

## 2023-01-05 MED ORDER — ONDANSETRON HCL 4 MG/2ML IJ SOLN
INTRAMUSCULAR | Status: DC | PRN
  Administered 2023-01-05: 4 mg via INTRAVENOUS

## 2023-01-05 MED ORDER — CEFAZOLIN SODIUM-DEXTROSE 2-4 GM/100ML-% IV SOLN
2.0000 g | INTRAVENOUS | Status: AC
  Administered 2023-01-05: 2 g via INTRAVENOUS
  Filled 2023-01-05: qty 100

## 2023-01-05 MED ORDER — SODIUM CHLORIDE 0.9 % IV SOLN: INTRAVENOUS | Status: DC

## 2023-01-05 MED ORDER — MIDAZOLAM HCL 2 MG/2ML IJ SOLN
INTRAMUSCULAR | Status: AC
  Filled 2023-01-05: qty 2

## 2023-01-05 MED ORDER — CHLORHEXIDINE GLUCONATE CLOTH 2 % EX PADS
6.0000 | MEDICATED_PAD | Freq: Every day | CUTANEOUS | Status: DC
  Administered 2023-01-05 – 2023-01-09 (×5): 6 via TOPICAL

## 2023-01-05 MED ORDER — FENTANYL CITRATE (PF) 250 MCG/5ML IJ SOLN
INTRAMUSCULAR | Status: DC | PRN
  Administered 2023-01-05: 50 ug via INTRAVENOUS
  Administered 2023-01-05: 100 ug via INTRAVENOUS
  Administered 2023-01-05 (×2): 25 ug via INTRAVENOUS

## 2023-01-05 MED ORDER — ORAL CARE MOUTH RINSE: 15.0000 mL | Freq: Once | OROMUCOSAL | Status: AC

## 2023-01-05 MED ORDER — ROCURONIUM BROMIDE 10 MG/ML (PF) SYRINGE
PREFILLED_SYRINGE | INTRAVENOUS | Status: DC | PRN
  Administered 2023-01-05: 30 mg via INTRAVENOUS

## 2023-01-05 MED ORDER — MIDAZOLAM HCL 5 MG/5ML IJ SOLN
INTRAMUSCULAR | Status: DC | PRN
  Administered 2023-01-05 (×2): 1 mg via INTRAVENOUS

## 2023-01-05 MED ORDER — CHLORHEXIDINE GLUCONATE 0.12 % MT SOLN: 15.0000 mL | Freq: Once | OROMUCOSAL | Status: AC

## 2023-01-05 MED ORDER — METOPROLOL TARTRATE 12.5 MG HALF TABLET
ORAL_TABLET | ORAL | Status: AC
  Administered 2023-01-05: 12.5 mg via ORAL
  Filled 2023-01-05: qty 1

## 2023-01-05 MED ORDER — LACTATED RINGERS IV SOLN: INTRAVENOUS | Status: DC

## 2023-01-05 MED ORDER — METOPROLOL TARTRATE 12.5 MG HALF TABLET
12.5000 mg | ORAL_TABLET | Freq: Two times a day (BID) | ORAL | Status: DC
  Administered 2023-01-05: 12.5 mg via ORAL
  Filled 2023-01-05: qty 1

## 2023-01-05 MED ORDER — PROPOFOL 10 MG/ML IV BOLUS
INTRAVENOUS | Status: AC
  Filled 2023-01-05: qty 20

## 2023-01-05 MED ORDER — PANTOPRAZOLE SODIUM 40 MG PO TBEC
40.0000 mg | DELAYED_RELEASE_TABLET | Freq: Every day | ORAL | Status: DC
  Administered 2023-01-06 – 2023-01-09 (×4): 40 mg via ORAL
  Filled 2023-01-05 (×4): qty 1

## 2023-01-05 MED ORDER — FENTANYL CITRATE (PF) 250 MCG/5ML IJ SOLN
INTRAMUSCULAR | Status: AC
  Filled 2023-01-05: qty 5

## 2023-01-05 MED ORDER — ONDANSETRON HCL 4 MG/2ML IJ SOLN
INTRAMUSCULAR | Status: AC
  Filled 2023-01-05: qty 2

## 2023-01-05 MED ORDER — CEFAZOLIN SODIUM-DEXTROSE 2-4 GM/100ML-% IV SOLN
2.0000 g | Freq: Three times a day (TID) | INTRAVENOUS | Status: AC
  Administered 2023-01-05 (×2): 2 g via INTRAVENOUS
  Filled 2023-01-05 (×2): qty 100

## 2023-01-05 MED ORDER — TRAMADOL HCL 50 MG PO TABS
50.0000 mg | ORAL_TABLET | Freq: Four times a day (QID) | ORAL | Status: DC | PRN
  Administered 2023-01-05 – 2023-01-08 (×3): 100 mg via ORAL
  Filled 2023-01-05: qty 1
  Filled 2023-01-05 (×3): qty 2

## 2023-01-05 MED ORDER — SUGAMMADEX SODIUM 200 MG/2ML IV SOLN
INTRAVENOUS | Status: DC | PRN
  Administered 2023-01-05: 200 mg via INTRAVENOUS

## 2023-01-05 MED ORDER — CHLORHEXIDINE GLUCONATE CLOTH 2 % EX PADS: 6.0000 | MEDICATED_PAD | Freq: Every day | CUTANEOUS | Status: DC

## 2023-01-05 MED ORDER — INSULIN ASPART 100 UNIT/ML IJ SOLN
0.0000 [IU] | INTRAMUSCULAR | Status: DC
  Administered 2023-01-05: 12 [IU] via SUBCUTANEOUS
  Administered 2023-01-05: 8 [IU] via SUBCUTANEOUS
  Administered 2023-01-05: 12 [IU] via SUBCUTANEOUS
  Administered 2023-01-06 (×2): 8 [IU] via SUBCUTANEOUS

## 2023-01-05 MED ORDER — PROPOFOL 10 MG/ML IV BOLUS
INTRAVENOUS | Status: DC | PRN
  Administered 2023-01-05: 30 mg via INTRAVENOUS
  Administered 2023-01-05: 100 mg via INTRAVENOUS

## 2023-01-05 MED ORDER — SODIUM CHLORIDE 0.9 % IV SOLN: INTRAVENOUS | Status: DC | PRN

## 2023-01-05 MED ORDER — METOPROLOL TARTRATE 12.5 MG HALF TABLET: 12.5000 mg | ORAL_TABLET | Freq: Once | ORAL | Status: AC

## 2023-01-05 MED ORDER — DEXAMETHASONE SODIUM PHOSPHATE 10 MG/ML IJ SOLN
INTRAMUSCULAR | Status: AC
  Filled 2023-01-05: qty 1

## 2023-01-05 MED ORDER — METOCLOPRAMIDE HCL 5 MG/ML IJ SOLN
10.0000 mg | Freq: Four times a day (QID) | INTRAMUSCULAR | Status: AC
  Administered 2023-01-05 – 2023-01-06 (×4): 10 mg via INTRAVENOUS
  Filled 2023-01-05 (×4): qty 2

## 2023-01-05 MED ORDER — SUCCINYLCHOLINE CHLORIDE 200 MG/10ML IV SOSY
PREFILLED_SYRINGE | INTRAVENOUS | Status: DC | PRN
  Administered 2023-01-05: 100 mg via INTRAVENOUS

## 2023-01-05 MED ORDER — 0.9 % SODIUM CHLORIDE (POUR BTL) OPTIME
TOPICAL | Status: DC | PRN
  Administered 2023-01-05: 2000 mL

## 2023-01-05 MED ORDER — ACETAMINOPHEN 160 MG/5ML PO SOLN: 1000.0000 mg | Freq: Four times a day (QID) | ORAL | Status: DC

## 2023-01-05 MED ORDER — OXYCODONE HCL 5 MG PO TABS
5.0000 mg | ORAL_TABLET | ORAL | Status: DC | PRN
  Administered 2023-01-05 – 2023-01-06 (×2): 10 mg via ORAL
  Filled 2023-01-05 (×2): qty 2

## 2023-01-05 MED ORDER — ACETAMINOPHEN 500 MG PO TABS
1000.0000 mg | ORAL_TABLET | Freq: Four times a day (QID) | ORAL | Status: DC
  Administered 2023-01-05 – 2023-01-09 (×17): 1000 mg via ORAL
  Filled 2023-01-05 (×18): qty 2

## 2023-01-05 MED FILL — Electrolyte-R (PH 7.4) Solution: INTRAVENOUS | Qty: 5000 | Status: AC

## 2023-01-05 MED FILL — Sodium Bicarbonate IV Soln 8.4%: INTRAVENOUS | Qty: 50 | Status: AC

## 2023-01-05 MED FILL — Mannitol IV Soln 20%: INTRAVENOUS | Qty: 500 | Status: AC

## 2023-01-05 MED FILL — Sodium Chloride IV Soln 0.9%: INTRAVENOUS | Qty: 2000 | Status: AC

## 2023-01-05 MED FILL — Potassium Chloride Inj 2 mEq/ML: INTRAVENOUS | Qty: 40 | Status: AC

## 2023-01-05 MED FILL — Heparin Sodium (Porcine) Inj 1000 Unit/ML: Qty: 1000 | Status: AC

## 2023-01-05 MED FILL — Lidocaine HCl Local Preservative Free (PF) Inj 2%: INTRAMUSCULAR | Qty: 14 | Status: AC

## 2023-01-05 SURGICAL SUPPLY — 56 items
BLADE CLIPPER SURG (BLADE) ×2 IMPLANT
CANISTER SUCT 3000ML PPV (MISCELLANEOUS) ×2 IMPLANT
CATH THORACIC 28FR (CATHETERS) IMPLANT
CATH THORACIC 28FR RT ANG (CATHETERS) IMPLANT
CATH THORACIC 36FR (CATHETERS) IMPLANT
CATH THORACIC 36FR RT ANG (CATHETERS) IMPLANT
CLIP TI MEDIUM 24 (CLIP) IMPLANT
CLIP TI WIDE RED SMALL 24 (CLIP) IMPLANT
CNTNR URN SCR LID CUP LEK RST (MISCELLANEOUS) ×2 IMPLANT
CONN ST 1/4X3/8  BEN (MISCELLANEOUS) ×2
CONN ST 1/4X3/8 BEN (MISCELLANEOUS) IMPLANT
CONT SPEC 4OZ STRL OR WHT (MISCELLANEOUS) ×2
DRAIN CHANNEL 28F RND 3/8 FF (WOUND CARE) IMPLANT
DRAIN CHANNEL 32F RND 10.7 FF (WOUND CARE) IMPLANT
DRAPE LAPAROSCOPIC ABDOMINAL (DRAPES) ×2 IMPLANT
DRAPE WARM FLUID 44X44 (DRAPES) ×2 IMPLANT
ELECT REM PT RETURN 9FT ADLT (ELECTROSURGICAL) ×2
ELECTRODE REM PT RTRN 9FT ADLT (ELECTROSURGICAL) ×2 IMPLANT
GAUZE 4X4 16PLY ~~LOC~~+RFID DBL (SPONGE) ×2 IMPLANT
GAUZE SPONGE 4X4 12PLY STRL (GAUZE/BANDAGES/DRESSINGS) ×2 IMPLANT
GLOVE ECLIPSE 7.0 STRL STRAW (GLOVE) IMPLANT
GLOVE SURG MICRO LTX SZ7 (GLOVE) ×2 IMPLANT
GOWN STRL REUS W/ TWL LRG LVL3 (GOWN DISPOSABLE) ×2 IMPLANT
GOWN STRL REUS W/ TWL XL LVL3 (GOWN DISPOSABLE) ×2 IMPLANT
GOWN STRL REUS W/TWL LRG LVL3 (GOWN DISPOSABLE) ×2
GOWN STRL REUS W/TWL XL LVL3 (GOWN DISPOSABLE) ×2
KIT BASIN OR (CUSTOM PROCEDURE TRAY) ×2 IMPLANT
KIT TURNOVER KIT B (KITS) ×2 IMPLANT
NS IRRIG 1000ML POUR BTL (IV SOLUTION) ×2 IMPLANT
PACK CHEST (CUSTOM PROCEDURE TRAY) ×2 IMPLANT
PAD ARMBOARD 7.5X6 YLW CONV (MISCELLANEOUS) ×4 IMPLANT
PAD ELECT DEFIB RADIOL ZOLL (MISCELLANEOUS) ×2 IMPLANT
PENCIL BUTTON HOLSTER BLD 10FT (ELECTRODE) IMPLANT
SPONGE T-LAP 18X18 ~~LOC~~+RFID (SPONGE) ×8 IMPLANT
SPONGE T-LAP 4X18 ~~LOC~~+RFID (SPONGE) ×2 IMPLANT
SUT SILK  1 MH (SUTURE) ×4
SUT SILK 1 MH (SUTURE) IMPLANT
SUT VIC AB 0 CT1 18XCR BRD 8 (SUTURE) ×2 IMPLANT
SUT VIC AB 0 CT1 8-18 (SUTURE) ×4
SUT VIC AB 1 CTX 18 (SUTURE) IMPLANT
SUT VIC AB 1 CTX 36 (SUTURE) ×2
SUT VIC AB 1 CTX36XBRD ANBCTR (SUTURE) IMPLANT
SUT VIC AB 2-0 CT1 27 (SUTURE)
SUT VIC AB 2-0 CT1 TAPERPNT 27 (SUTURE) IMPLANT
SUT VIC AB 3-0 X1 27 (SUTURE) IMPLANT
SWAB COLLECTION DEVICE MRSA (MISCELLANEOUS) IMPLANT
SWAB CULTURE ESWAB REG 1ML (MISCELLANEOUS) IMPLANT
SYR 10ML LL (SYRINGE) IMPLANT
SYR 50ML SLIP (SYRINGE) IMPLANT
SYSTEM SAHARA CHEST DRAIN ATS (WOUND CARE) IMPLANT
TAPE CLOTH SURG 4X10 WHT LF (GAUZE/BANDAGES/DRESSINGS) IMPLANT
TOWEL GREEN STERILE (TOWEL DISPOSABLE) ×2 IMPLANT
TOWEL GREEN STERILE FF (TOWEL DISPOSABLE) ×2 IMPLANT
TRAP SPECIMEN MUCUS 40CC (MISCELLANEOUS) ×2 IMPLANT
TRAY FOLEY SLVR 16FR TEMP STAT (SET/KITS/TRAYS/PACK) ×2 IMPLANT
WATER STERILE IRR 1000ML POUR (IV SOLUTION) ×4 IMPLANT

## 2023-01-05 NOTE — Anesthesia Postprocedure Evaluation (Signed)
Anesthesia Post Note  Patient: Douglas Phillips  Procedure(s) Performed: SUBXYPHOID PERICARDIAL WINDOW (Chest) TRANSESOPHAGEAL ECHOCARDIOGRAM (Mouth)     Patient location during evaluation: PACU Anesthesia Type: General Level of consciousness: awake and alert Pain management: pain level controlled Vital Signs Assessment: post-procedure vital signs reviewed and stable Respiratory status: spontaneous breathing, nonlabored ventilation, respiratory function stable and patient connected to nasal cannula oxygen Cardiovascular status: blood pressure returned to baseline and stable Postop Assessment: no apparent nausea or vomiting Anesthetic complications: no  No notable events documented.  Last Vitals:  Vitals:   01/05/23 1159 01/05/23 1200  BP:  (!) 142/76  Pulse: 69 70  Resp: (!) 22 20  Temp:    SpO2: 94% 94%    Last Pain:  Vitals:   01/05/23 1138  TempSrc:   PainSc: 0-No pain                 Irving Lubbers,W. EDMOND

## 2023-01-05 NOTE — Progress Notes (Signed)
Echocardiogram Echocardiogram Transesophageal has been performed.  Douglas Phillips 01/05/2023, 10:06 AM

## 2023-01-05 NOTE — Discharge Summary (Signed)
Physician Discharge Summary       [address] [place].[address]       [name] [address]             [phone]    Patient ID: Douglas Phillips MRN: [id number] DOB/AGE: [birth year] 45 y.o.  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 11/21/2019   OSA on CPAP 03/12/2019   Retrognathia 03/12/2019   Recent weight loss 03/12/2019   DM (diabetes mellitus) (HCC) 02/01/2019   Hyperlipidemia 02/01/2019   Migraine 02/01/2019   OSA (obstructive sleep apnea) 01/22/2010   NEPHROLITHIASIS, HX OF 04/05/2007    Consults: None  Procedure (s): surgery CARDIOVASCULAR SURGERY OPERATIVE NOTE   01/05/2023   Surgeon:  Alleen Borne, MD   First Assistant: RNFA     Preoperative Diagnosis: Moderate-sized pericardial effusion   Postoperative Diagnosis: Same   Procedure:  Subxyphoid pericardial window     Anesthesia:  General Endotracheal HPI:    The patient is a 47 year old gentleman who underwent replacement of ascending aorta under deep hypothermic circulatory arrest using a 28 x 10 mm Hemashield Platinum graft and Bentall procedure using a 27/29 mm On-X mechanical valved graft on 12/27/2022. He had marked nausea postop that resolved after a day and was likely related to anesthesia and diabetic gastroparesis. He had an otherwise unremarkable postop course and was discharged on 01/01/23 on Coumadin 2.5 mg daily. His last INR was 2.3 on the day of discharge and he has not had it checked this week  yet. According to his wife he developed nausea and vomiting about 3 am today and he was seen in our office today for chest tube suture removal. He was very nauseous all day. In the office BP was 97/68 which is low for him since he is usually hypertensive. His BP on the morning of discharge was 154/74. He has also developed left shoulder pain. I was concerned about the possibility of a pericardial effusion and had him return for an echo which I discussed with Dr. Royann Shivers and shows a moderate sized pericardial effusion that is circumferential and may have a blood component. There may be early signs of tamponade. Prosthetic aortic valve is functioning normally and LVEF normal. RV function normal. CXR shows large cardiac shadow and Left base atelectasis, possible pleural effusion.  He was felt to require admission for pericardial window.   Hospital course:  The patient was taken the operating room 01/05/2023 and subsequently underwent  pericardial window.  He tolerated procedure well was taken to the postanesthesia care unit in stable condition.    Postoperative hospital course:  He has remained hemodynamically stable.  He does have some sinus bradycardia and Lopressor is being held.  Chest tube had moderate drainage and is being kept in place.  INR on postop day 1 was 2.8 and Coumadin is currently on hold.  The patient continues to have a poor appetite.  He is started on a course of routine pulmonary hygiene and ambulation protocols.  Art line was removed on postop day #1.  On postoperative day#2  one of the chest tubes was removed.  The second tube was removed the following day.  He has been started on colchicine on for pericarditis component of the pericardial effusion.  Coumadin has been resumed for his mechanical On-X aortic valve.  INRs are being checked daily.  He has a follow-up appointment at the Coumadin clinic on 01/11/2023.  Sugars have been under adequate control.  Incisions were noted to be healing  well without evidence of infection.  His overall progress was felt to be excellent and at the time of discharge she was quite stable.        Latest Vital Signs: Blood pressure 124/71, pulse 73, temperature 98.6 F (37 C), temperature source Oral, resp. rate (!) 28, height 5\' 7"  (1.702 m), weight 107.9 kg, SpO2 96 %.  Physical Exam: Blood pressure 124/71, pulse 73, temperature 98.6 F (37 C), temperature source Oral, resp. rate (!) 28, height 5\' 7"  (1.702 m), weight 107.9 kg, SpO2 96 %.    - Abd: ND - Extremity: trace Incision: Healing well without evidence of infection Discharge Condition: Good  Recent laboratory studies:  Lab Results  Component Value Date   WBC 10.6 (H) 01/09/2023   HGB 9.1 (L) 01/09/2023   HCT 26.7 (L) 01/09/2023   MCV 88.1 01/09/2023   PLT 405 (H) 01/09/2023   Lab Results  Component Value Date   NA 136 01/09/2023   K 3.6 01/09/2023   CL 100 01/09/2023   CO2 28 01/09/2023   CREATININE 0.84 01/09/2023   GLUCOSE 216 (H) 01/09/2023      Diagnostic Studies: DG Chest Port 1 View  Result Date: 01/06/2023 CLINICAL DATA:  Pericardial effusion. EXAM: PORTABLE CHEST 1 VIEW COMPARISON:  One-view chest x-ray FINDINGS: The cardiopericardial similar the prior study. Straightening the heart border suggesting pericardial effusion. Pericardial drainage catheters are in place. Aortic valve noted. Right is stable. Retrocardiac opacification similar to slightly improved prior exam. Minimal right effusion is suspected. IMPRESSION: 1. Stable appearance of the cardiopericardial silhouette suggesting residual pericardial effusion. 2. Pericardial drainage catheters in place. 3. Stable retrocardiac opacification. Electronically Signed   By: San Morelle M.D.   On: 01/06/2023 08:22   DG Chest Port 1 View  Result Date: 01/05/2023 CLINICAL DATA:  Pericardial effusion. History of recent Bentall procedure EXAM: PORTABLE CHEST 1 VIEW COMPARISON:  Multiple prior chest x-rays.  FINDINGS: Stable prominent cardiopericardial silhouette. Prosthetic aortic valve again noted. New pericardial drainage catheters are noted. Persistent left pleural effusion and overlying atelectasis. Mild vascular congestion without overt pulmonary edema. No pneumothorax. IMPRESSION: 1. Persistent left pleural effusion and overlying atelectasis. 2. Mild vascular congestion without overt pulmonary edema. Electronically Signed   By: Marijo Sanes M.D.   On: 01/05/2023 11:46   EP STUDY  Result Date: 01/05/2023 See surgical note for result.  Portable chest 1 View  Result Date: 01/04/2023 CLINICAL DATA:  Pericardial effusion EXAM: PORTABLE CHEST 1 VIEW COMPARISON:  CT 11/22/2012, chest x-ray 12/28/2022, 12/23/2022 FINDINGS: Post sternotomy changes with aortic valve replacement. Right IJ catheter sheath and mediastinal drainage catheter has been removed. Moderate to marked cardiomegaly with slight central congestion. Small left effusion with airspace disease at the left base. No visible pneumothorax IMPRESSION: Moderate to marked cardiomegaly with slight central congestion. Small left effusion with airspace disease at the left base, no significant change since 12/29/2022. Removal of right IJ catheter sheath and drainage catheters Electronically Signed   By: Donavan Foil M.D.   On: 01/04/2023 23:55   ECHOCARDIOGRAM COMPLETE  Result Date: 01/04/2023    ECHOCARDIOGRAM REPORT   Patient Name:   Douglas Phillips Date of Exam: 01/04/2023 Medical Rec #:  [id number]          Height:       67.0 in Accession #:    [id number]         Weight:       240.2 lb Date of Birth:  29-Nov-1976           BSA:          2.186 m Patient Age:    46 years           BP:           120/74 mmHg Patient Gender: M                  HR:           89 bpm. Exam Location:  Outpatient Procedure: Limited Echo, Limited Color Doppler and Cardiac Doppler Indications:    pericardial effusion  History:        Patient has prior history of Echocardiogram  examinations, most                 recent 10/04/2022. CAD, status post Bentall surgery; Risk                 Factors:Hypertension, Dyslipidemia, Sleep Apnea and Diabetes.                 Aortic Valve: 27-29 On-X Bentall mechanical valve is present in                 the aortic position. Procedure Date: 12/27/2022.  Sonographer:    Delcie Roch RDCS Referring Phys: [id number]oughty BARTLE  Sonographer Comments: Image acquisition challenging due to patient body habitus and patient vomitting. IMPRESSIONS  1. Left ventricular ejection fraction, by estimation, is 65 to 70%. The left ventricle has normal function. The left ventricle has no regional wall motion abnormalities. There is mild concentric left ventricular hypertrophy. Left ventricular diastolic parameters are consistent with Grade II diastolic dysfunction (pseudonormalization). Elevated left atrial pressure.  2. Right ventricular systolic function is normal. The right ventricular size is normal. Tricuspid regurgitation signal is inadequate for assessing PA pressure.  3. Around the RV free wall and apex the pericardial fluid appears to be denser, maybe with echogenicity of clot. The inferior vena cava appears plethoric, but there is no diastolic chamber collapse. There is only mildly exaggerated respiratory flow variation across the AV valves (mitral flow variation approximately 20%), but in some images there is subtle respiratory displacement of the ventricular septum. Finally there is a suggestion of pulsus paradoxus when evaluating the flow velocity across the pulmonary and aortic valves. Moderate pericardial effusion. The pericardial effusion is circumferential.  4. The mitral valve is normal in structure. No evidence of mitral valve regurgitation.  5. The aortic valve has been repaired/replaced. Aortic valve regurgitation is not visualized. There is a 27-29 On-X Bentall mechanical valve present in the aortic position. Procedure Date: 12/27/2022. Echo  findings are consistent with normal structure and function of the aortic valve prosthesis.  6. Graft repair of the ascending aorta appears intact. Aortic root/ascending aorta has been repaired/replaced.  7. The inferior vena cava is dilated in size with <50% respiratory variability, suggesting right atrial pressure of 15 mmHg. Comparison(s): Compared to the postoperative images the pericardial effusion is new. There are subtle signs of ventricular interdependence, worrisome for early tamponade  physiology. FINDINGS  Left Ventricle: Left ventricular ejection fraction, by estimation, is 65 to 70%. The left ventricle has normal function. The left ventricle has no regional wall motion abnormalities. The left ventricular internal cavity size was normal in size. There is  mild concentric left ventricular hypertrophy. Abnormal (paradoxical) septal motion consistent with post-operative status. Left ventricular diastolic parameters are consistent with Grade II diastolic dysfunction (pseudonormalization). Elevated left atrial pressure. Right Ventricle: The right ventricular size is normal. No increase in right ventricular wall thickness. Right ventricular systolic function is normal. Tricuspid regurgitation signal is inadequate for assessing PA pressure. Left Atrium: Left atrial size was normal in size. Right Atrium: Right atrial size was not well visualized. Pericardium: Around the RV free wall and apex the pericardial fluid appears to be denser, maybe with echogenicity of clot. The inferior vena cava appears plethoric, but there is no diastolic chamber collapse. There is only mildly exaggerated respiratory flow variation across the AV valves (mitral flow variation approximately 20%), but in some images there is subtle respiratory displacement of the ventricular septum. Finally there is a suggestion of pulsus paradoxus when evaluating the flow velocity across the pulmonary and aortic valves. A moderately sized pericardial  effusion is present. The pericardial effusion is circumferential. The pericardial effusion appears to contain mixed echogenic material. Mitral Valve: The mitral valve is normal in structure. No evidence of mitral valve regurgitation. Tricuspid Valve: The tricuspid valve is grossly normal. Tricuspid valve regurgitation is not demonstrated. Aortic Valve: The aortic valve has been repaired/replaced. Aortic valve regurgitation is not visualized. Aortic valve mean gradient measures 8.0 mmHg. Aortic valve peak gradient measures 16.2 mmHg. Aortic valve area, by VTI measures 1.82 cm. There is a 27-29 On-X Bentall mechanical valve present in the aortic position. Procedure Date: 12/27/2022. Echo findings are consistent with normal structure and function of the aortic valve prosthesis. Pulmonic Valve: The pulmonic valve was not well visualized. Pulmonic valve regurgitation is not visualized. Aorta: Graft repair of the ascending aorta appears intact. The aortic root/ascending aorta has been repaired/replaced. Venous: The inferior vena cava is dilated in size with less than 50% respiratory variability, suggesting right atrial pressure of 15 mmHg. IAS/Shunts: The interatrial septum was not well visualized.  LEFT VENTRICLE PLAX 2D LVIDd:         3.80 cm LVIDs:         2.40 cm LV PW:         1.50 cm LV IVS:        1.40 cm LVOT diam:     1.90 cm LV SV:         56 LV SV Index:   26 LVOT Area:     2.84 cm  IVC IVC diam: 2.30 cm LEFT ATRIUM         Index LA diam:    3.50 cm 1.60 cm/m  AORTIC VALVE AV Area (Vmax):    1.98 cm AV Area (Vmean):   1.87 cm AV Area (VTI):     1.82 cm AV Vmax:           201.00 cm/s AV Vmean:          132.000 cm/s AV VTI:            0.308 m AV Peak Grad:      16.2 mmHg AV Mean Grad:      8.0 mmHg LVOT Vmax:         140.50 cm/s LVOT Vmean:        86.850  cm/s LVOT VTI:          0.198 m LVOT/AV VTI ratio: 0.64  AORTA Ao Root diam: 3.20 cm MITRAL VALVE MV Area (PHT): 3.72 cm    SHUNTS MV Decel Time: 204 msec     Systemic VTI:  0.20 m MV E velocity: 94.40 cm/s  Systemic Diam: 1.90 cm MV A velocity: 73.90 cm/s MV E/A ratio:  1.28 Mihai Croitoru MD Electronically signed by Thurmon Fair MD Signature Date/Time: 01/04/2023/5:25:21 PM    Final    DG Chest Port 1 View  Result Date: 12/29/2022 CLINICAL DATA:  Status post AVR EXAM: PORTABLE CHEST 1 VIEW COMPARISON:  Chest x-ray December 28, 2022, chest CT November 22, 2022 FINDINGS: Interval removal of the right approach PA catheter. A right central venous catheter remains in place, terminating in the low SVC. Stable positioning of mediastinal drains. Stable cardiomediastinal contours status post median sternotomy and aortic valve replacement. Slightly increased left basilar/retrocardiac opacity, likely a combination of consolidation and pleural fluid. No focal opacity in the right lung. No large pneumothorax. No acute osseous abnormality. The visualized upper abdomen is unremarkable. IMPRESSION: 1. Slightly increased left basilar/retrocardiac opacity, likely atelectasis and pleural fluid. Superimposed infection is not excluded. 2. Unchanged cardiomegaly. Electronically Signed   By: Jacob Moores M.D.   On: 12/29/2022 08:33   EP STUDY  Result Date: 12/28/2022 See surgical note for result.  DG Chest Port 1 View  Result Date: 12/28/2022 CLINICAL DATA:  Aortic aneurysm. EXAM: PORTABLE CHEST 1 VIEW COMPARISON:  Chest x-ray dated 12/27/2022. FINDINGS: Endotracheal tube and enteric tube have been removed. Swan-Ganz catheter is in place with tip just to the RIGHT of midline. Mediastinal drains in place. Median sternotomy wires appear intact and stable in alignment. Probable mild atelectasis and/or small pleural effusion at the LEFT lung base. Lungs otherwise clear. No pneumothorax is seen. IMPRESSION: 1. Probable mild atelectasis and/or small pleural effusion at the LEFT lung base. Lungs otherwise clear. No pneumothorax. 2. Support apparatus in place, as above. 3.  Endotracheal tube and enteric tube have been removed. Electronically Signed   By: Bary Richard M.D.   On: 12/28/2022 08:15   ECHO INTRAOPERATIVE TEE  Result Date: 12/27/2022  *INTRAOPERATIVE TRANSESOPHAGEAL REPORT *  Patient Name:   Douglas Phillips Date of Exam: 12/27/2022 Medical Rec #:  [id number]          Height:       67.0 in Accession #:    [id number]         Weight:       245.0 lb Date of Birth:  [birth year]           BSA:          2.20 m Patient Age:    46 years           BP:           124/76 mmHg Patient Gender: M                  HR:           48 bpm. Exam Location:  Anesthesiology Transesophogeal exam was perform intraoperatively during surgical procedure. Patient was closely monitored under general anesthesia during the entirety of examination. Indications:     AV insufficiency/Ascending aortic aneurysm Performing Phys: Karna Christmas, MD Complications: No known complications during this procedure. POST-OP IMPRESSIONS _ Left Ventricle: The left ventricle is unchanged from pre-bypass. _ Right Ventricle: The right ventricle appears unchanged from pre-bypass. _  Aorta: A graft was placed in the ascending aorta for repair. _ Left Atrial Appendage: The left atrial appendage appears unchanged from pre-bypass. _ Aortic Valve: No stenosis present. There is trivial regurgitation. The gradient recorded across the prosthetic valve is within the expected range. No perivalvular leak noted. _ Mitral Valve: The mitral valve appears unchanged from pre-bypass. _ Tricuspid Valve: The tricuspid valve appears unchanged from pre-bypass. _ Pulmonic Valve: The pulmonic valve appears unchanged from pre-bypass. _ Interatrial Septum: The interatrial septum appears unchanged from pre-bypass. _ Pericardium: The pericardium appears unchanged from pre-bypass. PRE-OP FINDINGS  Left Ventricle: The left ventricle has normal systolic function, with an ejection fraction of 55-60%. The cavity size was normal. There is no increase in left  ventricular wall thickness. Right Ventricle: The right ventricle has normal systolic function. The cavity was normal. There is no increase in right ventricular wall thickness. Left Atrium: Left atrial size was normal in size. No left atrial/left atrial appendage thrombus was detected. Right Atrium: Right atrial size was normal in size. Interatrial Septum: No atrial level shunt detected by color flow Doppler. Pericardium: There is no evidence of pericardial effusion. Mitral Valve: The mitral valve is normal in structure. Mitral valve regurgitation is trivial by color flow Doppler. Tricuspid Valve: The tricuspid valve was normal in structure. Tricuspid valve regurgitation was not visualized by color flow Doppler. Aortic Valve: The aortic valve is bicuspid. Aortic valve regurgitation is mild by color flow Doppler. The jet is eccentric and directed posteriorly. There is no stenosis of the aortic valve. Pulmonic Valve: The pulmonic valve was normal in structure. Pulmonic valve regurgitation is not visualized by color flow Doppler. Aorta: The aortic arch and descending aorta are normal in size and structure. There is an aneurysm involving the aortic root measuring 51 mm. There is an aneurysm involving the ascending aorta measuring 42 mm.  Karna Christmasyan Ellender MD Electronically signed by Karna Christmasyan Ellender MD Signature Date/Time: 12/27/2022/3:00:08 PM    Final    DG Chest Port 1 View  Result Date: 12/27/2022 CLINICAL DATA:  Postoperative day 0, Bentall procedure for thoracic aortic aneurysm. EXAM: PORTABLE CHEST 1 VIEW COMPARISON:  12/23/2022 FINDINGS: The endotracheal tube is 0.9 cm above the carina; consider retracting 2 cm for safety. Nasogastric tube enters the stomach. Mediastinal drains noted. Aortic valve prosthesis observed. Moderate enlargement of the cardiopericardial silhouette noted. Indistinct pulmonary vasculature may reflect pulmonary venous hypertension. No overt pulmonary edema. Minimal hazy retrocardiac density on  the left, favoring atelectasis in the left lower lobe. No pneumothorax.  Equivocal trace pneumomediastinum. IMPRESSION: 1. Moderate enlargement of the cardiopericardial silhouette with pulmonary venous hypertension but no overt edema. 2. The endotracheal tube is 0.9 cm above the carina; consider retracting 2 cm for safety. 3. Mediastinal drains and aortic valve prosthesis noted. 4. Equivocal trace pneumomediastinum. 5. Mild hazy retrocardiac density on the left, favoring atelectasis in the left lower lobe. Electronically Signed   By: Gaylyn RongWalter  Liebkemann M.D.   On: 12/27/2022 14:43   DG Chest 2 View  Result Date: 12/24/2022 CLINICAL DATA:  Preoperative chest x-ray EXAM: CHEST - 2 VIEW COMPARISON:  None Available. FINDINGS: The cardiac silhouette is enlarged. The hila and mediastinum are normal. No pneumothorax. No nodules or masses. No focal infiltrates. IMPRESSION: The cardiac silhouette is enlarged. No other abnormalities. Electronically Signed   By: Gerome Samavid  Williams III M.D.   On: 12/24/2022 15:51   VAS US DOPPLER PRE CABG  Result Date: 12/23/2022 PREOPERATIVE VASCULAR EVALUATION Patient Name:  Douglas Phillips  Date of Exam:   12/23/2022 Medical Rec #: [id number]           Accession #:    [id number] Date of Birth: [birth year]            Patient Gender: M Patient Age:   70 years Exam Location[facility]  [facility] Procedure:      VAS US DOPPLER PRE CABG Referring Phys: Evelene Croon --------------------------------------------------------------------------------  Indications:      Pre-CABG. Risk Factors:     Hyperlipidemia, Diabetes. Other Factors:    Bicuspid aortic valve, dilated aortic root, pre operative for                   Aortic valve replacement and Bentall procedure. Comparison Study: No prior study Performing Technologist: Sherren Kerns RVS  Examination Guidelines: A complete evaluation includes B-mode imaging, spectral Doppler, color Doppler, and power Doppler as needed of all accessible portions of  each vessel. Bilateral testing is considered an integral part of a complete examination. Limited examinations for reoccurring indications may be performed as noted.  Right Carotid Findings: +----------+--------+--------+--------+--------+------------------+           PSV cm/sEDV cm/sStenosisDescribeComments           +----------+--------+--------+--------+--------+------------------+ CCA Prox  97      18                      intimal thickening +----------+--------+--------+--------+--------+------------------+ CCA Distal74      17                      intimal thickening +----------+--------+--------+--------+--------+------------------+ ICA Prox  59      20                                         +----------+--------+--------+--------+--------+------------------+ ICA Mid   78      22                                         +----------+--------+--------+--------+--------+------------------+ ECA       84      12                                         +----------+--------+--------+--------+--------+------------------+ +----------+--------+-------+--------+------------+           PSV cm/sEDV cmsDescribeArm Pressure +----------+--------+-------+--------+------------+ Subclavian121                                 +----------+--------+-------+--------+------------+ +---------+--------+--+--------+--+ VertebralPSV cm/s57EDV cm/s18 +---------+--------+--+--------+--+ Left Carotid Findings: +----------+--------+--------+--------+--------+------------------+           PSV cm/sEDV cm/sStenosisDescribeComments           +----------+--------+--------+--------+--------+------------------+ CCA Prox  99      13                      intimal thickening +----------+--------+--------+--------+--------+------------------+ CCA Distal63      17                      intimal thickening +----------+--------+--------+--------+--------+------------------+ ICA  Prox  67      28                                         +----------+--------+--------+--------+--------+------------------+  ICA Mid   84      27                                         +----------+--------+--------+--------+--------+------------------+ ICA Distal60      20                                         +----------+--------+--------+--------+--------+------------------+ ECA       75      14                                         +----------+--------+--------+--------+--------+------------------+  +----------+--------+--------+--------+------------+ SubclavianPSV cm/sEDV cm/sDescribeArm Pressure +----------+--------+--------+--------+------------+           107                                  +----------+--------+--------+--------+------------+ +---------+--------+--+--------+--+ VertebralPSV cm/s50EDV cm/s12 +---------+--------+--+--------+--+  Right Doppler Findings: +------+--------+-----+---------+--------+ Site  PressureIndexDoppler  Comments +------+--------+-----+---------+--------+ Radial             triphasic         +------+--------+-----+---------+--------+ Ulnar              triphasic         +------+--------+-----+---------+--------+  Left Doppler Findings: +------+--------+-----+---------+--------+ Site  PressureIndexDoppler  Comments +------+--------+-----+---------+--------+ Radial             triphasic         +------+--------+-----+---------+--------+ Ulnar              triphasic         +------+--------+-----+---------+--------+   Summary: Right Carotid: The extracranial vessels were near-normal with only minimal wall                thickening or plaque. Left Carotid: The extracranial vessels were near-normal with only minimal wall               thickening or plaque. Vertebrals:  Bilateral vertebral arteries demonstrate antegrade flow. Subclavians: Normal flow hemodynamics were seen in bilateral subclavian               arteries. Right Upper Extremity: Doppler waveforms remain within normal limits with right radial compression. Doppler waveforms remain within normal limits with right ulnar compression. Left Upper Extremity: Doppler waveforms remain within normal limits with left radial compression. Doppler waveforms remain within normal limits with left ulnar compression.  Electronically signed by Jamelle Haring on 12/23/2022 at 4:45:47 PM.    Final    CARDIAC CATHETERIZATION  Result Date: 12/17/2022   Dist RCA-1 lesion is 40% stenosed.   Dist RCA-2 lesion is 30% stenosed.   Prox LAD lesion is 50% stenosed.   Dist LAD lesion is 80% stenosed. 1.  Moderate diffuse coronary artery disease with a high-grade lesion of the distal LAD; of note this is a relatively small vessel disease that does not seem to perfuse the apex. 2.  Fick cardiac output of 13.1 L/min, and Fick cardiac index of 5.9 L/min/m with mean RA pressure of 8 mmHg, mean wedge pressure of 15 mmHg, and mean PA pressure of 23 mmHg. 3.  LVEDP of 20 mmHg. Recommendation: Surgical plan per  cardiothoracic surgery.         Discharge Medications: Allergies as of 01/09/2023   No Known Allergies      Medication List     TAKE these medications    acetaminophen 500 MG tablet Commonly known as: TYLENOL Take 2 tablets (1,000 mg total) by mouth every 6 (six) hours as needed for mild pain or fever.   atorvastatin 40 MG tablet Commonly known as: LIPITOR Take 1 tablet (40 mg total) by mouth daily. What changed: when to take this   blood glucose meter kit and supplies Dispense based on patient and insurance preference. Use one time daily. ICD10: E11.65   fenofibrate 160 MG tablet Take 160 mg by mouth daily.   glipiZIDE 5 MG tablet Commonly known as: GLUCOTROL Take 5 mg by mouth at bedtime.   lamoTRIgine 25 MG tablet Commonly known as: LaMICtal Take 1 pill at bedtime for 2 weeks. Then take 1 pill twice a day for 2 weeks. Then take 1 pill in  AM and 2 pills in PM for one week. Then take 2 pills twice a day for one week. Then take 2 pills in AM and 3 pills in PM for one week. Then take 3 pills twice a day for one week. Then take 3 pills in AM and 4 pills in PM for one week. Then take 4 pills twice a day until pills are gone. Then start 100 mg tablets. What changed:  how much to take how to take this when to take this   lamoTRIgine 100 MG tablet Commonly known as: LaMICtal Take 1 tablet (100 mg total) by mouth 2 (two) times daily. Start taking on: January 27, 2023 What changed: Another medication with the same name was changed. Make sure you understand how and when to take each.   metoprolol tartrate 25 MG tablet Commonly known as: LOPRESSOR Take 0.5 tablets (12.5 mg total) by mouth 2 (two) times daily.   NovoLIN 70/30 Kwikpen (70-30) 100 UNIT/ML KwikPen Generic drug: insulin isophane & regular human KwikPen Inject 18 Units into the skin in the morning and at bedtime.   OneTouch Verio test strip Generic drug: glucose blood 1 each by Other route in the morning and at bedtime. Use as instructed   oxyCODONE 5 MG immediate release tablet Commonly known as: Oxy IR/ROXICODONE Take 1 tablet (5 mg total) by mouth every 4 (four) hours as needed for severe pain.   pioglitazone 15 MG tablet Commonly known as: ACTOS Take 15 mg by mouth in the morning.   promethazine 12.5 MG tablet Commonly known as: PHENERGAN Take 1 tablet (12.5 mg total) by mouth every 6 (six) hours as needed for nausea or vomiting.   VITAMIN D PO Take 2,000 Units by mouth daily.   warfarin 2.5 MG tablet Commonly known as: Coumadin Take 1 tablet (2.5 mg total) by mouth daily.   Zenpep 40000-126000 units Cpep Generic drug: Pancrelipase (Lip-Prot-Amyl) Take 2 capsules by mouth with breakfast, with lunch, and with evening meal.        Follow Up Appointments:  Follow-up Information     Alleen Borne, MD Follow up.   Specialty: Cardiothoracic  Surgery Why: Please keep previously scheduled appointment with Dr. Laneta Simmers February 14 at 12:30 PM.  Obtain a chest x-ray at [facility] 1/2-hour prior to appointment.  It is located in the same office complex on the first floor. Contact information: [address] [employer] [address] [place] [address] [phone]  [facility] 2 day(s).   Specialty: Cardiology Contact information: [address] [id number] [place] [id number] [phone]                Signed: Noel ChristmasWayne E GoldPA-C 01/11/2023, 10:47 AM

## 2023-01-05 NOTE — Progress Notes (Signed)
[address]       [place] [address]             [phone]       EVENING ROUNDS  SP Pericardial drainage for tamponade Doing well

## 2023-01-05 NOTE — Brief Op Note (Signed)
01/05/2023  10:57 AM  PATIENT:  Douglas Phillips  47 y.o. male  PRE-OPERATIVE DIAGNOSIS:  Pericardial effusion  POST-OPERATIVE DIAGNOSIS:  Pericardial Effusion  PROCEDURE:  Procedure(s): SUBXYPHOID PERICARDIAL WINDOW (N/A) TRANSESOPHAGEAL ECHOCARDIOGRAM (N/A)  SURGEON:  Surgeon(s) and Role:    * Kamirah Shugrue, Fernande Boyden, MD - Primary  PHYSICIAN ASSISTANT: none  ASSISTANTS: RNFA   ANESTHESIA:   general  EBL:  minimal  BLOOD ADMINISTERED:one unit  PRBC  DRAINS: (Two 18 F) Blake drain(s) in the pericardium    LOCAL MEDICATIONS USED:  NONE  SPECIMEN:  No Specimen  DISPOSITION OF SPECIMEN:  N/A  COUNTS:  YES  TOURNIQUET:  * No tourniquets in log *  DICTATION: .Dragon Dictation  PLAN OF CARE: Admit to inpatient   PATIENT DISPOSITION:  PACU - hemodynamically stable.   Delay start of Pharmacological VTE agent (>24hrs) due to surgical blood loss or risk of bleeding: yes

## 2023-01-05 NOTE — Anesthesia Procedure Notes (Signed)
Procedure Name: Intubation Date/Time: 01/05/2023 9:57 AM  Performed by: Kyung Rudd, CRNAPre-anesthesia Checklist: Patient identified, Emergency Drugs available, Suction available and Patient being monitored Patient Re-evaluated:Patient Re-evaluated prior to induction Oxygen Delivery Method: Circle system utilized Preoxygenation: Pre-oxygenation with 100% oxygen Induction Type: IV induction and Rapid sequence Laryngoscope Size: Glidescope and 3 Tube type: Oral Tube size: 7.5 mm Number of attempts: 1 Airway Equipment and Method: Stylet Placement Confirmation: ETT inserted through vocal cords under direct vision, positive ETCO2 and breath sounds checked- equal and bilateral Secured at: 22 cm Tube secured with: Tape Dental Injury: Teeth and Oropharynx as per pre-operative assessment  Comments: Grade I view on Glidescope screen.

## 2023-01-05 NOTE — Progress Notes (Signed)
Procedure(s) (LRB): SUBXYPHOID PERICARDIAL WINDOW (N/A) TRANSESOPHAGEAL ECHOCARDIOGRAM (N/A) Subjective:  Hemodynamically stable overnight. Still having some nausea, received phenergan every 6 hr. No vomiting. Has several loose stools. Denies any abdominal pain. Up in chair this am.  Had low grade fever to 100.3. 98.7 now.  Objective: Vital signs in last 24 hours: Temp:  [98.7 F (37.1 C)-100.3 F (37.9 C)] 98.7 F (37.1 C) (01/24 0331) Pulse Rate:  [78-97] 78 (01/24 0600) Cardiac Rhythm: Normal sinus rhythm (01/24 0054) Resp:  [17-30] 25 (01/24 0600) BP: (97-155)/(66-83) 102/80 (01/24 0600) SpO2:  [91 %-98 %] 96 % (01/24 0600) Weight:  [108.9 kg-109 kg] 109 kg (01/24 0550)  Hemodynamic parameters for last 24 hours:    Intake/Output from previous day: 01/23 0701 - 01/24 0700 In: 1147.8 [P.O.:60; I.V.:257.8; Blood:730; IV Piggyback:100] Out: -  Intake/Output this shift: Total I/O In: 1147.8 [P.O.:60; I.V.:257.8; Blood:730; IV Piggyback:100] Out: -   General appearance: alert and cooperative Neurologic: intact Heart: regular rate and rhythm and crisp mechanical valve click Lungs: diminished breath sounds bilaterally Abdomen: soft, non-tender; bowel sounds normal Extremities: edema minimal in ankles Wound: chest incision healing well  Lab Results: Recent Labs    01/04/23 1742  WBC 18.1*  HGB 8.8*  HCT 26.8*  PLT 366   BMET:  Recent Labs    01/04/23 1742  NA 130*  K 4.1  CL 92*  CO2 25  GLUCOSE 300*  BUN 18  CREATININE 1.17  CALCIUM 8.7*    PT/INR:  Recent Labs    01/04/23 1742  LABPROT 32.2*  INR 3.2*   ABG    Component Value Date/Time   PHART 7.334 (L) 12/27/2022 2024   HCO3 22.5 12/27/2022 2024   TCO2 24 12/27/2022 2024   ACIDBASEDEF 3.0 (H) 12/27/2022 2024   O2SAT 99 12/27/2022 2024   CBG (last 3)  Recent Labs    01/04/23 2036 01/05/23 0013 01/05/23 0333  GLUCAP 287* 235* 207*    Assessment/Plan:  Moderate circumferential  pericardial effusion with early signs of tamponade on echo. He has remained hemodynamically stable. Plan subxyphoid pericardial window today when OR available. INR was 3.2 on admission and received 2 units FFP last night. Will repeat this am.  I am not sure if his nausea is all related to pericardial effusion but it is a common symptom. 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 Will follow. His abdomen is benign. Had several loose stools but had not moved his bowels in a few days.  DM: glucose coming down with SSI. Will remove D5 from IVF. He is NPO.  Mechanical valve on coumadin. Repeat INR this am.   LOS: 1 day    Gaye Pollack 01/05/2023

## 2023-01-05 NOTE — Progress Notes (Signed)
Cardiology Office Note:    Date:  01/18/2023   ID:  Douglas Phillips, DOB 19-Nov-1976, MRN [id number]  PCP:  Guadlupe Spanish, MD   [facility] Prov[facility] Cardiologist:  Janina Mayo, MD { Referring MD: Guadlupe Spanish, MD   Chief Complaint  Patient presents with   Follow-up    S/P AVR    History of Present Illness:    YEUDIEL Phillips is a 47 y.o. male with a hx of CAD, thoracic aortic aneurysm, DM, OSA on CPAP, and bicuspid aortic valve. He was referred to cardiology for an elevated coronary calcium score in the LCX and LAD on CT 09/2021 as well as ascending aortic aneurysm. Echocardiogram showed bicuspid aortic valve, preserved EF 60-65%, grade 2 DD, aortic root 49 mm, and severe ascending aorta measuring 49 mm.   Right and left heart catheterization prior to AVR showed moderate diffuse CAD with high-grade lesion in the distal LAD. Vessel was relatively small and did not perfuse the apex.   He underwent replacement of ascending aorta and Bentall procedure using an On-X mechanical aortic valve 12/27/22. Post op course complicated by initially nausea. He was discharged on coumadin. He presented to CT surgery OP clinic for follow up and reported nausea and hypotension. He developed a moderate sized pericardial effusion requiring readmission for subxyphoid pericardial window. He was treated with colchicine for pericarditis.   He presents for follow up, he is here with his wife. He denies SOB and orthopnea. He does have some mild swelling in his feet noted by tight fitting shoes. Chest soreness at a minimum. His activity level is good - just took their son to [place] last weekend. He reports dark urine since surgery - question hematuria, but will repeat labs and UA to make sure this is stable.   Past Medical History:  Diagnosis Date   Diabetes mellitus without complication (Start)    type 2   High cholesterol    Hypertension    Kidney stones    Migraine     MIGRAINE HEADACHE 04/05/2007   Qualifier: Diagnosis of  By: Larose Kells MD, [place] sleep 03/12/2019   Sleep apnea    wears CPAP    Past Surgical History:  Procedure Laterality Date   BACK SURGERY     ruptured L4 and L5- x2   BENTALL PROCEDURE N/A 12/27/2022   Procedure: BENTALL PROCEDURE USING 27/29MM ON-X ASCENDING AORTIC PROSTHESIS AND 28x10MM HEMASHIELD PLATINUM GRAFT;  Surgeon: Gaye Pollack, MD;  Location: [facility];  Service: Open Heart Surgery;  Laterality: N/A;  CIRC ARREST   MOUTH SURGERY     RIGHT/LEFT HEART CATH AND CORONARY ANGIOGRAPHY N/A 12/17/2022   Procedure: RIGHT/LEFT HEART CATH AND CORONARY ANGIOGRAPHY;  Surgeon: Early Osmond, MD;  Location: [facility];  Service: Cardiovascular;  Laterality: N/A;   SUBXYPHOID PERICARDIAL WINDOW N/A 01/05/2023   Procedure: SUBXYPHOID PERICARDIAL WINDOW;  Surgeon: Gaye Pollack, MD;  Location: [facility];  Service: Thoracic;  Laterality: N/A;   TEE WITHOUT CARDIOVERSION N/A 12/27/2022   Procedure: TRANSESOPHAGEAL ECHOCARDIOGRAM (TEE);  Surgeon: Gaye Pollack, MD;  Location: [facility];  Service: Open Heart Surgery;  Laterality: N/A;   TEE WITHOUT CARDIOVERSION N/A 01/05/2023   Procedure: TRANSESOPHAGEAL ECHOCARDIOGRAM;  Surgeon: Gaye Pollack, MD;  Location: [facility];  Service: Thoracic;  Laterality: N/A;   THORACIC AORTIC ANEURYSM REPAIR N/A 12/27/2022   Procedure: THORACIC ASCENDING ANEURYSM REPAIR (AAA);  Surgeon: Gilford Raid  K, MD;  Location: [facility];  Service: Open Heart Surgery;  Laterality: N/A;    Current Medications: Current Meds  Medication Sig   acetaminophen (TYLENOL) 500 MG tablet Take 2 tablets (1,000 mg total) by mouth every 6 (six) hours as needed for mild pain or fever.   atorvastatin (LIPITOR) 40 MG tablet Take 1 tablet (40 mg total) by mouth daily. (Patient taking differently: Take 40 mg by mouth at bedtime.)   blood glucose meter kit and supplies Dispense based on patient and insurance preference. Use one  time daily. ICD10: E11.65   fenofibrate 160 MG tablet Take 160 mg by mouth daily.   glipiZIDE (GLUCOTROL) 5 MG tablet Take 5 mg by mouth at bedtime.   glucose blood (ONETOUCH VERIO) test strip 1 each by Other route in the morning and at bedtime. Use as instructed   metoprolol tartrate (LOPRESSOR) 25 MG tablet Take 0.5 tablets (12.5 mg total) by mouth 2 (two) times daily.   NOVOLIN 70/30 KWIKPEN (70-30) 100 UNIT/ML KwikPen Inject 18 Units into the skin in the morning and at bedtime.   pioglitazone (ACTOS) 15 MG tablet Take 15 mg by mouth in the morning.   VITAMIN D PO Take 2,000 Units by mouth daily.   warfarin (COUMADIN) 2.5 MG tablet Take 1 tablet (2.5 mg total) by mouth daily.   ZENPEP 40000-126000 units CPEP Take 2 capsules by mouth with breakfast, with lunch, and with evening meal.     Allergies:   Patient has no known allergies.   Social History   Socioeconomic History   Marital status: Married    Spouse name: Not on file   Number of children: Not on file   Years of education: Not on file   Highest education level: Not on file  Occupational History   Not on file  Tobacco Use   Smoking status: Never   Smokeless tobacco: Never  Vaping Use   Vaping Use: Never used  Substance and Sexual Activity   Alcohol use: Yes    Comment: occasionally   Drug use: No   Sexual activity: Not on file  Other Topics Concern   Not on file  Social History Narrative   Not on file   Social Determinants of Health   Financial Resource Strain: Not on file  Food Insecurity: No Food Insecurity (01/09/2023)   Hunger Vital Sign    Worried About Running Out of Food in the Last Year: Never true    Ran Out of Food in the Last Year: Never true  Transportation Needs: No Transportation Needs (01/09/2023)   PRAPARE - Administrator, Civil Service (Medical): No    Lack of Transportation (Non-Medical): No  Physical Activity: Not on file  Stress: Not on file  Social Connections: Not on file      Family History: The patient's family history includes Heart attack in his paternal grandfather; Hypertension in his father and paternal grandfather; Liver cancer in his maternal grandmother; Osteoarthritis in his mother. There is no history of Colon cancer or Prostate cancer.  ROS:   Please see the history of present illness.     All other systems reviewed and are negative.  EKGs/Labs/Other Studies Reviewed:    The following studies were reviewed today:  Echo 01/04/23:  1. Left ventricular ejection fraction, by estimation, is 65 to 70%. The  left ventricle has normal function. The left ventricle has no regional  wall motion abnormalities. There is mild concentric left ventricular  hypertrophy. Left  ventricular diastolic  parameters are consistent with Grade II diastolic dysfunction  (pseudonormalization). Elevated left atrial pressure.   2. Right ventricular systolic function is normal. The right ventricular  size is normal. Tricuspid regurgitation signal is inadequate for assessing  PA pressure.   3. Around the RV free wall and apex the pericardial fluid appears to be  denser, maybe with echogenicity of clot. The inferior vena cava appears  plethoric, but there is no diastolic chamber collapse. There is only  mildly exaggerated respiratory flow  variation across the AV valves (mitral flow variation approximately 20%),  but in some images there is subtle respiratory displacement of the  ventricular septum. Finally there is a suggestion of pulsus paradoxus when  evaluating the flow velocity across  the pulmonary and aortic valves. Moderate pericardial effusion. The  pericardial effusion is circumferential.   4. The mitral valve is normal in structure. No evidence of mitral valve  regurgitation.   5. The aortic valve has been repaired/replaced. Aortic valve  regurgitation is not visualized. There is a 27-29 On-X Bentall mechanical  valve present in the aortic position. Procedure  Date: 12/27/2022. Echo  findings are consistent with normal structure  and function of the aortic valve prosthesis.   6. Graft repair of the ascending aorta appears intact. Aortic  root/ascending aorta has been repaired/replaced.   7. The inferior vena cava is dilated in size with <50% respiratory  variability, suggesting right atrial pressure of 15 mmHg.    [facility] 12/17/22:   Dist RCA-1 lesion is 40% stenosed.   Dist RCA-2 lesion is 30% stenosed.   Prox LAD lesion is 50% stenosed.   Dist LAD lesion is 80% stenosed.   1.  Moderate diffuse coronary artery disease with a high-grade lesion of the distal LAD; of note this is a relatively small vessel disease that does not seem to perfuse the apex. 2.  Fick cardiac output of 13.1 L/min, and Fick cardiac index of 5.9 L/min/m with mean RA pressure of 8 mmHg, mean wedge pressure of 15 mmHg, and mean PA pressure of 23 mmHg. 3.  LVEDP of 20 mmHg.   Recommendation: Surgical plan per cardiothoracic surgery.   Echo 09/2022:  1. Left ventricular ejection fraction, by estimation, is 60 to 65%. The  left ventricle has normal function. The left ventricle has no regional  wall motion abnormalities. Left ventricular diastolic parameters are  consistent with Grade II diastolic  dysfunction (pseudonormalization). Elevated left ventricular end-diastolic  pressure.   2. Right ventricular systolic function is normal. The right ventricular  size is normal.   3. The mitral valve is normal in structure. No evidence of mitral valve  regurgitation. No evidence of mitral stenosis.   4. Cannot r/o bucuspid valve with fused right and left cusps Overall poor  quality sutdy especially color flow . The aortic valve is abnormal. Aortic  valve regurgitation is moderate. No aortic stenosis is present.   5. Aortic dilatation noted. There is severe dilatation of the aortic  root, measuring 49 mm. There is severe dilatation of the ascending aorta,  measuring 49 mm.    6. The inferior vena cava is normal in size with greater than 50%  respiratory variability, suggesting right atrial pressure of 3 mmHg.   EKG:  EKG is not ordered today.    Recent Labs: 12/23/2022: ALT 40 12/28/2022: Magnesium 2.1 01/09/2023: BUN 12; Creatinine, Ser 0.84; Hemoglobin 9.1; Platelets 405; Potassium 3.6; Sodium 136  Recent Lipid Panel    Component  Value Date/Time   CHOL 112 06/04/2021 0807   TRIG 60.0 06/04/2021 0807   HDL 44.10 06/04/2021 0807   CHOLHDL 3 06/04/2021 0807   VLDL 12.0 06/04/2021 0807   LDLCALC 56 06/04/2021 0807   LDLDIRECT 100.0 03/06/2021 1052     Risk Assessment/Calculations:                Physical Exam:    VS:  BP 122/64   Pulse 76   Ht 5\' 7"  (1.702 m)   Wt 234 lb (106.1 kg)   SpO2 98%   BMI 36.65 kg/m     Wt Readings from Last 3 Encounters:  01/18/23 234 lb (106.1 kg)  01/09/23 237 lb 14 oz (107.9 kg)  01/01/23 240 lb 3.2 oz (109 kg)     GEN:  Well nourished, well developed in no acute distress HEENT: Normal NECK: No JVD; No carotid bruits LYMPHATICS: No lymphadenopathy CARDIAC: RRR, crisp valve click, sternotomy incision C/D/I  RESPIRATORY:  Clear to auscultation without rales, wheezing or rhonchi  ABDOMEN: Soft, non-tender, non-distended MUSCULOSKELETAL:  No edema; No deformity  SKIN: Warm and dry NEUROLOGIC:  Alert and oriented x 3 PSYCHIATRIC:  Normal affect   ASSESSMENT:    1. S/P AVR (aortic valve replacement)   2. Pericardial effusion   3. Long term (current) use of anticoagulants   4. Coronary artery disease involving native heart without angina pectoris, unspecified vessel or lesion type   5. Hyperlipidemia with target LDL less than 70   6. Type 2 diabetes mellitus with hyperglycemia, with long-term current use of insulin (HCC)   7. OSA on CPAP   8. Dark urine    PLAN:    In order of problems listed above:  CAD Hyperlipidemia with LDL goal < 55 Nonobstructive by heart cath Risk factor modification -  distal LAD disease - small vessel and did not perfuse the apex Has not had a lipid panel - will collect this along with LP(a) today - would strive for lower LDL goal given known disease and DM No chest pain Continue 40 mg lipitor and fenofibrate   Pericardial effusion requiring window Repeat echo already scheduled   Bicuspid aortic valve S/P On-X mechanical AVR and ascending aorta repair On coumadin Echo in 6 weeks per protocol 08/19/01 Crisp click on exam   DM Could consider SGLT2i given CAD - will hold off on this given reports of dark urine   OSA on CPAP Compliant    Dark urine Reports dark urine since surgery - will check a UA to rule out UTI No increased frequency or urgency reported May be a component of hematuria with coumadin on board   Follow up I'm 3 months.    Cardiac Rehabilitation Eligibility Assessment  The patient is ready to start cardiac rehabilitation pending clearance from the cardiac surgeon.          Medication Adjustments/Labs and Tests Ordered: Current medicines are reviewed at length with the patient today.  Concerns regarding medicines are outlined above.  Orders Placed This Encounter  Procedures   Lipoprotein A (LPA)   UA/M w/rflx Culture, Routine   Lipid panel   CBC   Basic metabolic panel   No orders of the defined types were placed in this encounter.   Patient Instructions  Medication Instructions:  Your physician recommends that you continue on your current medications as directed. Please refer to the Current Medication list given to you today.  *If you need a refill on your cardiac  medications before your next appointment, please call your pharmacy*   Lab Work: Your physician recommends that you complete labs today. UA with culture, CBC, BMET. Fasting Lipid, Lpa   If you have labs (blood work) drawn today and your tests are completely normal, you will receive your results only by: MyChart Message (if you have MyChart)  OR A paper copy in the mail If you have any lab test that is abnormal or we need to change your treatment, we will call you to review the results.   Testing/Procedures: NONE ordered at this time of appointment     Follow-Up: At [facility], you and your health needs are our priority.  As part of our continuing mission to provide you with exceptional heart care, we have created designated Provider Care Teams.  These Care Teams include your primary Cardiologist (physician) and Advanced Practice Providers (APPs -  Physician Assistants and Nurse Practitioners) who all work together to provide you with the care you need, when you need it.  We recommend signing up for the patient portal called "MyChart".  Sign up information is provided on this After Visit Summary.  MyChart is used to connect with patients for Virtual Visits (Telemedicine).  Patients are able to view lab/test results, encounter notes, upcoming appointments, etc.  Non-urgent messages can be sent to your provider as well.   To learn more about what you can do with MyChart, go to [email or web address].    Your next appointment:   3 month(s)  Provider:   Maisie Fus, MD     Other Instructions     Signed, Marcelino Duster, [place]  01/18/2023 9:01 AM    [facility]

## 2023-01-05 NOTE — Transfer of Care (Signed)
Immediate Anesthesia Transfer of Care Note  Patient: Douglas Phillips  Procedure(s) Performed: SUBXYPHOID PERICARDIAL WINDOW (Chest) TRANSESOPHAGEAL ECHOCARDIOGRAM (Mouth)  Patient Location: PACU  Anesthesia Type:General  Level of Consciousness: drowsy  Airway & Oxygen Therapy: Patient Spontanous Breathing and Patient connected to nasal cannula oxygen  Post-op Assessment: Report given to RN and Post -op Vital signs reviewed and stable  Post vital signs: Reviewed and stable  Last Vitals:  Vitals Value Taken Time  BP 133/68 01/05/23 1108  Temp    Pulse 71 01/05/23 1113  Resp 22 01/05/23 1113  SpO2 91 % 01/05/23 1113  Vitals shown include unvalidated device data.  Last Pain:  Vitals:   01/05/23 0839  TempSrc: Oral  PainSc:          Complications: No notable events documented.

## 2023-01-05 NOTE — Op Note (Signed)
CARDIOVASCULAR SURGERY OPERATIVE NOTE  01/05/2023  Surgeon:  Gaye Pollack, MD  First Assistant: RNFA   Preoperative Diagnosis: Moderate-sized pericardial effusion  Postoperative Diagnosis: Same  Procedure:  Subxyphoid pericardial window   Anesthesia:  General Endotracheal   Clinical History/Surgical Indication:  The patient is a 47 year old gentleman who underwent replacement of ascending aorta under deep hypothermic circulatory arrest using a 28 x 10 mm Hemashield Platinum graft and Bentall procedure using a 27/29 mm On-X mechanical valved graft on 12/27/2022. He had marked nausea postop that resolved after a day and was likely related to anesthesia and diabetic gastroparesis. He had an otherwise unremarkable postop course and was discharged on 01/01/23 on Coumadin 2.5 mg daily. His last INR was 2.3 on the day of discharge and he has not had it checked this week yet. According to his wife he developed nausea and vomiting about 3 am today and he was seen in our office today for chest tube suture removal. He was very nauseous all day. In the office BP was 97/68 which is low for him since he is usually hypertensive. His BP on the morning of discharge was 154/74. He has also developed left shoulder pain. I was concerned about the possibility of a pericardial effusion and had him return for an echo which I discussed with Dr. Sallyanne Kuster and shows a moderate sized pericardial effusion that is circumferential and may have a blood component. There may be early signs of tamponade. Prosthetic aortic valve is functioning normally and LVEF normal. RV function normal. He has developed a moderate pericardial effusion with early signs of tamponade and I suspect his nausea is related to that. He has no SOB but generally feels weak. 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 This will require drainage in the OR. Will check his labs tonight and make sure INR not too high. As long as he remains stable  I will plan to do this in the am. I discussed the operative procedure with the patient and his wife including alternatives, benefits and risks; including but not limited to bleeding, blood transfusion, infection.  Myrtha Mantis understands and agrees to proceed.       Preparation:  The patient was seen in the preoperative holding area and the correct patient, correct operation were confirmed with the patient after reviewing the medical record and catheterization. The consent was signed by me. Preoperative antibiotics were given.  The patient was taken back to the operating room and positioned supine on the operating room table. After being placed under general endotracheal anesthesia by the anesthesia team a foley catheter was placed. The neck, chest, and abdomen were prepped with betadine soap and solution and draped in the usual sterile manner. A surgical time-out was taken and the correct patient and operative procedure were confirmed with the nursing and anesthesia staff.   TEE:  Performed by Dr. Dr. Oren Bracket.  This showed a moderate sized circumferential pericardial effusion.  Subxyphoid pericardial window:  A 4 cm incision was made over the xyphoid process and carried down through the subcutaneous tissue using cautery until the midline fascia was encountered.  The previously placed sutures were removed and the fascia was divided in the midline and the subxyphoid space entered. The pericardial space was entered and about 400 cc of old bloody fluid was removed. TEE confirmed that all of the fluid was removed. A 32 F Blake drain was placed in the inferior pericardial space through a separate small incision.  A second 24 [place] Blake drain was placed through another stab incision and positioned anteriorly over the right ventricle.  Hemostasis was complete. The midline fascia was approximated with interrupted #0 vicryl sutures. The subcutaneous tissue was approximated with continuous 2-0  vicryl suture and the skin with 3-0 vicryl subcuticular suture. The sponge, needle, and instrument counts were correct according to the nurses. Dermabond was applied to the incision. The patient was awakened, extubated, and transported to the PACU in stable condition.

## 2023-01-05 NOTE — Anesthesia Procedure Notes (Signed)
Central Venous Catheter Insertion Performed by: Roderic Palau, MD, anesthesiologist Start/End1/24/2024 9:15 AM, 01/05/2023 9:30 AM Patient location: Pre-op. Preanesthetic checklist: patient identified, IV checked, site marked, risks and benefits discussed, surgical consent, monitors and equipment checked, pre-op evaluation, timeout performed and anesthesia consent Position: Trendelenburg Lidocaine 1% used for infiltration and patient sedated Hand hygiene performed , maximum sterile barriers used  and Seldinger technique used Catheter size: 8 Fr Total catheter length 16. Central line was placed.Double lumen Procedure performed using ultrasound guided technique. Ultrasound Notes:anatomy identified, needle tip was noted to be adjacent to the nerve/plexus identified, no ultrasound evidence of intravascular and/or intraneural injection and image(s) printed for medical record Attempts: 1 Following insertion, dressing applied, line sutured and Biopatch. Post procedure assessment: blood return through all ports  Patient tolerated the procedure well with no immediate complications.

## 2023-01-05 NOTE — Inpatient Diabetes Management (Signed)
Inpatient Diabetes Program Recommendations  AACE/ADA: New Consensus Statement on Inpatient Glycemic Control (2015)  Target Ranges:  Prepandial:   less than 140 mg/dL      Peak postprandial:   less than 180 mg/dL (1-2 hours)      Critically ill patients:  140 - 180 mg/dL    Latest Reference Range & Units 01/04/23 20:36 01/05/23 00:13 01/05/23 03:33 01/05/23 08:24 01/05/23 11:10  Glucose-Capillary 70 - 99 mg/dL 287 (H)  8 units Novolog  235 (H)  5 units Novolog  207 (H)  5 units Novolog  167 (H)    10 mg Decadron @1012  208 (H)  (H): Data is abnormally high       Home DM Meds: Actos 15 mg daily    Glipizide 5 mg QHS     70/30 Insulin 18 units BID   Current: Novolog 0-24 Q4H (started at 9pm last night).      NPO this AM for Subxyphoid pericardial window today    MD- Note Novolog SSI started.  Pt was given Decadron X 1 dose at 10am.  Of note, pt was getting 25-35 units Levemir daily when he was here from 01/16 thru 01/20.  Please consider starting Levemir 20 units Daily today (0.2 units/kg)    --Will follow patient during hospitalization--  Wyn Quaker RN, MSN, [place] Diabetes Coordinator Inpatient Glycemic Control Team Team Pager: [phone] (8a-5p)

## 2023-01-05 NOTE — Hospital Course (Addendum)
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 He had marked nausea postop that resolved after a day and was likely related to anesthesia and diabetic gastroparesis. He had an otherwise unremarkable postop course and was discharged on 01/01/23 on Coumadin 2.5 mg daily. His last INR was 2.3 on the day of discharge and he has not had it checked this week yet. According to his wife he developed nausea and vomiting about 3 am today and he was seen in our office today for chest tube suture removal. He was very nauseous all day. In the office BP was 97/68 which is low for him since he is usually hypertensive. His BP on the morning of discharge was 154/74. He has also developed left shoulder pain. I was concerned about the possibility of a pericardial effusion and had him return for an echo which I discussed with Dr. Sallyanne Kuster and shows a moderate sized pericardial effusion that is circumferential and may have a blood component. There may be early signs of tamponade. Prosthetic aortic valve is functioning normally and LVEF normal. RV function normal. CXR shows large cardiac shadow and Left base atelectasis, possible pleural effusion.  He was felt to require admission for pericardial window.   Hospital course:  The patient was taken the operating room 01/05/2023 and subsequently underwent  pericardial window.  He tolerated procedure well was taken to the postanesthesia care unit in stable condition.    Postoperative hospital course:  He has remained hemodynamically stable.  He does have some sinus bradycardia and Lopressor is being held.  Chest tube had moderate drainage and is being kept in place.  INR on postop day 1 was 2.8 and Coumadin is currently on hold.  The patient continues to have a poor  appetite.  He is started on a course of routine pulmonary hygiene and ambulation protocols.  Art line was removed on postop day #1.  On postoperative day#2 one of the chest tubes was removed.  The second tube was removed the following day.  He has been started on colchicine on for pericarditis component of the pericardial effusion.  Coumadin has been resumed for his mechanical On-X aortic valve.  INRs are being checked daily.  Sugars have been under adequate control.

## 2023-01-05 NOTE — Anesthesia Preprocedure Evaluation (Addendum)
Anesthesia Evaluation  Patient identified by MRN, date of birth, ID band Patient awake    Reviewed: Allergy & Precautions, H&P , NPO status , Patient's Chart, lab work & pertinent test results, reviewed documented beta blocker date and time   Airway Mallampati: III  TM Distance: >3 FB Neck ROM: Full    Dental no notable dental hx. (+) Teeth Intact, Dental Advisory Given   Pulmonary sleep apnea    Pulmonary exam normal breath sounds clear to auscultation       Cardiovascular hypertension, Pt. on medications and Pt. on home beta blockers  Rhythm:Regular Rate:Normal     Neuro/Psych  Headaches  negative psych ROS   GI/Hepatic negative GI ROS, Neg liver ROS,,,  Endo/Other  diabetes, Insulin Dependent, Oral Hypoglycemic Agents  Morbid obesity  Renal/GU negative Renal ROS  negative genitourinary   Musculoskeletal   Abdominal   Peds  Hematology negative hematology ROS (+)   Anesthesia Other Findings   Reproductive/Obstetrics negative OB ROS                             Anesthesia Physical Anesthesia Plan  ASA: 3  Anesthesia Plan: General   Post-op Pain Management: Ofirmev IV (intra-op)*   Induction: Intravenous  PONV Risk Score and Plan: 3 and Ondansetron, Dexamethasone and Midazolam  Airway Management Planned: Oral ETT  Additional Equipment: Arterial line, CVP and Ultrasound Guidance Line Placement  Intra-op Plan:   Post-operative Plan: Extubation in OR  Informed Consent: I have reviewed the patients History and Physical, chart, labs and discussed the procedure including the risks, benefits and alternatives for the proposed anesthesia with the patient or authorized representative who has indicated his/her understanding and acceptance.     Dental advisory given  Plan Discussed with: CRNA  Anesthesia Plan Comments: (TEE for monitoring purposes only)       Anesthesia Quick  Evaluation

## 2023-01-06 ENCOUNTER — Encounter (HOSPITAL_COMMUNITY): Payer: Self-pay | Admitting: Surgery

## 2023-01-06 ENCOUNTER — Inpatient Hospital Stay (HOSPITAL_COMMUNITY): Payer: BC Managed Care – PPO

## 2023-01-06 ENCOUNTER — Other Ambulatory Visit (HOSPITAL_COMMUNITY): Payer: Self-pay

## 2023-01-06 LAB — GLUCOSE, CAPILLARY
Glucose-Capillary: 167 mg/dL — ABNORMAL HIGH (ref 70–99)
Glucose-Capillary: 194 mg/dL — ABNORMAL HIGH (ref 70–99)
Glucose-Capillary: 199 mg/dL — ABNORMAL HIGH (ref 70–99)
Glucose-Capillary: 210 mg/dL — ABNORMAL HIGH (ref 70–99)
Glucose-Capillary: 216 mg/dL — ABNORMAL HIGH (ref 70–99)
Glucose-Capillary: 222 mg/dL — ABNORMAL HIGH (ref 70–99)
Glucose-Capillary: 233 mg/dL — ABNORMAL HIGH (ref 70–99)

## 2023-01-06 LAB — CBC
HCT: 24.8 % — ABNORMAL LOW (ref 39.0–52.0)
Hemoglobin: 8.3 g/dL — ABNORMAL LOW (ref 13.0–17.0)
MCH: 30.1 pg (ref 26.0–34.0)
MCHC: 33.5 g/dL (ref 30.0–36.0)
MCV: 89.9 fL (ref 80.0–100.0)
Platelets: 350 10*3/uL (ref 150–400)
RBC: 2.76 MIL/uL — ABNORMAL LOW (ref 4.22–5.81)
RDW: 12.8 % (ref 11.5–15.5)
WBC: 15.8 10*3/uL — ABNORMAL HIGH (ref 4.0–10.5)
nRBC: 0 % (ref 0.0–0.2)

## 2023-01-06 LAB — BASIC METABOLIC PANEL
Anion gap: 9 (ref 5–15)
BUN: 22 mg/dL — ABNORMAL HIGH (ref 6–20)
CO2: 26 mmol/L (ref 22–32)
Calcium: 8.3 mg/dL — ABNORMAL LOW (ref 8.9–10.3)
Chloride: 96 mmol/L — ABNORMAL LOW (ref 98–111)
Creatinine, Ser: 0.82 mg/dL (ref 0.61–1.24)
GFR, Estimated: >60 mL/min (ref >60)
Glucose, Bld: 202 mg/dL — ABNORMAL HIGH (ref 70–99)
Potassium: 3.9 mmol/L (ref 3.5–5.1)
Sodium: 131 mmol/L — ABNORMAL LOW (ref 135–145)

## 2023-01-06 LAB — PROTIME-INR
INR: 2.8 — ABNORMAL HIGH (ref 0.8–1.2)
Prothrombin Time: 29.4 seconds — ABNORMAL HIGH (ref 11.4–15.2)

## 2023-01-06 MED ORDER — FE FUM-VIT C-VIT B12-FA 460-60-0.01-1 MG PO CAPS
1.0000 | ORAL_CAPSULE | Freq: Every day | ORAL | Status: DC
  Administered 2023-01-06 – 2023-01-09 (×4): 1 via ORAL
  Filled 2023-01-06 (×4): qty 1

## 2023-01-06 MED ORDER — INSULIN ASPART 100 UNIT/ML IJ SOLN
0.0000 [IU] | Freq: Three times a day (TID) | INTRAMUSCULAR | Status: DC
  Administered 2023-01-06: 8 [IU] via SUBCUTANEOUS
  Administered 2023-01-06 – 2023-01-07 (×2): 4 [IU] via SUBCUTANEOUS
  Administered 2023-01-07: 2 [IU] via SUBCUTANEOUS
  Administered 2023-01-07 – 2023-01-08 (×2): 4 [IU] via SUBCUTANEOUS
  Administered 2023-01-08 (×2): 2 [IU] via SUBCUTANEOUS
  Administered 2023-01-09 (×2): 8 [IU] via SUBCUTANEOUS

## 2023-01-06 MED ORDER — INSULIN DETEMIR 100 UNIT/ML ~~LOC~~ SOLN
30.0000 [IU] | Freq: Every day | SUBCUTANEOUS | Status: DC
  Administered 2023-01-06 – 2023-01-09 (×4): 30 [IU] via SUBCUTANEOUS
  Filled 2023-01-06 (×4): qty 0.3

## 2023-01-06 MED ORDER — COLCHICINE 0.6 MG PO TABS
0.6000 mg | ORAL_TABLET | Freq: Every day | ORAL | Status: DC
  Administered 2023-01-06 – 2023-01-09 (×4): 0.6 mg via ORAL
  Filled 2023-01-06 (×4): qty 1

## 2023-01-06 MED ORDER — FUROSEMIDE 10 MG/ML IJ SOLN
40.0000 mg | Freq: Once | INTRAMUSCULAR | Status: AC
  Administered 2023-01-06: 40 mg via INTRAVENOUS
  Filled 2023-01-06: qty 4

## 2023-01-06 MED ORDER — POTASSIUM CHLORIDE 10 MEQ/50ML IV SOLN
10.0000 meq | INTRAVENOUS | Status: AC
  Administered 2023-01-06 (×3): 10 meq via INTRAVENOUS
  Filled 2023-01-06: qty 50

## 2023-01-06 NOTE — TOC Benefit Eligibility Note (Signed)
Patient Teacher, English as a foreign language completed.    The patient is currently admitted and upon discharge could be taking colchicine 0.6 mg tablets.  The current 30 day co-pay is $0.00.   The patient is insured through [employer] [place] [employer] Patient Advocate Team Direct Number: [phone]  Fax: [phone]

## 2023-01-06 NOTE — Progress Notes (Signed)
Patient ID: Douglas Phillips, male   DOB: 1976-03-30, 47 y.o.   MRN: [id number]  TCTS Evening Rounds:  Hemodynamically stable in sinus rhythm.  Feels well today and ambulated twice.  Diuresing well.  Pericardial tube output low.

## 2023-01-06 NOTE — Progress Notes (Signed)
1 Day Post-Op Procedure(s) (LRB): SUBXYPHOID PERICARDIAL WINDOW (N/A) TRANSESOPHAGEAL ECHOCARDIOGRAM (N/A) Subjective: Feels much better. Nausea resolved. Smiling this morning. Able to eat.  Objective: Vital signs in last 24 hours: Temp:  [97.7 F (36.5 C)-98.9 F (37.2 C)] 97.7 F (36.5 C) (01/25 0358) Pulse Rate:  [50-81] 60 (01/25 0703) Cardiac Rhythm: Sinus bradycardia (01/25 0000) Resp:  [11-31] 18 (01/25 0703) BP: (96-150)/(50-77) 132/65 (01/25 0700) SpO2:  [2 %-100 %] 95 % (01/25 0703) Arterial Line BP: (176-194)/(53-63) 176/62 (01/24 1138) Weight:  [109 kg-109.2 kg] 109.2 kg (01/25 0600)  Hemodynamic parameters for last 24 hours:    Intake/Output from previous day: 01/24 0701 - 01/25 0700 In: 2230.3 [P.O.:120; I.V.:1695.3; Blood:315; IV Piggyback:100] Out: 1570 [Urine:900; Drains:220] Intake/Output this shift: No intake/output data recorded.  General appearance: alert and cooperative Neurologic: intact Heart: regular rate and rhythm and crisp mechanical valve click Lungs: clear to auscultation bilaterally Abdomen: soft, non-tender; bowel sounds normal; no masses,  no organomegaly Extremities: edema mild Wound: chest incision healing well.  Lab Results: Recent Labs    01/05/23 1130 01/06/23 0404  WBC 14.0* 15.8*  HGB 8.3* 8.3*  HCT 25.4* 24.8*  PLT 314 350   BMET:  Recent Labs    01/05/23 0742 01/06/23 0404  NA 133* 131*  K 3.7 3.9  CL 96* 96*  CO2 27 26  GLUCOSE 171* 202*  BUN 16 22*  CREATININE 0.97 0.82  CALCIUM 8.6* 8.3*    PT/INR:  Recent Labs    01/06/23 0404  LABPROT 29.4*  INR 2.8*   ABG    Component Value Date/Time   PHART 7.334 (L) 12/27/2022 2024   HCO3 22.5 12/27/2022 2024   TCO2 24 12/27/2022 2024   ACIDBASEDEF 3.0 (H) 12/27/2022 2024   O2SAT 99 12/27/2022 2024   CBG (last 3)  Recent Labs    01/05/23 2042 01/06/23 0005 01/06/23 0400  GLUCAP 286* 233* 216*   CXR: left base atelectasis, may be some left pleural  effusion.  Assessment/Plan: S/P Procedure(s) (LRB): SUBXYPHOID PERICARDIAL WINDOW (N/A) TRANSESOPHAGEAL ECHOCARDIOGRAM (N/A)  POD 1 Hemodynamically stable, sinus brady 56. Hold off on Lopressor.  Keep chest tubes in today.  DM: glucose 200's. Start levemir and continue SSI.  DC arterial line.  IS, ambulation.  INR up to 2.8 today with no Coumadin for a few days. Likely due to not eating. Will hold off on Coumadin for now and check tomorrow.   LOS: 2 days    Douglas Phillips 01/06/2023

## 2023-01-07 LAB — GLUCOSE, CAPILLARY
Glucose-Capillary: 180 mg/dL — ABNORMAL HIGH (ref 70–99)
Glucose-Capillary: 167 mg/dL — ABNORMAL HIGH (ref 70–99)
Glucose-Capillary: 145 mg/dL — ABNORMAL HIGH (ref 70–99)
Glucose-Capillary: 198 mg/dL — ABNORMAL HIGH (ref 70–99)
Glucose-Capillary: 191 mg/dL — ABNORMAL HIGH (ref 70–99)

## 2023-01-07 LAB — PROTIME-INR
INR: 2.5 — ABNORMAL HIGH (ref 0.8–1.2)
Prothrombin Time: 27 seconds — ABNORMAL HIGH (ref 11.4–15.2)

## 2023-01-07 MED ORDER — ORAL CARE MOUTH RINSE: 15.0000 mL | OROMUCOSAL | Status: DC | PRN

## 2023-01-07 MED ORDER — WARFARIN SODIUM 2 MG PO TABS
2.0000 mg | ORAL_TABLET | Freq: Every day | ORAL | Status: AC
  Administered 2023-01-07: 2 mg via ORAL
  Filled 2023-01-07: qty 1

## 2023-01-07 MED ORDER — WARFARIN - PHYSICIAN DOSING INPATIENT: Freq: Every day | Status: DC

## 2023-01-07 NOTE — Progress Notes (Signed)
2 Days Post-Op Procedure(s) (LRB): SUBXYPHOID PERICARDIAL WINDOW (N/A) TRANSESOPHAGEAL ECHOCARDIOGRAM (N/A) Subjective:  No complaints. Feels well. Ambulating well.  Objective: Vital signs in last 24 hours: Temp:  [97.9 F (36.6 C)-99.3 F (37.4 C)] 98.6 F (37 C) (01/26 0430) Pulse Rate:  [58-78] 60 (01/26 0600) Cardiac Rhythm: Normal sinus rhythm (01/25 2152) Resp:  [14-25] 21 (01/26 0703) BP: (91-139)/(54-72) 110/64 (01/26 0703) SpO2:  [93 %-98 %] 95 % (01/26 0600) Weight:  [108.4 kg] 108.4 kg (01/26 0630)  Hemodynamic parameters for last 24 hours:    Intake/Output from previous day: 01/25 0701 - 01/26 0700 In: 169.9 [P.O.:120; I.V.:49.9] Out: 1310 [Urine:1200; Drains:110] Intake/Output this shift: No intake/output data recorded.  General appearance: alert and cooperative Neurologic: intact Heart: regular rate and rhythm and crisp mechanical valve click Lungs: clear to auscultation bilaterally Extremities: no edema Wound: incision ok  Lab Results: Recent Labs    01/05/23 1130 01/06/23 0404  WBC 14.0* 15.8*  HGB 8.3* 8.3*  HCT 25.4* 24.8*  PLT 314 350   BMET:  Recent Labs    01/05/23 0742 01/06/23 0404  NA 133* 131*  K 3.7 3.9  CL 96* 96*  CO2 27 26  GLUCOSE 171* 202*  BUN 16 22*  CREATININE 0.97 0.82  CALCIUM 8.6* 8.3*    PT/INR:  Recent Labs    01/07/23 0430  LABPROT 27.0*  INR 2.5*   ABG    Component Value Date/Time   PHART 7.334 (L) 12/27/2022 2024   HCO3 22.5 12/27/2022 2024   TCO2 24 12/27/2022 2024   ACIDBASEDEF 3.0 (H) 12/27/2022 2024   O2SAT 99 12/27/2022 2024   CBG (last 3)  Recent Labs    01/06/23 2335 01/07/23 0402 01/07/23 0640  GLUCAP 210* 180* 167*    Assessment/Plan: S/P Procedure(s) (LRB): SUBXYPHOID PERICARDIAL WINDOW (N/A) TRANSESOPHAGEAL ECHOCARDIOGRAM (N/A)  POD 3 Hemodynamically stable in sinus rhythm.  Pericardial tube output low. Will remove the left tube which is substernal over RV. Can remove the  other tomorrow.  Continue Colchicine for pericarditis.  DC central line.  DM: glucose under adequate control.  INR stable at 2.5. Will resume Coumadin 2 mg daily. Will probably need 2-2.5 mg.  Anticipate home Sunday if the other tube comes out tomorrow. Will need close followup of INR next week as outpatient.  LOS: 3 days    Douglas Phillips 01/07/2023

## 2023-01-07 NOTE — Progress Notes (Signed)
EVENING ROUNDS NOTE :     [address]       [place] [address]             [phone]                 2 Days Post-Op Procedure(s) (LRB): SUBXYPHOID PERICARDIAL WINDOW (N/A) TRANSESOPHAGEAL ECHOCARDIOGRAM (N/A)   Total Length of Stay:  LOS: 3 days  Events:   No events Low CT output   BP 111/69   Pulse 63   Temp 97.8 F (36.6 C) (Oral)   Resp 18   Ht 5\' 7"  (1.702 m)   Wt 108.4 kg   SpO2 96%   BMI 37.43 kg/m          promethazine (PHENERGAN) injection (IM or IVPB) Stopped (01/05/23 0112)    I/O last 3 completed shifts: In: 915.1 [P.O.:240; I.V.:675.1] Out: 2055 [Urine:1815; Drains:240]      Latest Ref Rng & Units 01/06/2023    4:04 AM 01/05/2023   11:30 AM 01/05/2023    7:42 AM  CBC  WBC 4.0 - 10.5 K/uL 15.8  14.0  15.3   Hemoglobin 13.0 - 17.0 g/dL 8.3  8.3  7.8   Hematocrit 39.0 - 52.0 % 24.8  25.4  23.2   Platelets 150 - 400 K/uL 350  314  345        Latest Ref Rng & Units 01/06/2023    4:04 AM 01/05/2023    7:42 AM 01/04/2023    5:42 PM  BMP  Glucose 70 - 99 mg/dL 202  171  300   BUN 6 - 20 mg/dL 22  16  18    Creatinine 0.61 - 1.24 mg/dL 0.82  0.97  1.17   Sodium 135 - 145 mmol/L 131  133  130   Potassium 3.5 - 5.1 mmol/L 3.9  3.7  4.1   Chloride 98 - 111 mmol/L 96  96  92   CO2 22 - 32 mmol/L 26  27  25    Calcium 8.9 - 10.3 mg/dL 8.3  8.6  8.7     ABG    Component Value Date/Time   PHART 7.334 (L) 12/27/2022 2024   PCO2ART 42.2 12/27/2022 2024   PO2ART 137 (H) 12/27/2022 2024   HCO3 22.5 12/27/2022 2024   TCO2 24 12/27/2022 2024   ACIDBASEDEF 3.0 (H) 12/27/2022 2024   O2SAT 99 12/27/2022 2024       Melodie Bouillon, MD 01/07/2023 6:08 PM

## 2023-01-07 NOTE — Discharge Instructions (Signed)

## 2023-01-08 LAB — CBC
HCT: 28.7 % — ABNORMAL LOW (ref 39.0–52.0)
Hemoglobin: 10 g/dL — ABNORMAL LOW (ref 13.0–17.0)
MCH: 30.5 pg (ref 26.0–34.0)
MCHC: 34.8 g/dL (ref 30.0–36.0)
MCV: 87.5 fL (ref 80.0–100.0)
Platelets: 451 10*3/uL — ABNORMAL HIGH (ref 150–400)
RBC: 3.28 MIL/uL — ABNORMAL LOW (ref 4.22–5.81)
RDW: 12.6 % (ref 11.5–15.5)
WBC: 12.2 10*3/uL — ABNORMAL HIGH (ref 4.0–10.5)
nRBC: 0.2 % (ref 0.0–0.2)

## 2023-01-08 LAB — BPAM RBC
Blood Product Expiration Date: 202402162359
Blood Product Expiration Date: 202402162359
ISSUE DATE / TIME: 202401240904
ISSUE DATE / TIME: 202401240904
Unit Type and Rh: 6200
Unit Type and Rh: 6200

## 2023-01-08 LAB — TYPE AND SCREEN
ABO/RH(D): A POS
Antibody Screen: NEGATIVE
Unit division: 0
Unit division: 0

## 2023-01-08 LAB — BASIC METABOLIC PANEL
Anion gap: 11 (ref 5–15)
BUN: 14 mg/dL (ref 6–20)
CO2: 27 mmol/L (ref 22–32)
Calcium: 9 mg/dL (ref 8.9–10.3)
Chloride: 98 mmol/L (ref 98–111)
Creatinine, Ser: 0.86 mg/dL (ref 0.61–1.24)
GFR, Estimated: >60 mL/min (ref >60)
Glucose, Bld: 141 mg/dL — ABNORMAL HIGH (ref 70–99)
Potassium: 3.9 mmol/L (ref 3.5–5.1)
Sodium: 136 mmol/L (ref 135–145)

## 2023-01-08 LAB — PROTIME-INR
INR: 1.9 — ABNORMAL HIGH (ref 0.8–1.2)
Prothrombin Time: 21.2 seconds — ABNORMAL HIGH (ref 11.4–15.2)

## 2023-01-08 LAB — GLUCOSE, CAPILLARY
Glucose-Capillary: 140 mg/dL — ABNORMAL HIGH (ref 70–99)
Glucose-Capillary: 177 mg/dL — ABNORMAL HIGH (ref 70–99)
Glucose-Capillary: 155 mg/dL — ABNORMAL HIGH (ref 70–99)
Glucose-Capillary: 313 mg/dL — ABNORMAL HIGH (ref 70–99)

## 2023-01-08 MED ORDER — ATORVASTATIN CALCIUM 40 MG PO TABS
40.0000 mg | ORAL_TABLET | Freq: Every day | ORAL | Status: DC
  Administered 2023-01-08 – 2023-01-09 (×2): 40 mg via ORAL
  Filled 2023-01-08 (×2): qty 1

## 2023-01-08 MED ORDER — SODIUM CHLORIDE 0.9 % IV SOLN: 250.0000 mL | INTRAVENOUS | Status: DC | PRN

## 2023-01-08 MED ORDER — SODIUM CHLORIDE 0.9% FLUSH: 3.0000 mL | INTRAVENOUS | Status: DC | PRN

## 2023-01-08 MED ORDER — SODIUM CHLORIDE 0.9% FLUSH
3.0000 mL | Freq: Two times a day (BID) | INTRAVENOUS | Status: DC
  Administered 2023-01-08 – 2023-01-09 (×3): 3 mL via INTRAVENOUS

## 2023-01-08 MED ORDER — WARFARIN SODIUM 2.5 MG PO TABS
2.5000 mg | ORAL_TABLET | Freq: Every day | ORAL | Status: AC
  Administered 2023-01-08: 2.5 mg via ORAL
  Filled 2023-01-08: qty 1

## 2023-01-08 MED ORDER — ~~LOC~~ CARDIAC SURGERY, PATIENT & FAMILY EDUCATION: Freq: Once | Status: AC

## 2023-01-08 NOTE — Progress Notes (Signed)
[address]       [place] [address]             [phone]                 3 Days Post-Op Procedure(s) (LRB): SUBXYPHOID PERICARDIAL WINDOW (N/A) TRANSESOPHAGEAL ECHOCARDIOGRAM (N/A)   Events: No events Ambulating well _______________________________________________________________ Vitals: BP 100/68   Pulse 78   Temp 98.6 F (37 C) (Oral)   Resp (!) 38   Ht 5\' 7"  (1.702 m)   Wt 108.4 kg   SpO2 93%   BMI 37.43 kg/m  Filed Weights   01/05/23 0834 01/06/23 0600 01/07/23 0630  Weight: 109 kg 109.2 kg 108.4 kg     - Neuro: alert NAD  - Cardiovascular: sinsu  Drips: none.      - Pulm: EWOB    ABG    Component Value Date/Time   PHART 7.334 (L) 12/27/2022 2024   PCO2ART 42.2 12/27/2022 2024   PO2ART 137 (H) 12/27/2022 2024   HCO3 22.5 12/27/2022 2024   TCO2 24 12/27/2022 2024   ACIDBASEDEF 3.0 (H) 12/27/2022 2024   O2SAT 99 12/27/2022 2024    - Abd: ND - Extremity: trace  .Intake/Output      01/26 0701 01/27 0700 01/27 0701 01/28 0700   P.Douglas Phillips. 500    I.V. (mL/kg)     Total Intake(mL/kg) 500 (4.6)    Urine (mL/kg/hr) 1050 (0.4)    Drains 40 20   Stool 0    Total Output 1090 20   Net -590 -20        Urine Occurrence 2 x    Stool Occurrence 1 x       _______________________________________________________________ Labs:    Latest Ref Rng & Units 01/08/2023    6:57 AM 01/06/2023    4:04 AM 01/05/2023   11:30 AM  CBC  WBC 4.0 - 10.5 K/uL 12.2  15.8  14.0   Hemoglobin 13.0 - 17.0 g/dL 10.0  8.3  8.3   Hematocrit 39.0 - 52.0 % 28.7  24.8  25.4   Platelets 150 - 400 K/uL 451  350  314       Latest Ref Rng & Units 01/08/2023    6:57 AM 01/06/2023    4:04 AM 01/05/2023    7:42 AM  CMP  Glucose 70 - 99 mg/dL 141  202  171   BUN 6 - 20 mg/dL 14  22  16    Creatinine 0.61 - 1.24 mg/dL 0.86  0.82  0.97   Sodium 135 - 145 mmol/L 136  131  133   Potassium 3.5 - 5.1 mmol/L 3.9  3.9  3.7   Chloride 98 - 111 mmol/L 98  96  96    CO2 22 - 32 mmol/L 27  26  27    Calcium 8.9 - 10.3 mg/dL 9.0  8.3  8.6     CXR: -  _______________________________________________________________  Assessment and Plan: POD 3 s/p pericardial window  Neuro: pain controlled CV: will remove CT today Pulm: IS, ambulation  Renal: creat stable GI: on diet Heme: INR 1.9 ID: afebrile Endo: SSI Dispo: floor today   Douglas Phillips Douglas Phillips Douglas Phillips 01/08/2023 11:24 AM

## 2023-01-09 ENCOUNTER — Other Ambulatory Visit: Payer: Self-pay

## 2023-01-09 LAB — BASIC METABOLIC PANEL
Anion gap: 8 (ref 5–15)
BUN: 12 mg/dL (ref 6–20)
CO2: 28 mmol/L (ref 22–32)
Calcium: 8.4 mg/dL — ABNORMAL LOW (ref 8.9–10.3)
Chloride: 100 mmol/L (ref 98–111)
Creatinine, Ser: 0.84 mg/dL (ref 0.61–1.24)
GFR, Estimated: >60 mL/min (ref >60)
Glucose, Bld: 216 mg/dL — ABNORMAL HIGH (ref 70–99)
Potassium: 3.6 mmol/L (ref 3.5–5.1)
Sodium: 136 mmol/L (ref 135–145)

## 2023-01-09 LAB — CBC
HCT: 26.7 % — ABNORMAL LOW (ref 39.0–52.0)
Hemoglobin: 9.1 g/dL — ABNORMAL LOW (ref 13.0–17.0)
MCH: 30 pg (ref 26.0–34.0)
MCHC: 34.1 g/dL (ref 30.0–36.0)
MCV: 88.1 fL (ref 80.0–100.0)
Platelets: 405 10*3/uL — ABNORMAL HIGH (ref 150–400)
RBC: 3.03 MIL/uL — ABNORMAL LOW (ref 4.22–5.81)
RDW: 12.7 % (ref 11.5–15.5)
WBC: 10.6 10*3/uL — ABNORMAL HIGH (ref 4.0–10.5)
nRBC: 0 % (ref 0.0–0.2)

## 2023-01-09 LAB — PROTIME-INR
INR: 1.8 — ABNORMAL HIGH (ref 0.8–1.2)
Prothrombin Time: 21.1 seconds — ABNORMAL HIGH (ref 11.4–15.2)

## 2023-01-09 LAB — GLUCOSE, CAPILLARY
Glucose-Capillary: 219 mg/dL — ABNORMAL HIGH (ref 70–99)
Glucose-Capillary: 219 mg/dL — ABNORMAL HIGH (ref 70–99)

## 2023-01-09 MED ORDER — WARFARIN SODIUM 2.5 MG PO TABS: 2.5000 mg | ORAL_TABLET | Freq: Every day | ORAL | Status: DC

## 2023-01-09 MED ORDER — POTASSIUM CHLORIDE CRYS ER 20 MEQ PO TBCR
20.0000 meq | EXTENDED_RELEASE_TABLET | ORAL | Status: DC
  Administered 2023-01-09 (×2): 20 meq via ORAL
  Filled 2023-01-09: qty 1

## 2023-01-09 MED ORDER — WARFARIN SODIUM 5 MG PO TABS
5.0000 mg | ORAL_TABLET | Freq: Every day | ORAL | Status: AC
  Administered 2023-01-09: 5 mg via ORAL
  Filled 2023-01-09: qty 1

## 2023-01-09 MED ORDER — WARFARIN SODIUM 5 MG PO TABS: 5.0000 mg | ORAL_TABLET | Freq: Every day | ORAL | Status: DC

## 2023-01-09 NOTE — Discharge Summary (Signed)
Physician Discharge Summary   Patient ID: Douglas Phillips [id number] 47 y.o. [birth year]  Admit date: 01/04/2023  Discharge date and time: No discharge date for patient encounter.   Admitting Physician: Gaye Pollack, MD   Discharge Physician: Lajuana Matte   Admission Diagnoses: Pericardial effusion [I31.39]  Discharge Diagnoses: same  Admission Condition: poor  Discharged Condition: good  Indication for Admission: pericardial effusion with tamponade physiology  Hospital Course: Uncomplicated.  He was taken to the OR on 01/05/23 for a pericardial window.  Chest tubes were sequentially removed.  INR was 1.9 on the day of discharge.  He was given a dose of 5mg  of coumadin on the 1/28, and will be discharge on 2.5mg  with close follow-up in afib clinic for a coumadin check  Consults: None    Discharge Exam: Alert NAD Sinus EWOB ND   Disposition: Discharge disposition: 01-Home or Self Care       Patient Instructions:   Activity: no lifting, driving, or strenuous exercise for 6weeks Diet: regular diet Wound Care: keep wound clean and dry  Follow-up with Coumadin clinic in 2 days.  SignedLajuana Matte 01/09/2023 11:38 AM

## 2023-01-09 NOTE — Progress Notes (Addendum)
[address]       [place] [address]             [phone]                 4 Days Post-Op Procedure(s) (LRB): SUBXYPHOID PERICARDIAL WINDOW (N/A) TRANSESOPHAGEAL ECHOCARDIOGRAM (N/A)   Events: No events Ambulating well _______________________________________________________________ Vitals: BP 121/73   Pulse 65   Temp 98.4 F (36.9 C) (Oral)   Resp (!) 28   Ht 5\' 7"  (1.702 m)   Wt 107.9 kg   SpO2 98%   BMI 37.26 kg/m  Filed Weights   01/06/23 0600 01/07/23 0630 01/09/23 0500  Weight: 109.2 kg 108.4 kg 107.9 kg     - Neuro: alert NAD  - Cardiovascular: sinus  Drips: none.      - Pulm: EWOB    ABG    Component Value Date/Time   PHART 7.334 (L) 12/27/2022 2024   PCO2ART 42.2 12/27/2022 2024   PO2ART 137 (H) 12/27/2022 2024   HCO3 22.5 12/27/2022 2024   TCO2 24 12/27/2022 2024   ACIDBASEDEF 3.0 (H) 12/27/2022 2024   O2SAT 99 12/27/2022 2024    - Abd: ND - Extremity: trace  .Intake/Output      01/27 0701 01/28 0700 01/28 0701 01/29 0700   P.O. 300    Total Intake(mL/kg) 300 (2.8)    Urine (mL/kg/hr)     Drains 20    Stool     Total Output 20    Net +280         Urine Occurrence 2 x       _______________________________________________________________ Labs:    Latest Ref Rng & Units 01/09/2023    3:34 AM 01/08/2023    6:57 AM 01/06/2023    4:04 AM  CBC  WBC 4.0 - 10.5 K/uL 10.6  12.2  15.8   Hemoglobin 13.0 - 17.0 g/dL 9.1  10.0  8.3   Hematocrit 39.0 - 52.0 % 26.7  28.7  24.8   Platelets 150 - 400 K/uL 405  451  350       Latest Ref Rng & Units 01/09/2023    3:34 AM 01/08/2023    6:57 AM 01/06/2023    4:04 AM  CMP  Glucose 70 - 99 mg/dL 216  141  202   BUN 6 - 20 mg/dL 12  14  22    Creatinine 0.61 - 1.24 mg/dL 0.84  0.86  0.82   Sodium 135 - 145 mmol/L 136  136  131   Potassium 3.5 - 5.1 mmol/L 3.6  3.9  3.9   Chloride 98 - 111 mmol/L 100  98  96   CO2 22 - 32 mmol/L 28  27  26    Calcium 8.9 - 10.3 mg/dL  8.4  9.0  8.3     CXR: -  _______________________________________________________________  Assessment and Plan: POD 4 s/p pericardial window  Neuro: pain controlled CV: on statin Pulm: IS, ambulation  Renal: creat stable GI: on diet Heme: INR 1.8.  increasing couamding to 5.  Will go home on 2.5 ID: afebrile Endo: SSI Dispo: home today.  Coumadin of 2.5mg  at discharge  Lajuana Matte 01/09/2023 10:37 AM

## 2023-01-10 ENCOUNTER — Telehealth (HOSPITAL_COMMUNITY): Payer: Self-pay

## 2023-01-10 NOTE — Telephone Encounter (Signed)
Called patient to see if he is interested in the Cardiac Rehab Program. Patient expressed interest. Explained scheduling process, patient verbalized understanding. Will contact patient for scheduling once f/u has been completed.  

## 2023-01-11 ENCOUNTER — Other Ambulatory Visit: Payer: Self-pay

## 2023-01-11 ENCOUNTER — Ambulatory Visit: Payer: BC Managed Care – PPO | Attending: Cardiology

## 2023-01-11 DIAGNOSIS — Z952 Presence of prosthetic heart valve: Secondary | ICD-10-CM

## 2023-01-11 DIAGNOSIS — Z5181 Encounter for therapeutic drug level monitoring: Secondary | ICD-10-CM

## 2023-01-11 DIAGNOSIS — I3139 Other pericardial effusion (noninflammatory): Secondary | ICD-10-CM | POA: Diagnosis not present

## 2023-01-11 DIAGNOSIS — Z7901 Long term (current) use of anticoagulants: Secondary | ICD-10-CM

## 2023-01-11 DIAGNOSIS — I824Y9 Acute embolism and thrombosis of unspecified deep veins of unspecified proximal lower extremity: Secondary | ICD-10-CM | POA: Insufficient documentation

## 2023-01-11 LAB — POCT INR: INR: 2.6 (ref 2.0–3.0)

## 2023-01-11 NOTE — Patient Instructions (Signed)
TAKE 1 TABLET DAILY.  INR in 1 week. ONX- VALVE AORTIC  A full discussion of the nature of anticoagulants has been carried out.  A benefit risk analysis has been presented to the patient, so that they understand the justification for choosing anticoagulation at this time. The need for frequent and regular monitoring, precise dosage adjustment and compliance is stressed.  Side effects of potential bleeding are discussed.  The patient should avoid any OTC items containing aspirin or ibuprofen, and should avoid great swings in general diet.  Avoid alcohol consumption.  Call if any signs of abnormal bleeding.  [phone]

## 2023-01-13 ENCOUNTER — Encounter: Payer: Self-pay | Admitting: Surgery

## 2023-01-18 ENCOUNTER — Ambulatory Visit: Payer: BC Managed Care – PPO | Attending: Physician Assistant | Admitting: Physician Assistant

## 2023-01-18 ENCOUNTER — Encounter: Payer: Self-pay | Admitting: Physician Assistant

## 2023-01-18 ENCOUNTER — Ambulatory Visit (INDEPENDENT_AMBULATORY_CARE_PROVIDER_SITE_OTHER): Payer: BC Managed Care – PPO | Admitting: *Deleted

## 2023-01-18 VITALS — BP 122/64 | HR 76 | Ht 67.0 in | Wt 234.0 lb

## 2023-01-18 DIAGNOSIS — Z952 Presence of prosthetic heart valve: Secondary | ICD-10-CM

## 2023-01-18 DIAGNOSIS — I3139 Other pericardial effusion (noninflammatory): Secondary | ICD-10-CM

## 2023-01-18 DIAGNOSIS — E1165 Type 2 diabetes mellitus with hyperglycemia: Secondary | ICD-10-CM | POA: Diagnosis not present

## 2023-01-18 DIAGNOSIS — I2510 Atherosclerotic heart disease of native coronary artery without angina pectoris: Secondary | ICD-10-CM | POA: Diagnosis not present

## 2023-01-18 DIAGNOSIS — Z7901 Long term (current) use of anticoagulants: Secondary | ICD-10-CM

## 2023-01-18 DIAGNOSIS — E785 Hyperlipidemia, unspecified: Secondary | ICD-10-CM

## 2023-01-18 DIAGNOSIS — Z794 Long term (current) use of insulin: Secondary | ICD-10-CM

## 2023-01-18 DIAGNOSIS — G4733 Obstructive sleep apnea (adult) (pediatric): Secondary | ICD-10-CM

## 2023-01-18 DIAGNOSIS — R82998 Other abnormal findings in urine: Secondary | ICD-10-CM

## 2023-01-18 LAB — CBC

## 2023-01-18 LAB — UA/M W/RFLX CULTURE, ROUTINE

## 2023-01-18 LAB — POCT INR: INR: 2.1 (ref 2.0–3.0)

## 2023-01-18 NOTE — Patient Instructions (Addendum)
Medication Instructions:  Your physician recommends that you continue on your current medications as directed. Please refer to the Current Medication list given to you today.  *If you need a refill on your cardiac medications before your next appointment, please call your pharmacy*   Lab Work: Your physician recommends that you complete labs today. UA with culture, CBC, BMET. Fasting Lipid, Lpa   If you have labs (blood work) drawn today and your tests are completely normal, you will receive your results only by: MyChart Message (if you have MyChart) OR A paper copy in the mail If you have any lab test that is abnormal or we need to change your treatment, we will call you to review the results.   Testing/Procedures: NONE ordered at this time of appointment     Follow-Up: At [facility], you and your health needs are our priority.  As part of our continuing mission to provide you with exceptional heart care, we have created designated Provider Care Teams.  These Care Teams include your primary Cardiologist (physician) and Advanced Practice Providers (APPs -  Physician Assistants and Nurse Practitioners) who all work together to provide you with the care you need, when you need it.  We recommend signing up for the patient portal called "MyChart".  Sign up information is provided on this After Visit Summary.  MyChart is used to connect with patients for Virtual Visits (Telemedicine).  Patients are able to view lab/test results, encounter notes, upcoming appointments, etc.  Non-urgent messages can be sent to your provider as well.   To learn more about what you can do with MyChart, go to [email or web address].    Your next appointment:   3 month(s)  Provider:   Janina Mayo, MD     Other Instructions

## 2023-01-18 NOTE — Patient Instructions (Signed)
Description   Today take 1.5 tablets of warfarin then continue taking 1 tablet daily. Recheck INR in 1 week. Anticoagulation Clinic [phone]

## 2023-01-19 LAB — LIPOPROTEIN A (LPA): Lipoprotein (a): 27.9 nmol/L (ref <75.0)

## 2023-01-19 LAB — BASIC METABOLIC PANEL
BUN/Creatinine Ratio: 15 (ref 9–20)
BUN: 13 mg/dL (ref 6–24)
CO2: 23 mmol/L (ref 20–29)
Calcium: 9.4 mg/dL (ref 8.7–10.2)
Chloride: 106 mmol/L (ref 96–106)
Creatinine, Ser: 0.89 mg/dL (ref 0.76–1.27)
Glucose: 107 mg/dL — ABNORMAL HIGH (ref 70–99)
Potassium: 4.6 mmol/L (ref 3.5–5.2)
Sodium: 143 mmol/L (ref 134–144)
eGFR: 108 mL/min/{1.73_m2} (ref >59)

## 2023-01-19 LAB — MICROSCOPIC EXAMINATION
Bacteria, UA: NONE SEEN
Casts: NONE SEEN /lpf
Epithelial Cells (non renal): NONE SEEN /hpf (ref 0–10)
RBC, Urine: >30 /hpf — AB (ref 0–2)
WBC, UA: NONE SEEN /hpf (ref 0–5)

## 2023-01-19 LAB — CBC
Hematocrit: 27.7 % — ABNORMAL LOW (ref 37.5–51.0)
Hemoglobin: 8.9 g/dL — ABNORMAL LOW (ref 13.0–17.7)
MCH: 29.2 pg (ref 26.6–33.0)
MCHC: 32.1 g/dL (ref 31.5–35.7)
MCV: 91 fL (ref 79–97)
Platelets: 379 10*3/uL (ref 150–450)
RBC: 3.05 x10E6/uL — ABNORMAL LOW (ref 4.14–5.80)
RDW: 12.6 % (ref 11.6–15.4)
WBC: 8.4 10*3/uL (ref 3.4–10.8)

## 2023-01-19 LAB — UA/M W/RFLX CULTURE, ROUTINE
Bilirubin, UA: NEGATIVE
Glucose, UA: NEGATIVE
Ketones, UA: NEGATIVE
Leukocytes,UA: NEGATIVE
Nitrite, UA: NEGATIVE
Specific Gravity, UA: 1.026 (ref 1.005–1.030)
Urobilinogen, Ur: 0.2 mg/dL (ref 0.2–1.0)
pH, UA: 5.5 (ref 5.0–7.5)

## 2023-01-19 LAB — LIPID PANEL
Chol/HDL Ratio: 3.8 ratio (ref 0.0–5.0)
Cholesterol, Total: 98 mg/dL — ABNORMAL LOW (ref 100–199)
HDL: 26 mg/dL — ABNORMAL LOW (ref >39)
LDL Chol Calc (NIH): 55 mg/dL (ref 0–99)
Triglycerides: 81 mg/dL (ref 0–149)
VLDL Cholesterol Cal: 17 mg/dL (ref 5–40)

## 2023-01-21 ENCOUNTER — Other Ambulatory Visit: Payer: Self-pay | Admitting: *Deleted

## 2023-01-21 DIAGNOSIS — Z7901 Long term (current) use of anticoagulants: Secondary | ICD-10-CM

## 2023-01-21 DIAGNOSIS — Z952 Presence of prosthetic heart valve: Secondary | ICD-10-CM

## 2023-01-25 ENCOUNTER — Other Ambulatory Visit: Payer: Self-pay | Admitting: Surgery

## 2023-01-25 DIAGNOSIS — Z952 Presence of prosthetic heart valve: Secondary | ICD-10-CM

## 2023-01-26 ENCOUNTER — Ambulatory Visit: Payer: BC Managed Care – PPO | Attending: Internal Medicine

## 2023-01-26 ENCOUNTER — Ambulatory Visit
Admission: RE | Admit: 2023-01-26 | Discharge: 2023-01-26 | Disposition: A | Discharge status: Home / Self Care | Payer: BC Managed Care – PPO | Source: Ambulatory Visit | Attending: Surgery | Admitting: Surgery

## 2023-01-26 ENCOUNTER — Encounter: Payer: Self-pay | Admitting: Surgery

## 2023-01-26 ENCOUNTER — Ambulatory Visit (INDEPENDENT_AMBULATORY_CARE_PROVIDER_SITE_OTHER): Payer: Self-pay | Admitting: Surgery

## 2023-01-26 VITALS — BP 113/73 | HR 60 | Resp 18 | Ht 67.0 in | Wt 243.0 lb

## 2023-01-26 DIAGNOSIS — I3139 Other pericardial effusion (noninflammatory): Secondary | ICD-10-CM

## 2023-01-26 DIAGNOSIS — Z5181 Encounter for therapeutic drug level monitoring: Secondary | ICD-10-CM

## 2023-01-26 DIAGNOSIS — Z952 Presence of prosthetic heart valve: Secondary | ICD-10-CM | POA: Diagnosis not present

## 2023-01-26 DIAGNOSIS — I7121 Aneurysm of the ascending aorta, without rupture: Secondary | ICD-10-CM

## 2023-01-26 DIAGNOSIS — I517 Cardiomegaly: Secondary | ICD-10-CM | POA: Diagnosis not present

## 2023-01-26 DIAGNOSIS — J90 Pleural effusion, not elsewhere classified: Secondary | ICD-10-CM | POA: Diagnosis not present

## 2023-01-26 DIAGNOSIS — Z09 Encounter for follow-up examination after completed treatment for conditions other than malignant neoplasm: Secondary | ICD-10-CM

## 2023-01-26 LAB — POCT INR: INR: 2.8 (ref 2.0–3.0)

## 2023-01-26 NOTE — Progress Notes (Signed)
HPI: Patient returns for routine postoperative follow-up having undergone replacement of ascending aorta under deep hypothermic circulatory arrest using a 28 x 10 mm Hemashield Platinum graft and Bentall procedure using a 27/29 mm On-X mechanical valved graft on 12/27/2022. The patient's early postoperative recovery while in the hospital was notable for prolonged nausea due to gastroparesis.  He had an unremarkable postoperative course otherwise and was discharged on 01/01/2023 on Coumadin 2.5 mg daily.  His INR at the time of discharge was 2.3.  He did well initially but then presented to the office with nausea and feeling generally weak and poor.  He had a 2D echocardiogram showing a moderate size circumferential pericardial effusion with early signs of tamponade per cardiology.  The prosthetic valve is functioning normally.  He was taken back to the operating room on 01/05/2023 for subxiphoid pericardial window with drainage of 400 cc of old bloody fluid.  Postoperatively he felt much better and the drain was removed within a couple days and he was discharged home therapeutic on Coumadin. Since hospital discharge the patient reports that he has been feeling better.  He is walking daily without chest pain or shortness of breath.  His stamina has been improving.  His INR was checked today and it was 2.8.   Current Outpatient Medications  Medication Sig Dispense Refill   acetaminophen (TYLENOL) 500 MG tablet Take 2 tablets (1,000 mg total) by mouth every 6 (six) hours as needed for mild pain or fever. 30 tablet 0   atorvastatin (LIPITOR) 40 MG tablet Take 1 tablet (40 mg total) by mouth daily. (Patient taking differently: Take 40 mg by mouth at bedtime.) 90 tablet 3   blood glucose meter kit and supplies Dispense based on patient and insurance preference. Use one time daily. ICD10: E11.65 1 each 0   fenofibrate 160 MG tablet Take 160 mg by mouth daily.     glipiZIDE (GLUCOTROL) 5 MG tablet Take 5 mg by  mouth at bedtime.     glucose blood (ONETOUCH VERIO) test strip 1 each by Other route in the morning and at bedtime. Use as instructed 100 each 1   [START ON 01/27/2023] lamoTRIgine (LAMICTAL) 100 MG tablet Take 1 tablet (100 mg total) by mouth 2 (two) times daily. (Patient not taking: Reported on 01/18/2023) 60 tablet 5   lamoTRIgine (LAMICTAL) 25 MG tablet Take 1 pill at bedtime for 2 weeks. Then take 1 pill twice a day for 2 weeks. Then take 1 pill in AM and 2 pills in PM for one week. Then take 2 pills twice a day for one week. Then take 2 pills in AM and 3 pills in PM for one week. Then take 3 pills twice a day for one week. Then take 3 pills in AM and 4 pills in PM for one week. Then take 4 pills twice a day until pills are gone. Then start 100 mg tablets. (Patient not taking: Reported on 01/18/2023) 273 tablet 0   metoprolol tartrate (LOPRESSOR) 25 MG tablet Take 0.5 tablets (12.5 mg total) by mouth 2 (two) times daily. 30 tablet 3   NOVOLIN 70/30 KWIKPEN (70-30) 100 UNIT/ML KwikPen Inject 18 Units into the skin in the morning and at bedtime.     oxyCODONE (OXY IR/ROXICODONE) 5 MG immediate release tablet Take 1 tablet (5 mg total) by mouth every 4 (four) hours as needed for severe pain. (Patient not taking: Reported on 01/18/2023) 30 tablet 0   pioglitazone (ACTOS) 15 MG tablet  Take 15 mg by mouth in the morning.     promethazine (PHENERGAN) 12.5 MG tablet Take 1 tablet (12.5 mg total) by mouth every 6 (six) hours as needed for nausea or vomiting. (Patient not taking: Reported on 01/18/2023) 15 tablet 0   VITAMIN D PO Take 2,000 Units by mouth daily.     warfarin (COUMADIN) 2.5 MG tablet Take 1 tablet (2.5 mg total) by mouth daily. 30 tablet 11   ZENPEP 40000-126000 units CPEP Take 2 capsules by mouth with breakfast, with lunch, and with evening meal.     No current facility-administered medications for this visit.    Physical Exam: BP 113/73 (BP Location: Right Arm, Patient Position: Sitting)    Pulse 60   Resp 18   Ht 5' 7"$  (1.702 m)   Wt 243 lb (110.2 kg)   SpO2 98% Comment: RA  BMI 38.06 kg/m  He looks well. Cardiac exam shows a regular rate and rhythm with crisp mechanical valve sounds.  There is no murmur. Lungs are clear. The chest incision is healing well and the sternum is stable. There is no peripheral edema.  Diagnostic Tests:  Narrative & Impression  CLINICAL DATA:  Status post aortic valve repair.   EXAM: CHEST - 2 VIEW   COMPARISON:  January 06, 2023.   FINDINGS: Sternotomy wires are noted. Stable cardiomegaly. Swan-Ganz catheter has been removed. Minimal left pleural effusion is noted. Right lung is clear. Bony thorax is unremarkable.   IMPRESSION: Minimal left pleural effusion.     Electronically Signed   By: Marijo Conception M.D.   On: 01/26/2023 09:59      Impression:  Overall I think he is making a good recovery following his surgery.  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  This seems to be resolved.  I encouraged him to continue walking is much as possible.  I told him he could drive a car at this time but should refrain lifting anything heavier than 10 pounds for 3 months postoperatively.  Plan:  I will see him back in the office in 3-4 weeks for follow-up.  He is scheduled to have a baseline follow-up echocardiogram in early March.   Gaye Pollack, MD [facility] [phone]

## 2023-01-26 NOTE — Patient Instructions (Signed)
Description   Continue taking 1 tablet daily.  Stay consistent with greens each  Recheck INR in 1 week.  Anticoagulation Clinic [phone]

## 2023-02-02 ENCOUNTER — Ambulatory Visit: Payer: BC Managed Care – PPO | Attending: Cardiology | Admitting: *Deleted

## 2023-02-02 DIAGNOSIS — I3139 Other pericardial effusion (noninflammatory): Secondary | ICD-10-CM

## 2023-02-02 DIAGNOSIS — Z7901 Long term (current) use of anticoagulants: Secondary | ICD-10-CM | POA: Diagnosis not present

## 2023-02-02 DIAGNOSIS — Z952 Presence of prosthetic heart valve: Secondary | ICD-10-CM | POA: Diagnosis not present

## 2023-02-02 LAB — POCT INR: INR: 2.2 (ref 2.0–3.0)

## 2023-02-02 NOTE — Patient Instructions (Signed)
Description   Continue taking warfarin 1 tablet daily. Stay consistent with greens each  Recheck INR in 2 weeks. Anticoagulation Clinic [phone]

## 2023-02-03 LAB — CBC
Hematocrit: 31.6 % — ABNORMAL LOW (ref 37.5–51.0)
Hemoglobin: 10 g/dL — ABNORMAL LOW (ref 13.0–17.7)
MCH: 28.2 pg (ref 26.6–33.0)
MCHC: 31.6 g/dL (ref 31.5–35.7)
MCV: 89 fL (ref 79–97)
Platelets: 348 10*3/uL (ref 150–450)
RBC: 3.54 x10E6/uL — ABNORMAL LOW (ref 4.14–5.80)
RDW: 12.6 % (ref 11.6–15.4)
WBC: 5.3 10*3/uL (ref 3.4–10.8)

## 2023-02-11 ENCOUNTER — Ambulatory Visit (HOSPITAL_COMMUNITY): Payer: BC Managed Care – PPO | Attending: Cardiology

## 2023-02-11 DIAGNOSIS — Z952 Presence of prosthetic heart valve: Secondary | ICD-10-CM

## 2023-02-11 LAB — ECHOCARDIOGRAM LIMITED
AR max vel: 2.06 cm2
AV Area VTI: 1.87 cm2
AV Area mean vel: 2.11 cm2
AV Mean grad: 7 mmHg
AV Peak grad: 12.9 mmHg
Ao pk vel: 1.8 m/s
Area-P 1/2: 2.56 cm2
S' Lateral: 3.5 cm

## 2023-02-15 ENCOUNTER — Telehealth (HOSPITAL_COMMUNITY): Payer: Self-pay

## 2023-02-15 NOTE — Telephone Encounter (Signed)
Pt insurance is active and benefits verified through [place]. Co-pay $0.00, DED $5,000.00/$5,000.00 met, out of pocket $7,500.00/$7,500.00 met, co-insurance 30%. No pre-authorization required. Passport, 02/15/23 @ 3:20PM, [id number]   How many CR sessions are covered? (36 sessions for TCR, 72 sessions for ICR)72 Is this a lifetime maximum or an annual maximum? Lifetime Has the member used any of these services to date? No Is there a time limit (weeks/months) on start of program and/or program completion? No     Will contact patient to see if he is interested in the Cardiac Rehab Program.

## 2023-02-16 ENCOUNTER — Ambulatory Visit: Payer: BC Managed Care – PPO | Attending: Internal Medicine | Admitting: *Deleted

## 2023-02-16 DIAGNOSIS — Z952 Presence of prosthetic heart valve: Secondary | ICD-10-CM

## 2023-02-16 DIAGNOSIS — I3139 Other pericardial effusion (noninflammatory): Secondary | ICD-10-CM | POA: Diagnosis not present

## 2023-02-16 DIAGNOSIS — Z7901 Long term (current) use of anticoagulants: Secondary | ICD-10-CM | POA: Diagnosis not present

## 2023-02-16 LAB — POCT INR: INR: 1.3 — AB (ref 2.0–3.0)

## 2023-02-16 NOTE — Patient Instructions (Addendum)
Description   Today and tomorrow take 1.5 tablets of warfarin continue taking warfarin 1 tablet daily. Stay consistent with greens each. Recheck INR in 1 week. Anticoagulation Clinic [phone]

## 2023-02-17 ENCOUNTER — Encounter (HOSPITAL_COMMUNITY)
Admission: RE | Admit: 2023-02-17 | Discharge: 2023-02-17 | Disposition: A | Discharge status: Home / Self Care | Payer: BC Managed Care – PPO | Source: Ambulatory Visit | Attending: Internal Medicine | Admitting: Internal Medicine

## 2023-02-17 VITALS — BP 118/76 | HR 65 | Ht 68.75 in | Wt 241.0 lb

## 2023-02-17 DIAGNOSIS — Z95828 Presence of other vascular implants and grafts: Secondary | ICD-10-CM

## 2023-02-17 DIAGNOSIS — Z952 Presence of prosthetic heart valve: Secondary | ICD-10-CM | POA: Diagnosis not present

## 2023-02-17 DIAGNOSIS — R140 Abdominal distension (gaseous): Secondary | ICD-10-CM | POA: Diagnosis not present

## 2023-02-17 DIAGNOSIS — Z5189 Encounter for other specified aftercare: Secondary | ICD-10-CM | POA: Diagnosis not present

## 2023-02-17 DIAGNOSIS — I7121 Aneurysm of the ascending aorta, without rupture: Secondary | ICD-10-CM | POA: Diagnosis not present

## 2023-02-17 DIAGNOSIS — K8681 Exocrine pancreatic insufficiency: Secondary | ICD-10-CM | POA: Diagnosis not present

## 2023-02-17 NOTE — Progress Notes (Signed)
Cardiac Individual Treatment Plan  Patient Details  Name: Douglas Phillips MRN: [id number] Date of Birth: 1976-08-30 Referring Provider:   Flowsheet Row INTENSIVE CARDIAC REHAB ORIENT from 02/17/2023 in [facility] for Heart, Vascular, & [place]  Referring Provider Phineas Inches, MD       Initial Encounter Date:  [place] from 02/17/2023 in [facility] for Heart, Vascular, & [facility]  Date 02/17/23       Visit Diagnosis: 12/27/22 AVR (aortic valve replacement)  12/27/22 ascending aorta replacement  Patient's Home Medications on Admission:  Current Outpatient Medications:    acetaminophen (TYLENOL) 500 MG tablet, Take 2 tablets (1,000 mg total) by mouth every 6 (six) hours as needed for mild pain or fever., Disp: 30 tablet, Rfl: 0   atorvastatin (LIPITOR) 40 MG tablet, Take 1 tablet (40 mg total) by mouth daily. (Patient taking differently: Take 40 mg by mouth at bedtime.), Disp: 90 tablet, Rfl: 3   blood glucose meter kit and supplies, Dispense based on patient and insurance preference. Use one time daily. ICD10: E11.65, Disp: 1 each, Rfl: 0   fenofibrate 160 MG tablet, Take 160 mg by mouth daily., Disp: , Rfl:    glipiZIDE (GLUCOTROL) 5 MG tablet, Take 5 mg by mouth at bedtime., Disp: , Rfl:    glucose blood (ONETOUCH VERIO) test strip, 1 each by Other route in the morning and at bedtime. Use as instructed, Disp: 100 each, Rfl: 1   lamoTRIgine (LAMICTAL) 100 MG tablet, Take 1 tablet (100 mg total) by mouth 2 (two) times daily. (Patient not taking: Reported on 01/18/2023), Disp: 60 tablet, Rfl: 5   lamoTRIgine (LAMICTAL) 25 MG tablet, Take 1 pill at bedtime for 2 weeks. Then take 1 pill twice a day for 2 weeks. Then take 1 pill in AM and 2 pills in PM for one week. Then take 2 pills twice a day for one week. Then take 2 pills in AM and 3 pills in PM for one week. Then take 3 pills twice a day for one  week. Then take 3 pills in AM and 4 pills in PM for one week. Then take 4 pills twice a day until pills are gone. Then start 100 mg tablets. (Patient not taking: Reported on 01/18/2023), Disp: 273 tablet, Rfl: 0   metoprolol tartrate (LOPRESSOR) 25 MG tablet, Take 0.5 tablets (12.5 mg total) by mouth 2 (two) times daily., Disp: 30 tablet, Rfl: 3   NOVOLIN 70/30 KWIKPEN (70-30) 100 UNIT/ML KwikPen, Inject 18 Units into the skin in the morning and at bedtime., Disp: , Rfl:    oxyCODONE (OXY IR/ROXICODONE) 5 MG immediate release tablet, Take 1 tablet (5 mg total) by mouth every 4 (four) hours as needed for severe pain. (Patient not taking: Reported on 01/18/2023), Disp: 30 tablet, Rfl: 0   pioglitazone (ACTOS) 15 MG tablet, Take 15 mg by mouth in the morning., Disp: , Rfl:    promethazine (PHENERGAN) 12.5 MG tablet, Take 1 tablet (12.5 mg total) by mouth every 6 (six) hours as needed for nausea or vomiting. (Patient not taking: Reported on 01/18/2023), Disp: 15 tablet, Rfl: 0   VITAMIN D PO, Take 2,000 Units by mouth daily., Disp: , Rfl:    warfarin (COUMADIN) 2.5 MG tablet, Take 1 tablet (2.5 mg total) by mouth daily., Disp: 30 tablet, Rfl: 11   ZENPEP 40000-126000 units CPEP, Take 2 capsules by mouth with breakfast, with lunch, and with  evening meal., Disp: , Rfl:   Past Medical History: Past Medical History:  Diagnosis Date   Diabetes mellitus without complication ([place])    type 2   High cholesterol    Hypertension    Kidney stones    Migraine    MIGRAINE HEADACHE 04/05/2007   Qualifier: Diagnosis of  By: Larose Kells MD, [place] sleep 03/12/2019   Sleep apnea    wears CPAP    Tobacco Use: Social History   Tobacco Use  Smoking Status Never  Smokeless Tobacco Never    Labs: Review Flowsheet  More data exists      Latest Ref Rng & Units 10/12/2021 12/17/2022 12/27/2022 01/04/2023 01/18/2023  Labs for ITP Cardiac and Pulmonary Rehab  Cholestrol 100 - 199 mg/dL - - - - 98   LDL  (calc) 0 - 99 mg/dL - - - - 55   HDL-C >39 mg/dL - - - - 26   Trlycerides 0 - 149 mg/dL - - - - 81   Hemoglobin A1c 4.8 - 5.6 % 5.9  - - 5.8  -  PH, Arterial 7.35 - 7.45 - 7.364  7.334  7.364  7.313  7.410  7.286  7.228  7.403  7.331  - -  PCO2 arterial 32 - 48 mmHg - 40.5  42.2  37.7  44.0  34.9  50.3  58.2  38.5  43.1  - -  Bicarbonate 20.0 - 28.0 mmol/L - 24.1  24.2  23.1  22.5  21.5  22.8  22.1  23.9  24.3  24.8  24.0  22.8  - -  TCO2 22 - 32 mmol/L - '25  26  24  24  23  24  23  23  24  27  25  26  26  26  25  26  24  24  '$ - -  Acid-base deficit 0.0 - 2.0 mmol/L - 2.0  2.0  2.0  3.0  4.0  4.0  2.0  3.0  3.0  1.0  1.0  3.0  - -  O2 Saturation % - 74  82  95  99  100  97  100  100  100  88  100  100  - -    Capillary Blood Glucose: Lab Results  Component Value Date   GLUCAP 219 (H) 01/09/2023   GLUCAP 219 (H) 01/09/2023   GLUCAP 313 (H) 01/08/2023   GLUCAP 155 (H) 01/08/2023   GLUCAP 177 (H) 01/08/2023     Exercise Target Goals: Exercise Program Goal: Individual exercise prescription set using results from initial 6 min walk test and THRR while considering  patient's activity barriers and safety.   Exercise Prescription Goal: Initial exercise prescription builds to 30-45 minutes a day of aerobic activity, 2-3 days per week.  Home exercise guidelines will be given to patient during program as part of exercise prescription that the participant will acknowledge.  Activity Barriers & Risk Stratification:  Activity Barriers & Cardiac Risk Stratification - 02/17/23 1309       Activity Barriers & Cardiac Risk Stratification   Activity Barriers Back Problems;Deconditioning;Muscular Weakness;Other (comment)    Comments Sternal Precautions    Cardiac Risk Stratification High             6 Minute Walk:  6 Minute Walk     Row Name 02/17/23 0908         6 Minute Walk   Phase Initial  Distance 1591 feet     Walk Time 6 minutes     # of Rest Breaks 0     MPH 3     METS  4.2     RPE 11     Perceived Dyspnea  0     VO2 Peak 14.7     Symptoms No     Resting HR 64 bpm     Resting BP 118/76     Resting Oxygen Saturation  99 %     Exercise Oxygen Saturation  during 6 min walk 100 %     Max Ex. HR 77 bpm     Max Ex. BP 154/83     2 Minute Post BP 143/86              Oxygen Initial Assessment:   Oxygen Re-Evaluation:   Oxygen Discharge (Final Oxygen Re-Evaluation):   Initial Exercise Prescription:  Initial Exercise Prescription - 02/17/23 1300       Date of Initial Exercise RX and Referring Provider   Date 02/17/23    Referring Provider Phineas Inches, MD    Expected Discharge Date 04/29/23      Treadmill   MPH 2.7    Grade 1    Minutes 15    METs 3.44      Recumbant Bike   Level 2    RPM 60    Watts 50    Minutes 15    METs 3.43      Prescription Details   Frequency (times per week) 3    Duration Progress to 30 minutes of continuous aerobic without signs/symptoms of physical distress      Intensity   THRR 40-80% of Max Heartrate 70-140    Ratings of Perceived Exertion 11-13    Perceived Dyspnea 0-4      Progression   Progression Continue progressive overload as per policy without signs/symptoms or physical distress.      Resistance Training   Training Prescription Yes    Weight 4 lbs    Reps 10-15             Perform Capillary Blood Glucose checks as needed.  Exercise Prescription Changes:   Exercise Comments:   Exercise Goals and Review:   Exercise Goals     Row Name 02/17/23 1310             Exercise Goals   Increase Physical Activity Yes       Intervention Provide advice, education, support and counseling about physical activity/exercise needs.;Develop an individualized exercise prescription for aerobic and resistive training based on initial evaluation findings, risk stratification, comorbidities and participant's personal goals.       Expected Outcomes Short Term: Attend rehab on a regular  basis to increase amount of physical activity.;Long Term: Add in home exercise to make exercise part of routine and to increase amount of physical activity.;Long Term: Exercising regularly at least 3-5 days a week.       Increase Strength and Stamina Yes       Intervention Provide advice, education, support and counseling about physical activity/exercise needs.;Develop an individualized exercise prescription for aerobic and resistive training based on initial evaluation findings, risk stratification, comorbidities and participant's personal goals.       Expected Outcomes Short Term: Increase workloads from initial exercise prescription for resistance, speed, and METs.;Short Term: Perform resistance training exercises routinely during rehab and add in resistance training at home;Long Term: Improve cardiorespiratory fitness, muscular endurance and strength  as measured by increased METs and functional capacity (6MWT)       Able to understand and use rate of perceived exertion (RPE) scale Yes       Intervention Provide education and explanation on how to use RPE scale       Expected Outcomes Short Term: Able to use RPE daily in rehab to express subjective intensity level;Long Term:  Able to use RPE to guide intensity level when exercising independently       Knowledge and understanding of Target Heart Rate Range (THRR) Yes       Intervention Provide education and explanation of THRR including how the numbers were predicted and where they are located for reference       Expected Outcomes Short Term: Able to state/look up THRR;Short Term: Able to use daily as guideline for intensity in rehab;Long Term: Able to use THRR to govern intensity when exercising independently       Understanding of Exercise Prescription Yes       Intervention Provide education, explanation, and written materials on patient's individual exercise prescription       Expected Outcomes Short Term: Able to explain program exercise  prescription;Long Term: Able to explain home exercise prescription to exercise independently                Exercise Goals Re-Evaluation :   Discharge Exercise Prescription (Final Exercise Prescription Changes):   Nutrition:  Target Goals: Understanding of nutrition guidelines, daily intake of sodium '1500mg'$ , cholesterol '200mg'$ , calories 30% from fat and 7% or less from saturated fats, daily to have 5 or more servings of fruits and vegetables.  Biometrics:  Pre Biometrics - 02/17/23 0825       Pre Biometrics   Waist Circumference 48.5 inches    Hip Circumference 45 inches    Waist to Hip Ratio 1.08 %    Triceps Skinfold 25 mm    % Body Fat 35.9 %    Grip Strength 38 kg    Flexibility 11.25 in    Single Leg Stand 30 seconds              Nutrition Therapy Plan and Nutrition Goals:   Nutrition Assessments:  MEDIFICTS Score Key: ?70 Need to make dietary changes  40-70 Heart Healthy Diet ? 40 Therapeutic Level Cholesterol Diet    Picture Your Plate Scores: [id number] Unhealthy dietary pattern with much room for improvement. 41-50 Dietary pattern unlikely to meet recommendations for good health and room for improvement. 51-60 More healthful dietary pattern, with some room for improvement.  >60 Healthy dietary pattern, although there may be some specific behaviors that could be improved.    Nutrition Goals Re-Evaluation:   Nutrition Goals Re-Evaluation:   Nutrition Goals Discharge (Final Nutrition Goals Re-Evaluation):   Psychosocial: Target Goals: Acknowledge presence or absence of significant depression and/or stress, maximize coping skills, provide positive support system. Participant is able to verbalize types and ability to use techniques and skills needed for reducing stress and depression.  Initial Review & Psychosocial Screening:  Initial Psych Review & Screening - 02/17/23 1321       Initial Review   Current issues with None Identified      Family  Dynamics   Good Support System? Yes    Comments Pt has his spouse for support      Barriers   Psychosocial barriers to participate in program There are no identifiable barriers or psychosocial needs.      Screening Interventions  Interventions Encouraged to exercise             Quality of Life Scores:  Quality of Life - 02/17/23 1324       Quality of Life   Select Quality of Life      Quality of Life Scores   Health/Function Pre 20.73 %    Socioeconomic Pre 23.93 %    Psych/Spiritual Pre 18.64 %    Family Pre 24 %    GLOBAL Pre 21.44 %            Scores of 19 and below usually indicate a poorer quality of life in these areas.  A difference of  2-3 points is a clinically meaningful difference.  A difference of 2-3 points in the total score of the Quality of Life Index has been associated with significant improvement in overall quality of life, self-image, physical symptoms, and general health in studies assessing change in quality of life.  PHQ-9: Review Flowsheet       02/17/2023 10/07/2021 02/01/2019  Depression screen PHQ 2/9  Decreased Interest 0 0 0  Down, Depressed, Hopeless 0 1 0  PHQ - 2 Score 0 1 0  Altered sleeping 0 0 -  Tired, decreased energy 1 0 -  Change in appetite 0 0 -  Feeling bad or failure about yourself  0 1 -  Trouble concentrating 0 0 -  Moving slowly or fidgety/restless 0 0 -  Suicidal thoughts 0 0 -  PHQ-9 Score 1 2 -  Difficult doing work/chores Not difficult at all Not difficult at all -   Interpretation of Total Score  Total Score Depression Severity:  1-4 = Minimal depression, 5-9 = Mild depression, 10-14 = Moderate depression, 15-19 = Moderately severe depression, 20-27 = Severe depression   Psychosocial Evaluation and Intervention:   Psychosocial Re-Evaluation:   Psychosocial Discharge (Final Psychosocial Re-Evaluation):   Vocational Rehabilitation: Provide vocational rehab assistance to qualifying candidates.    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 Pt has no vocational needs at this time.             Education: Education Goals: Education classes will be provided on a weekly basis, covering required topics. Participant will state understanding/return demonstration of topics presented.     Core Videos: Exercise    Move It!  Clinical staff conducted group or individual video education with verbal and written material and guidebook.  Patient learns the recommended Pritikin exercise program. Exercise with the goal of living a long, healthy life. Some of the health benefits of exercise include controlled diabetes, healthier blood pressure levels, improved cholesterol levels, improved heart and lung capacity, improved sleep, and better body composition. Everyone should speak with their doctor before starting or changing an exercise routine.  Biomechanical Limitations Clinical staff conducted group or individual video education with verbal and written material and guidebook.  Patient learns how biomechanical limitations can impact exercise and how we can mitigate and possibly overcome limitations to have an impactful and balanced exercise routine.  Body Composition Clinical staff conducted group or individual video education with verbal and written material and guidebook.  Patient learns that body composition (ratio of muscle mass to fat mass) is a key component to assessing overall fitness,  rather than body weight alone. Increased fat mass, especially visceral belly fat, can put [place] at increased risk for metabolic syndrome, type 2 diabetes, heart disease, and even death. It is recommended to combine diet and exercise (cardiovascular and resistance training) to improve  your body composition. Seek guidance from your physician and exercise physiologist before implementing an exercise routine.  Exercise Action Plan Clinical staff conducted group or individual video education with verbal and written material and guidebook.  Patient learns the recommended strategies to achieve and enjoy long-term exercise adherence, including variety, self-motivation, self-efficacy, and positive decision making. Benefits of exercise include fitness, good health, weight management, more energy, better sleep, less stress, and overall well-being.  Medical   Heart Disease Risk Reduction Clinical staff conducted group or individual video education with verbal and written material and guidebook.  Patient learns our heart is our most vital organ as it circulates oxygen, nutrients, white blood cells, and hormones throughout the entire body, and carries waste away. Data supports a plant-based eating plan like the Pritikin Program for its effectiveness in slowing progression of and reversing heart disease. The video provides a number of recommendations to address heart disease.   Metabolic Syndrome and Belly Fat  Clinical staff conducted group or individual video education with verbal and written material and guidebook.  Patient learns what metabolic syndrome is, how it leads to heart disease, and how one can reverse it and keep it from coming back. You have metabolic syndrome if you have 3 of the following 5 criteria: abdominal obesity, high blood pressure, high triglycerides, low HDL cholesterol, and high blood sugar.  Hypertension and Heart Disease Clinical staff conducted group or individual video education with verbal and written material and guidebook.  Patient learns that high blood pressure, or hypertension, is very common in the [place]. Hypertension is largely due to excessive salt intake, but other important risk factors include being overweight, physical inactivity, drinking  too much alcohol, smoking, and not eating enough potassium from fruits and vegetables. High blood pressure is a leading risk factor for heart attack, stroke, congestive heart failure, dementia, kidney failure, and premature death. Long-term effects of excessive salt intake include stiffening of the arteries and thickening of heart muscle and organ damage. Recommendations include ways to reduce hypertension and the risk of heart disease.  Diseases of Our Time - Focusing on Diabetes Clinical staff conducted group or individual video education with verbal and written material and guidebook.  Patient learns why the best way to stop diseases of our time is prevention, through food and other lifestyle changes. Medicine (such as prescription pills and surgeries) is often only a Band-Aid on the problem, not a long-term solution. Most common diseases of our time include obesity, type 2 diabetes, hypertension, heart disease, and cancer. The Pritikin Program is recommended and has been proven to help reduce, reverse, and/or prevent the damaging effects of metabolic syndrome.  Nutrition   Overview of the Pritikin Eating Plan  Clinical staff conducted group or individual video education with verbal and written material and guidebook.  Patient learns about the [place] for disease risk reduction. The [place] emphasizes a wide variety of unrefined, minimally-processed carbohydrates, like fruits, vegetables, whole grains, and legumes. Go, Caution, and Stop food choices are explained. Plant-based and lean animal proteins are emphasized. Rationale provided for low sodium intake for blood pressure control, low added sugars for blood sugar stabilization, and low added fats and oils for coronary artery disease risk reduction and  weight management.  Calorie Density  Clinical staff conducted group or individual video education with verbal and written material and guidebook.  Patient learns about  calorie density and how it impacts the Pritikin Eating Plan. Knowing the characteristics of the food you choose will help you decide whether those foods will lead to weight gain or weight loss, and whether you want to consume more or less of them. Weight loss is usually a side effect of the Pritikin Eating Plan because of its focus on low calorie-dense foods.  Label Reading  Clinical staff conducted group or individual video education with verbal and written material and guidebook.  Patient learns about the Pritikin recommended label reading guidelines and corresponding recommendations regarding calorie density, added sugars, sodium content, and whole grains.  Dining Out - Part 1  Clinical staff conducted group or individual video education with verbal and written material and guidebook.  Patient learns that restaurant meals can be sabotaging because they can be so high in calories, fat, sodium, and/or sugar. Patient learns recommended strategies on how to positively address this and avoid unhealthy pitfalls.  Facts on Fats  Clinical staff conducted group or individual video education with verbal and written material and guidebook.  Patient learns that lifestyle modifications can be just as effective, if not more so, as many medications for lowering your risk of heart disease. A Pritikin lifestyle can help to reduce your risk of inflammation and atherosclerosis (cholesterol build-up, or plaque, in the artery walls). Lifestyle interventions such as dietary choices and physical activity address the cause of atherosclerosis. A review of the types of fats and their impact on blood cholesterol levels, along with dietary recommendations to reduce fat intake is also included.  Nutrition Action Plan  Clinical staff conducted group or individual video education with verbal and written material and guidebook.  Patient learns how to incorporate Pritikin recommendations into their lifestyle. Recommendations  include planning and keeping personal health goals in mind as an important part of their success.  Healthy Mind-Set    Healthy Minds, Bodies, Hearts  Clinical staff conducted group or individual video education with verbal and written material and guidebook.  Patient learns how to identify when they are stressed. Video will discuss the impact of that stress, as well as the many benefits of stress management. Patient will also be introduced to stress management techniques. The way we think, act, and feel has an impact on our hearts.  How Our Thoughts Can Heal Our Hearts  Clinical staff conducted group or individual video education with verbal and written material and guidebook.  Patient learns that negative thoughts can cause depression and anxiety. This can result in negative lifestyle behavior and serious health problems. Cognitive behavioral therapy is an effective method to help control our thoughts in order to change and improve our emotional outlook.  Additional Videos:  Exercise    Improving Performance  Clinical staff conducted group or individual video education with verbal and written material and guidebook.  Patient learns to use a non-linear approach by alternating intensity levels and lengths of time spent exercising to help burn more calories and lose more body fat. Cardiovascular exercise helps improve heart health, metabolism, hormonal balance, blood sugar control, and recovery from fatigue. Resistance training improves strength, endurance, balance, coordination, reaction time, metabolism, and muscle mass. Flexibility exercise improves circulation, posture, and balance. Seek guidance from your physician and exercise physiologist before implementing an exercise routine and learn your capabilities and proper form for all exercise.  Introduction  to Yoga  Clinical staff conducted group or individual video education with verbal and written material and guidebook.  Patient learns about  yoga, a discipline of the coming together of mind, breath, and body. The benefits of yoga include improved flexibility, improved range of motion, better posture and core strength, increased lung function, weight loss, and positive self-image. Yoga's heart health benefits include lowered blood pressure, healthier heart rate, decreased cholesterol and triglyceride levels, improved immune function, and reduced stress. Seek guidance from your physician and exercise physiologist before implementing an exercise routine and learn your capabilities and proper form for all exercise.  Medical   Aging: Enhancing Your Quality of Life  Clinical staff conducted group or individual video education with verbal and written material and guidebook.  Patient learns key strategies and recommendations to stay in good physical health and enhance quality of life, such as prevention strategies, having an advocate, securing a [place], and keeping a list of medications and system for tracking them. It also discusses how to avoid risk for bone loss.  Biology of Weight Control  Clinical staff conducted group or individual video education with verbal and written material and guidebook.  Patient learns that weight gain occurs because we consume more calories than we burn (eating more, moving less). Even if your body weight is normal, you may have higher ratios of fat compared to muscle mass. Too much body fat puts you at increased risk for cardiovascular disease, heart attack, stroke, type 2 diabetes, and obesity-related cancers. In addition to exercise, following the [place] can help reduce your risk.  Decoding Lab Results  Clinical staff conducted group or individual video education with verbal and written material and guidebook.  Patient learns that lab test reflects one measurement whose values change over time and are influenced by many factors, including medication, stress, sleep,  exercise, food, hydration, pre-existing medical conditions, and more. It is recommended to use the knowledge from this video to become more involved with your lab results and evaluate your numbers to speak with your doctor.   Diseases of Our Time - Overview  Clinical staff conducted group or individual video education with verbal and written material and guidebook.  Patient learns that according to the CDC, 50% to 70% of chronic diseases (such as obesity, type 2 diabetes, elevated lipids, hypertension, and heart disease) are avoidable through lifestyle improvements including healthier food choices, listening to satiety cues, and increased physical activity.  Sleep Disorders Clinical staff conducted group or individual video education with verbal and written material and guidebook.  Patient learns how good quality and duration of sleep are important to overall health and well-being. Patient also learns about sleep disorders and how they impact health along with recommendations to address them, including discussing with a physician.  Nutrition  Dining Out - Part 2 Clinical staff conducted group or individual video education with verbal and written material and guidebook.  Patient learns how to plan ahead and communicate in order to maximize their dining experience in a healthy and nutritious manner. Included are recommended food choices based on the type of restaurant the patient is visiting.   Fueling a Best boy conducted group or individual video education with verbal and written material and guidebook.  There is a strong connection between our food choices and our health. Diseases like obesity and type 2 diabetes are very prevalent and are in large-part due to lifestyle choices. The Pritikin Eating Plan provides  plenty of food and hunger-curbing satisfaction. It is easy to follow, affordable, and helps reduce health risks.  Menu Workshop  Clinical staff conducted group or  individual video education with verbal and written material and guidebook.  Patient learns that restaurant meals can sabotage health goals because they are often packed with calories, fat, sodium, and sugar. Recommendations include strategies to plan ahead and to communicate with the manager, chef, or server to help order a healthier meal.  Planning Your Eating Strategy  Clinical staff conducted group or individual video education with verbal and written material and guidebook.  Patient learns about the [place] and its benefit of reducing the risk of disease. The [place] does not focus on calories. Instead, it emphasizes high-quality, nutrient-rich foods. By knowing the characteristics of the foods, we choose, we can determine their calorie density and make informed decisions.  Targeting Your Nutrition Priorities  Clinical staff conducted group or individual video education with verbal and written material and guidebook.  Patient learns that lifestyle habits have a tremendous impact on disease risk and progression. This video provides eating and physical activity recommendations based on your personal health goals, such as reducing LDL cholesterol, losing weight, preventing or controlling type 2 diabetes, and reducing high blood pressure.  Vitamins and Minerals  Clinical staff conducted group or individual video education with verbal and written material and guidebook.  Patient learns different ways to obtain key vitamins and minerals, including through a recommended healthy diet. It is important to discuss all supplements you take with your doctor.   Healthy Mind-Set    Smoking Cessation  Clinical staff conducted group or individual video education with verbal and written material and guidebook.  Patient learns that cigarette smoking and tobacco addiction pose a serious health risk which affects millions of people. Stopping smoking will significantly reduce the risk of  heart disease, lung disease, and many forms of cancer. Recommended strategies for quitting are covered, including working with your doctor to develop a successful plan.  Culinary   Becoming a Financial trader conducted group or individual video education with verbal and written material and guidebook.  Patient learns that cooking at home can be healthy, cost-effective, quick, and puts them in control. Keys to cooking healthy recipes will include looking at your recipe, assessing your equipment needs, planning ahead, making it simple, choosing cost-effective seasonal ingredients, and limiting the use of added fats, salts, and sugars.  Cooking - Breakfast and Snacks  Clinical staff conducted group or individual video education with verbal and written material and guidebook.  Patient learns how important breakfast is to satiety and nutrition through the entire day. Recommendations include key foods to eat during breakfast to help stabilize blood sugar levels and to prevent overeating at meals later in the day. Planning ahead is also a key component.  Cooking - Human resources officer conducted group or individual video education with verbal and written material and guidebook.  Patient learns eating strategies to improve overall health, including an approach to cook more at home. Recommendations include thinking of animal protein as a side on your plate rather than center stage and focusing instead on lower calorie dense options like vegetables, fruits, whole grains, and plant-based proteins, such as beans. Making sauces in large quantities to freeze for later and leaving the skin on your vegetables are also recommended to maximize your experience.  Cooking - Healthy Salads and Dressing Clinical staff conducted group or individual video  education with verbal and written material and guidebook.  Patient learns that vegetables, fruits, whole grains, and legumes are the foundations of  the [place]. Recommendations include how to incorporate each of these in flavorful and healthy salads, and how to create homemade salad dressings. Proper handling of ingredients is also covered. Cooking - Soups and [employer] - Soups and Desserts Clinical staff conducted group or individual video education with verbal and written material and guidebook.  Patient learns that Pritikin soups and desserts make for easy, nutritious, and delicious snacks and meal components that are low in sodium, fat, sugar, and calorie density, while high in vitamins, minerals, and filling fiber. Recommendations include simple and healthy ideas for soups and desserts.   Overview     The Pritikin Solution Program Overview Clinical staff conducted group or individual video education with verbal and written material and guidebook.  Patient learns that the results of the [place] Program have been documented in more than 100 articles published in peer-reviewed journals, and the benefits include reducing risk factors for (and, in some cases, even reversing) high cholesterol, high blood pressure, type 2 diabetes, obesity, and more! An overview of the three key pillars of the Pritikin Program will be covered: eating well, doing regular exercise, and having a healthy mind-set.  WORKSHOPS  Exercise: Exercise Basics: Building Your Action Plan Clinical staff led group instruction and group discussion with PowerPoint presentation and patient guidebook. To enhance the learning environment the use of posters, models and videos may be added. At the conclusion of this workshop, patients will comprehend the difference between physical activity and exercise, as well as the benefits of incorporating both, into their routine. Patients will understand the FITT (Frequency, Intensity, Time, and Type) principle and how to use it to build an exercise action plan. In addition, safety concerns and other considerations for  exercise and cardiac rehab will be addressed by the presenter. The purpose of this lesson is to promote a comprehensive and effective weekly exercise routine in order to improve patients' overall level of fitness.   Managing Heart Disease: Your Path to a Healthier Heart Clinical staff led group instruction and group discussion with PowerPoint presentation and patient guidebook. To enhance the learning environment the use of posters, models and videos may be added.At the conclusion of this workshop, patients will understand the anatomy and physiology of the heart. Additionally, they will understand how Pritikin's three pillars impact the risk factors, the progression, and the management of heart disease.  The purpose of this lesson is to provide a high-level overview of the heart, heart disease, and how the Pritikin lifestyle positively impacts risk factors.  Exercise Biomechanics Clinical staff led group instruction and group discussion with PowerPoint presentation and patient guidebook. To enhance the learning environment the use of posters, models and videos may be added. Patients will learn how the structural parts of their bodies function and how these functions impact their daily activities, movement, and exercise. Patients will learn how to promote a neutral spine, learn how to manage pain, and identify ways to improve their physical movement in order to promote healthy living. The purpose of this lesson is to expose patients to common physical limitations that impact physical activity. Participants will learn practical ways to adapt and manage aches and pains, and to minimize their effect on regular exercise. Patients will learn how to maintain good posture while sitting, walking, and lifting.  Balance Training and Fall Prevention  Clinical staff led group instruction  and group discussion with PowerPoint presentation and patient guidebook. To enhance the learning environment the use of  posters, models and videos may be added. At the conclusion of this workshop, patients will understand the importance of their sensorimotor skills (vision, proprioception, and the vestibular system) in maintaining their ability to balance as they age. Patients will apply a variety of balancing exercises that are appropriate for their current level of function. Patients will understand the common causes for poor balance, possible solutions to these problems, and ways to modify their physical environment in order to minimize their fall risk. The purpose of this lesson is to teach patients about the importance of maintaining balance as they age and ways to minimize their risk of falling.  WORKSHOPS   Nutrition:  Fueling a Scientist, research (physical sciences) led group instruction and group discussion with PowerPoint presentation and patient guidebook. To enhance the learning environment the use of posters, models and videos may be added. Patients will review the foundational principles of the [place] and understand what constitutes a serving size in each of the food groups. Patients will also learn Pritikin-friendly foods that are better choices when away from home and review make-ahead meal and snack options. Calorie density will be reviewed and applied to three nutrition priorities: weight maintenance, weight loss, and weight gain. The purpose of this lesson is to reinforce (in a group setting) the key concepts around what patients are recommended to eat and how to apply these guidelines when away from home by planning and selecting Pritikin-friendly options. Patients will understand how calorie density may be adjusted for different weight management goals.  Mindful Eating  Clinical staff led group instruction and group discussion with PowerPoint presentation and patient guidebook. To enhance the learning environment the use of posters, models and videos may be added. Patients will briefly review the  concepts of the [place] and the importance of low-calorie dense foods. The concept of mindful eating will be introduced as well as the importance of paying attention to internal hunger signals. Triggers for non-hunger eating and techniques for dealing with triggers will be explored. The purpose of this lesson is to provide patients with the opportunity to review the basic principles of the Energy, discuss the value of eating mindfully and how to measure internal cues of hunger and fullness using the Hunger Scale. Patients will also discuss reasons for non-hunger eating and learn strategies to use for controlling emotional eating.  Targeting Your Nutrition Priorities Clinical staff led group instruction and group discussion with PowerPoint presentation and patient guidebook. To enhance the learning environment the use of posters, models and videos may be added. Patients will learn how to determine their genetic susceptibility to disease by reviewing their family history. Patients will gain insight into the importance of diet as part of an overall healthy lifestyle in mitigating the impact of genetics and other environmental insults. The purpose of this lesson is to provide patients with the opportunity to assess their personal nutrition priorities by looking at their family history, their own health history and current risk factors. Patients will also be able to discuss ways of prioritizing and modifying the [place] for their highest risk areas  Menu  Clinical staff led group instruction and group discussion with PowerPoint presentation and patient guidebook. To enhance the learning environment the use of posters, models and videos may be added. Using menus brought in from [employer], or printed from [employer], patients will apply  the Pritikin dining out guidelines that were presented in the [employer] video. Patients will also be able to  practice these guidelines in a variety of provided scenarios. The purpose of this lesson is to provide patients with the opportunity to practice hands-on learning of the [place] with actual menus and practice scenarios.  Label Reading Clinical staff led group instruction and group discussion with PowerPoint presentation and patient guidebook. To enhance the learning environment the use of posters, models and videos may be added. Patients will review and discuss the Pritikin label reading guidelines presented in Pritikin's Label Reading Educational series video. Using fool labels brought in from local grocery stores and markets, patients will apply the label reading guidelines and determine if the packaged food meet the Pritikin guidelines. The purpose of this lesson is to provide patients with the opportunity to review, discuss, and practice hands-on learning of the Pritikin Label Reading guidelines with actual packaged food labels. [place] Workshops are designed to teach patients ways to prepare quick, simple, and affordable recipes at home. The importance of nutrition's role in chronic disease risk reduction is reflected in its emphasis in the overall Pritikin program. By learning how to prepare essential core Pritikin Eating Plan recipes, patients will increase control over what they eat; be able to customize the flavor of foods without the use of added salt, sugar, or fat; and improve the quality of the food they consume. By learning a set of core recipes which are easily assembled, quickly prepared, and affordable, patients are more likely to prepare more healthy foods at home. These workshops focus on convenient breakfasts, simple entres, side dishes, and desserts which can be prepared with minimal effort and are consistent with nutrition recommendations for cardiovascular risk reduction. Cooking [employer] are taught by a Teacher, music (RD) who has been trained by the [employer]. The chef or RD has a clear understanding of the importance of minimizing - if not completely eliminating - added fat, sugar, and sodium in recipes. Throughout the series of [place] Workshop sessions, patients will learn about healthy ingredients and efficient methods of cooking to build confidence in their capability to prepare    Cooking School weekly topics:  Adding Flavor- Sodium-Free  Fast and Healthy Breakfasts  Powerhouse Plant-Based Proteins  Satisfying Salads and Dressings  Simple Sides and Sauces  International Cuisine-Spotlight on the [place] Zones  Delicious Desserts  Savory Soups  Efficiency Cooking - Meals in a Snap  Tasty Appetizers and Snacks  Comforting Weekend Breakfasts  One-Pot Wonders   Fast Evening Meals  Easy [place] (Psychosocial): New Thoughts, New Behaviors Clinical staff led group instruction and group discussion with PowerPoint presentation and patient guidebook. To enhance the learning environment the use of posters, models and videos may be added. Patients will learn and practice techniques for developing effective health and lifestyle goals. Patients will be able to effectively apply the goal setting process learned to develop at least one new personal goal.  The purpose of this lesson is to expose patients to a new skill set of behavior modification techniques such as techniques setting SMART goals, overcoming barriers, and achieving new thoughts and new behaviors.  Managing Moods and Relationships Clinical staff led group instruction and group discussion with PowerPoint presentation and patient guidebook. To enhance the learning environment the use of posters, models and videos may  be added. Patients will learn how emotional and chronic stress factors can impact their health and relationships. They will learn  healthy ways to manage their moods and utilize positive coping mechanisms. In addition, ICR patients will learn ways to improve communication skills. The purpose of this lesson is to expose patients to ways of understanding how one's mood and health are intimately connected. Developing a healthy outlook can help build positive relationships and connections with others. Patients will understand the importance of utilizing effective communication skills that include actively listening and being heard. They will learn and understand the importance of the "4 Cs" and especially Connections in fostering of a Healthy Mind-Set.  Healthy Sleep for a Healthy Heart Clinical staff led group instruction and group discussion with PowerPoint presentation and patient guidebook. To enhance the learning environment the use of posters, models and videos may be added. At the conclusion of this workshop, patients will be able to demonstrate knowledge of the importance of sleep to overall health, well-being, and quality of life. They will understand the symptoms of, and treatments for, common sleep disorders. Patients will also be able to identify daytime and nighttime behaviors which impact sleep, and they will be able to apply these tools to help manage sleep-related challenges. The purpose of this lesson is to provide patients with a general overview of sleep and outline the importance of quality sleep. Patients will learn about a few of the most common sleep disorders. Patients will also be introduced to the concept of "sleep hygiene," and discover ways to self-manage certain sleeping problems through simple daily behavior changes. Finally, the workshop will motivate patients by clarifying the links between quality sleep and their goals of heart-healthy living.   Recognizing and Reducing Stress Clinical staff led group instruction and group discussion with PowerPoint presentation and patient guidebook. To enhance the learning  environment the use of posters, models and videos may be added. At the conclusion of this workshop, patients will be able to understand the types of stress reactions, differentiate between acute and chronic stress, and recognize the impact that chronic stress has on their health. They will also be able to apply different coping mechanisms, such as reframing negative self-talk. Patients will have the opportunity to practice a variety of stress management techniques, such as deep abdominal breathing, progressive muscle relaxation, and/or guided imagery.  The purpose of this lesson is to educate patients on the role of stress in their lives and to provide healthy techniques for coping with it.  Learning Barriers/Preferences:  Learning Barriers/Preferences - 02/17/23 1324       Learning Barriers/Preferences   Learning Barriers Sight   wears contacts   Learning Preferences Video;Computer/Internet;Group Instruction;Individual Instruction;Pictoral;Skilled Demonstration             Education Topics:  Knowledge Questionnaire Score:  Knowledge Questionnaire Score - 02/17/23 1325       Knowledge Questionnaire Score   Pre Score 22/24             Core Components/Risk Factors/Patient Goals at Admission:  Personal Goals and Risk Factors at Admission - 02/17/23 1326       Core Components/Risk Factors/Patient Goals on Admission    Weight Management Yes;Obesity;Weight Loss    Intervention Weight Management: Develop a combined nutrition and exercise program designed to reach desired caloric intake, while maintaining appropriate intake of nutrient and fiber, sodium and fats, and appropriate energy expenditure required for the weight goal.;Weight Management: Provide education and appropriate resources to help participant work on  and attain dietary goals.;Weight Management/Obesity: Establish reasonable short term and long term weight goals.    Admit Weight 240 lb 15.4 oz (109.3 kg)    Goal Weight:  Long Term 190 lb (86.2 kg)    Expected Outcomes Short Term: Continue to assess and modify interventions until short term weight is achieved;Long Term: Adherence to nutrition and physical activity/exercise program aimed toward attainment of established weight goal;Weight Maintenance: Understanding of the daily nutrition guidelines, which includes 25-35% calories from fat, 7% or less cal from saturated fats, less than '200mg'$  cholesterol, less than 1.5gm of sodium, & 5 or more servings of fruits and vegetables daily;Weight Loss: Understanding of general recommendations for a balanced deficit meal plan, which promotes 1-2 lb weight loss per week and includes a negative energy balance of [phone] kcal/d;Understanding recommendations for meals to include 15-35% energy as protein, 25-35% energy from fat, 35-60% energy from carbohydrates, less than '200mg'$  of dietary cholesterol, 20-35 gm of total fiber daily;Understanding of distribution of calorie intake throughout the day with the consumption of 4-5 meals/snacks    Diabetes Yes    Intervention Provide education about signs/symptoms and action to take for hypo/hyperglycemia.;Provide education about proper nutrition, including hydration, and aerobic/resistive exercise prescription along with prescribed medications to achieve blood glucose in normal ranges: Fasting glucose 65-99 mg/dL    Expected Outcomes Short Term: Participant verbalizes understanding of the signs/symptoms and immediate care of hyper/hypoglycemia, proper foot care and importance of medication, aerobic/resistive exercise and nutrition plan for blood glucose control.;Long Term: Attainment of HbA1C < 7%.    Hypertension Yes    Intervention Provide education on lifestyle modifcations including regular physical activity/exercise, weight management, moderate sodium restriction and increased consumption of fresh fruit, vegetables, and low fat dairy, alcohol moderation, and smoking cessation.;Monitor  prescription use compliance.    Expected Outcomes Short Term: Continued assessment and intervention until BP is < 140/32m HG in hypertensive participants. < 130/859mHG in hypertensive participants with diabetes, heart failure or chronic kidney disease.;Long Term: Maintenance of blood pressure at goal levels.    Lipids Yes    Intervention Provide education and support for participant on nutrition & aerobic/resistive exercise along with prescribed medications to achieve LDL '70mg'$ , HDL >'40mg'$ .    Expected Outcomes Short Term: Participant states understanding of desired cholesterol values and is compliant with medications prescribed. Participant is following exercise prescription and nutrition guidelines.;Long Term: Cholesterol controlled with medications as prescribed, with individualized exercise RX and with personalized nutrition plan. Value goals: LDL < '70mg'$ , HDL > 40 mg.             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 Introduction to [facility] education program/ intensive cardiac rehab. Intitial orientation packet reviewed with patient                Comments: Participant attended orientation for the cardiac rehabilitation program on  02/17/2023  to perform initial intake and exercise walk test. Patient introduced to the [facility] and orientation packet was reviewed. Completed 6-minute walk test, measurements, initial ITP, and exercise prescription. Vital signs stable. Telemetry-normal sinus rhythm, 1st degree AVB, asymptomatic.   Service time was from 8:00 to 9:47.

## 2023-02-17 NOTE — Progress Notes (Signed)
Cardiac Rehab Medication Review   Does the patient  feel that his/her medications are working for him/her?  yes  Has the patient been experiencing any side effects to the medications prescribed?  no  Does the patient measure his/her own blood pressure or blood glucose at home?  yes   Does the patient have any problems obtaining medications due to transportation or finances?   no  Understanding of regimen: excellent Understanding of indications: excellent Potential of compliance: excellent    Comments: Pt checks his blood sugars in the am. Pt does not check blood pressures regularly.  Pt has a good understanding of his medications and their purpose.     Lesly Rubenstein 02/17/2023 1:31 PM

## 2023-02-21 ENCOUNTER — Encounter (HOSPITAL_COMMUNITY)
Admission: RE | Admit: 2023-02-21 | Discharge: 2023-02-21 | Disposition: A | Discharge status: Home / Self Care | Payer: BC Managed Care – PPO | Source: Ambulatory Visit | Attending: Internal Medicine | Admitting: Internal Medicine

## 2023-02-21 DIAGNOSIS — Z95828 Presence of other vascular implants and grafts: Secondary | ICD-10-CM

## 2023-02-21 DIAGNOSIS — I7121 Aneurysm of the ascending aorta, without rupture: Secondary | ICD-10-CM | POA: Diagnosis not present

## 2023-02-21 DIAGNOSIS — Z5189 Encounter for other specified aftercare: Secondary | ICD-10-CM | POA: Diagnosis not present

## 2023-02-21 DIAGNOSIS — Z952 Presence of prosthetic heart valve: Secondary | ICD-10-CM

## 2023-02-21 LAB — GLUCOSE, CAPILLARY
Glucose-Capillary: 118 mg/dL — ABNORMAL HIGH (ref 70–99)
Glucose-Capillary: 105 mg/dL — ABNORMAL HIGH (ref 70–99)

## 2023-02-21 NOTE — Progress Notes (Signed)
Daily Session Note  Patient Details  Name: Douglas Phillips MRN: [id number] Date of Birth: [birth year] Referring Provider:   Flowsheet Row INTENSIVE CARDIAC REHAB ORIENT from 02/17/2023 in [facility] [place]  Referring Provider Phineas Inches, MD       Encounter Date: 02/21/2023  Check In:  Session Check In - 02/21/23 [id number]       Check-In   Supervising physician immediately available to respond to emergencies [facility] MD immediately available    Physician(s) Ambrose Pancoast, NP    Location [facility]    Staff Present Sandy Salaam, MS, Exercise Physiologist;Jetta Gilford Rile BS, ACSM-CEP, Exercise Physiologist;Johnny Starleen Blue, MS, Exercise Physiologist;Toniette Devera Rosezella [place], RN, MHA    Virtual Visit No    Medication changes reported     No    Fall or balance concerns reported    No    Tobacco Cessation No Change    Warm-up and Cool-down Performed as group-led instruction    Resistance Training Performed Yes    VAD Patient? No    PAD/SET Patient? No      Pain Assessment   Currently in Pain? No/denies    Pain Score 0-No pain    Multiple Pain Sites No             Capillary Blood Glucose: Results for orders placed or performed during the hospital encounter of 02/21/23 (from the past 24 hour(s))  Glucose, capillary     Status: Abnormal   Collection Time: 02/21/23  7:05 AM  Result Value Ref Range   Glucose-Capillary 118 (H) 70 - 99 mg/dL  Glucose, capillary     Status: Abnormal   Collection Time: 02/21/23  8:06 AM  Result Value Ref Range   Glucose-Capillary 105 (H) 70 - 99 mg/dL     Exercise Prescription Changes - 02/21/23 0829       Response to Exercise   Blood Pressure (Admit) 130/64    Blood Pressure (Exercise) 140/70    Blood Pressure (Exit) 134/66    Heart Rate (Admit) 84 bpm    Heart Rate (Exercise) 90 bpm    Heart Rate (Exit) 66 bpm    Rating of Perceived Exertion (Exercise) 12.5    Perceived Dyspnea  (Exercise) 0    Symptoms 0    Comments Pr first day in the [facility] ICR CRP2 program    Duration Progress to 30 minutes of  aerobic without signs/symptoms of physical distress    Intensity THRR unchanged      Progression   Progression Continue to progress workloads to maintain intensity without signs/symptoms of physical distress.    Average METs 3.47      Resistance Training   Training Prescription Yes    Weight 4 lbs    Reps 10-15    Time 10 Minutes      Treadmill   MPH 2.7    Grade 1    Minutes 15    METs 3.44      Recumbant Bike   Level 2    RPM 77    Watts 35    Minutes 15    METs 3.5             Social History   Tobacco Use  Smoking Status Never  Smokeless Tobacco Never    Goals Met:  Independence with exercise equipment Exercise tolerated well No report of concerns or symptoms today Strength training completed today  Goals Unmet:     Comments:  Pt started the group cardiac rehab sessions today. Tolerated light exercise without difficulty. VSS, telemetry-Sinus rhythm with 1st degree A-V block, asymptomatic. Patient reported no medication changes since reviewed at initial evaluation on 02/17/23. Pt denies barriers to medication compliance.  PSYCHOSOCIAL ASSESSMENT:  PHQ-9 score =1. Pt exhibits positive coping skills, hopeful outlook with supportive family. No psychosocial needs identified at this time, no psychosocial interventions necessary.  Pt oriented to exercise equipment and routine.  Understanding verbalized.    Dr. Fransico Him is Medical Director for Cardiac Rehab at [facility].

## 2023-02-23 ENCOUNTER — Encounter (HOSPITAL_COMMUNITY)
Admission: RE | Admit: 2023-02-23 | Discharge: 2023-02-23 | Disposition: A | Discharge status: Home / Self Care | Payer: BC Managed Care – PPO | Source: Ambulatory Visit | Attending: Internal Medicine | Admitting: Internal Medicine

## 2023-02-23 DIAGNOSIS — Z95828 Presence of other vascular implants and grafts: Secondary | ICD-10-CM

## 2023-02-23 DIAGNOSIS — Z952 Presence of prosthetic heart valve: Secondary | ICD-10-CM

## 2023-02-23 DIAGNOSIS — I7121 Aneurysm of the ascending aorta, without rupture: Secondary | ICD-10-CM | POA: Diagnosis not present

## 2023-02-23 DIAGNOSIS — Z5189 Encounter for other specified aftercare: Secondary | ICD-10-CM | POA: Diagnosis not present

## 2023-02-23 LAB — GLUCOSE, CAPILLARY
Glucose-Capillary: 129 mg/dL — ABNORMAL HIGH (ref 70–99)
Glucose-Capillary: 99 mg/dL (ref 70–99)

## 2023-02-25 ENCOUNTER — Ambulatory Visit: Payer: BC Managed Care – PPO | Attending: Cardiovascular Disease | Admitting: *Deleted

## 2023-02-25 ENCOUNTER — Encounter (HOSPITAL_COMMUNITY)
Admission: RE | Admit: 2023-02-25 | Discharge: 2023-02-25 | Disposition: A | Discharge status: Home / Self Care | Payer: BC Managed Care – PPO | Source: Ambulatory Visit | Attending: Internal Medicine | Admitting: Internal Medicine

## 2023-02-25 DIAGNOSIS — I7121 Aneurysm of the ascending aorta, without rupture: Secondary | ICD-10-CM | POA: Diagnosis not present

## 2023-02-25 DIAGNOSIS — I3139 Other pericardial effusion (noninflammatory): Secondary | ICD-10-CM | POA: Diagnosis not present

## 2023-02-25 DIAGNOSIS — Z952 Presence of prosthetic heart valve: Secondary | ICD-10-CM

## 2023-02-25 DIAGNOSIS — Z95828 Presence of other vascular implants and grafts: Secondary | ICD-10-CM

## 2023-02-25 DIAGNOSIS — Z5189 Encounter for other specified aftercare: Secondary | ICD-10-CM | POA: Diagnosis not present

## 2023-02-25 LAB — POCT INR: POC INR: 1.7

## 2023-02-25 NOTE — Patient Instructions (Addendum)
Description   Today  take 1.5 tablets of warfarin  and then continue taking warfarin 1 tablet daily. Stay consistent with greens each. Recheck INR in 2 weeks.  Anticoagulation Clinic [phone]

## 2023-02-28 ENCOUNTER — Encounter (HOSPITAL_COMMUNITY)
Admission: RE | Admit: 2023-02-28 | Discharge: 2023-02-28 | Disposition: A | Discharge status: Home / Self Care | Payer: BC Managed Care – PPO | Source: Ambulatory Visit | Attending: Internal Medicine | Admitting: Internal Medicine

## 2023-02-28 DIAGNOSIS — I7121 Aneurysm of the ascending aorta, without rupture: Secondary | ICD-10-CM | POA: Diagnosis not present

## 2023-02-28 DIAGNOSIS — Z952 Presence of prosthetic heart valve: Secondary | ICD-10-CM

## 2023-02-28 DIAGNOSIS — Z95828 Presence of other vascular implants and grafts: Secondary | ICD-10-CM

## 2023-02-28 DIAGNOSIS — E119 Type 2 diabetes mellitus without complications: Secondary | ICD-10-CM | POA: Diagnosis not present

## 2023-02-28 DIAGNOSIS — E291 Testicular hypofunction: Secondary | ICD-10-CM | POA: Diagnosis not present

## 2023-02-28 DIAGNOSIS — E7800 Pure hypercholesterolemia, unspecified: Secondary | ICD-10-CM | POA: Diagnosis not present

## 2023-02-28 DIAGNOSIS — Z5189 Encounter for other specified aftercare: Secondary | ICD-10-CM | POA: Diagnosis not present

## 2023-02-28 DIAGNOSIS — E559 Vitamin D deficiency, unspecified: Secondary | ICD-10-CM | POA: Diagnosis not present

## 2023-03-01 DIAGNOSIS — E559 Vitamin D deficiency, unspecified: Secondary | ICD-10-CM | POA: Diagnosis not present

## 2023-03-01 DIAGNOSIS — E291 Testicular hypofunction: Secondary | ICD-10-CM | POA: Diagnosis not present

## 2023-03-01 DIAGNOSIS — Z79899 Other long term (current) drug therapy: Secondary | ICD-10-CM | POA: Diagnosis not present

## 2023-03-01 DIAGNOSIS — E7800 Pure hypercholesterolemia, unspecified: Secondary | ICD-10-CM | POA: Diagnosis not present

## 2023-03-01 DIAGNOSIS — E119 Type 2 diabetes mellitus without complications: Secondary | ICD-10-CM | POA: Diagnosis not present

## 2023-03-01 DIAGNOSIS — Z125 Encounter for screening for malignant neoplasm of prostate: Secondary | ICD-10-CM | POA: Diagnosis not present

## 2023-03-01 NOTE — Progress Notes (Signed)
Cardiac Individual Treatment Plan  Patient Details  Name: Douglas Phillips MRN: [id number] Date of Birth: 1976-08-30 Referring Provider:   Flowsheet Row INTENSIVE CARDIAC REHAB ORIENT from 02/17/2023 in [facility] for Heart, Vascular, & [place]  Referring Provider Phineas Inches, MD       Initial Encounter Date:  [place] from 02/17/2023 in [facility] for Heart, Vascular, & [facility]  Date 02/17/23       Visit Diagnosis: 12/27/22 AVR (aortic valve replacement)  12/27/22 ascending aorta replacement  Patient's Home Medications on Admission:  Current Outpatient Medications:    acetaminophen (TYLENOL) 500 MG tablet, Take 2 tablets (1,000 mg total) by mouth every 6 (six) hours as needed for mild pain or fever., Disp: 30 tablet, Rfl: 0   atorvastatin (LIPITOR) 40 MG tablet, Take 1 tablet (40 mg total) by mouth daily. (Patient taking differently: Take 40 mg by mouth at bedtime.), Disp: 90 tablet, Rfl: 3   blood glucose meter kit and supplies, Dispense based on patient and insurance preference. Use one time daily. ICD10: E11.65, Disp: 1 each, Rfl: 0   fenofibrate 160 MG tablet, Take 160 mg by mouth daily., Disp: , Rfl:    glipiZIDE (GLUCOTROL) 5 MG tablet, Take 5 mg by mouth at bedtime., Disp: , Rfl:    glucose blood (ONETOUCH VERIO) test strip, 1 each by Other route in the morning and at bedtime. Use as instructed, Disp: 100 each, Rfl: 1   lamoTRIgine (LAMICTAL) 100 MG tablet, Take 1 tablet (100 mg total) by mouth 2 (two) times daily. (Patient not taking: Reported on 01/18/2023), Disp: 60 tablet, Rfl: 5   lamoTRIgine (LAMICTAL) 25 MG tablet, Take 1 pill at bedtime for 2 weeks. Then take 1 pill twice a day for 2 weeks. Then take 1 pill in AM and 2 pills in PM for one week. Then take 2 pills twice a day for one week. Then take 2 pills in AM and 3 pills in PM for one week. Then take 3 pills twice a day for one  week. Then take 3 pills in AM and 4 pills in PM for one week. Then take 4 pills twice a day until pills are gone. Then start 100 mg tablets. (Patient not taking: Reported on 01/18/2023), Disp: 273 tablet, Rfl: 0   metoprolol tartrate (LOPRESSOR) 25 MG tablet, Take 0.5 tablets (12.5 mg total) by mouth 2 (two) times daily., Disp: 30 tablet, Rfl: 3   NOVOLIN 70/30 KWIKPEN (70-30) 100 UNIT/ML KwikPen, Inject 18 Units into the skin in the morning and at bedtime., Disp: , Rfl:    oxyCODONE (OXY IR/ROXICODONE) 5 MG immediate release tablet, Take 1 tablet (5 mg total) by mouth every 4 (four) hours as needed for severe pain. (Patient not taking: Reported on 01/18/2023), Disp: 30 tablet, Rfl: 0   pioglitazone (ACTOS) 15 MG tablet, Take 15 mg by mouth in the morning., Disp: , Rfl:    promethazine (PHENERGAN) 12.5 MG tablet, Take 1 tablet (12.5 mg total) by mouth every 6 (six) hours as needed for nausea or vomiting. (Patient not taking: Reported on 01/18/2023), Disp: 15 tablet, Rfl: 0   VITAMIN D PO, Take 2,000 Units by mouth daily., Disp: , Rfl:    warfarin (COUMADIN) 2.5 MG tablet, Take 1 tablet (2.5 mg total) by mouth daily., Disp: 30 tablet, Rfl: 11   ZENPEP 40000-126000 units CPEP, Take 2 capsules by mouth with breakfast, with lunch, and with  evening meal., Disp: , Rfl:   Past Medical History: Past Medical History:  Diagnosis Date   Diabetes mellitus without complication ([place])    type 2   High cholesterol    Hypertension    Kidney stones    Migraine    MIGRAINE HEADACHE 04/05/2007   Qualifier: Diagnosis of  By: Larose Kells MD, [place] sleep 03/12/2019   Sleep apnea    wears CPAP    Tobacco Use: Social History   Tobacco Use  Smoking Status Never  Smokeless Tobacco Never    Labs: Review Flowsheet  More data exists      Latest Ref Rng & Units 10/12/2021 12/17/2022 12/27/2022 01/04/2023 01/18/2023  Labs for ITP Cardiac and Pulmonary Rehab  Cholestrol 100 - 199 mg/dL - - - - 98   LDL  (calc) 0 - 99 mg/dL - - - - 55   HDL-C >39 mg/dL - - - - 26   Trlycerides 0 - 149 mg/dL - - - - 81   Hemoglobin A1c 4.8 - 5.6 % 5.9  - - 5.8  -  PH, Arterial 7.35 - 7.45 - 7.364  7.334  7.364  7.313  7.410  7.286  7.228  7.403  7.331  - -  PCO2 arterial 32 - 48 mmHg - 40.5  42.2  37.7  44.0  34.9  50.3  58.2  38.5  43.1  - -  Bicarbonate 20.0 - 28.0 mmol/L - 24.1  24.2  23.1  22.5  21.5  22.8  22.1  23.9  24.3  24.8  24.0  22.8  - -  TCO2 22 - 32 mmol/L - 25  26  24  24  23  24  23  23  24  27  25  26  26  26  25  26  24  24   - -  Acid-base deficit 0.0 - 2.0 mmol/L - 2.0  2.0  2.0  3.0  4.0  4.0  2.0  3.0  3.0  1.0  1.0  3.0  - -  O2 Saturation % - 74  82  95  99  100  97  100  100  100  88  100  100  - -    Capillary Blood Glucose: Lab Results  Component Value Date   GLUCAP 99 02/23/2023   GLUCAP 129 (H) 02/23/2023   GLUCAP 105 (H) 02/21/2023   GLUCAP 118 (H) 02/21/2023   GLUCAP 219 (H) 01/09/2023     Exercise Target Goals: Exercise Program Goal: Individual exercise prescription set using results from initial 6 min walk test and THRR while considering  patient's activity barriers and safety.   Exercise Prescription Goal: Initial exercise prescription builds to 30-45 minutes a day of aerobic activity, 2-3 days per week.  Home exercise guidelines will be given to patient during program as part of exercise prescription that the participant will acknowledge.  Activity Barriers & Risk Stratification:  Activity Barriers & Cardiac Risk Stratification - 02/17/23 1309       Activity Barriers & Cardiac Risk Stratification   Activity Barriers Back Problems;Deconditioning;Muscular Weakness;Other (comment)    Comments Sternal Precautions    Cardiac Risk Stratification High             6 Minute Walk:  6 Minute Walk     Row Name 02/17/23 0908         6 Minute Walk   Phase Initial     Distance  1591 feet     Walk Time 6 minutes     # of Rest Breaks 0     MPH 3     METS 4.2      RPE 11     Perceived Dyspnea  0     VO2 Peak 14.7     Symptoms No     Resting HR 64 bpm     Resting BP 118/76     Resting Oxygen Saturation  99 %     Exercise Oxygen Saturation  during 6 min walk 100 %     Max Ex. HR 77 bpm     Max Ex. BP 154/83     2 Minute Post BP 143/86              Oxygen Initial Assessment:   Oxygen Re-Evaluation:   Oxygen Discharge (Final Oxygen Re-Evaluation):   Initial Exercise Prescription:  Initial Exercise Prescription - 02/17/23 1300       Date of Initial Exercise RX and Referring Provider   Date 02/17/23    Referring Provider Phineas Inches, MD    Expected Discharge Date 04/29/23      Treadmill   MPH 2.7    Grade 1    Minutes 15    METs 3.44      Recumbant Bike   Level 2    RPM 60    Watts 50    Minutes 15    METs 3.43      Prescription Details   Frequency (times per week) 3    Duration Progress to 30 minutes of continuous aerobic without signs/symptoms of physical distress      Intensity   THRR 40-80% of Max Heartrate 70-140    Ratings of Perceived Exertion 11-13    Perceived Dyspnea 0-4      Progression   Progression Continue progressive overload as per policy without signs/symptoms or physical distress.      Resistance Training   Training Prescription Yes    Weight 4 lbs    Reps 10-15             Perform Capillary Blood Glucose checks as needed.  Exercise Prescription Changes:   Exercise Prescription Changes     Row Name 02/21/23 0829             Response to Exercise   Blood Pressure (Admit) 130/64       Blood Pressure (Exercise) 140/70       Blood Pressure (Exit) 134/66       Heart Rate (Admit) 84 bpm       Heart Rate (Exercise) 90 bpm       Heart Rate (Exit) 66 bpm       Rating of Perceived Exertion (Exercise) 12.5       Perceived Dyspnea (Exercise) 0       Symptoms 0       Comments Pr first day in the [facility] ICR CRP2 program       Duration Progress to 30 minutes of  aerobic without  signs/symptoms of physical distress       Intensity THRR unchanged         Progression   Progression Continue to progress workloads to maintain intensity without signs/symptoms of physical distress.       Average METs 3.47         Resistance Training   Training Prescription Yes       Weight 4 lbs  Reps 10-15       Time 10 Minutes         Treadmill   MPH 2.7       Grade 1       Minutes 15       METs 3.44         Recumbant Bike   Level 2       RPM 77       Watts 35       Minutes 15       METs 3.5                Exercise Comments:   Exercise Comments     Row Name 02/21/23 0840           Exercise Comments Pt first day in the [place] program. Pt tolerated exercise well with an average MET level of 3.47. Pt is learning his THRR, RPE and ExRx. Off to a good start                Exercise Goals and Review:   Exercise Goals     Row Name 02/17/23 1310             Exercise Goals   Increase Physical Activity Yes       Intervention Provide advice, education, support and counseling about physical activity/exercise needs.;Develop an individualized exercise prescription for aerobic and resistive training based on initial evaluation findings, risk stratification, comorbidities and participant's personal goals.       Expected Outcomes Short Term: Attend rehab on a regular basis to increase amount of physical activity.;Long Term: Add in home exercise to make exercise part of routine and to increase amount of physical activity.;Long Term: Exercising regularly at least 3-5 days a week.       Increase Strength and Stamina Yes       Intervention Provide advice, education, support and counseling about physical activity/exercise needs.;Develop an individualized exercise prescription for aerobic and resistive training based on initial evaluation findings, risk stratification, comorbidities and participant's personal goals.       Expected Outcomes Short Term: Increase  workloads from initial exercise prescription for resistance, speed, and METs.;Short Term: Perform resistance training exercises routinely during rehab and add in resistance training at home;Long Term: Improve cardiorespiratory fitness, muscular endurance and strength as measured by increased METs and functional capacity (6MWT)       Able to understand and use rate of perceived exertion (RPE) scale Yes       Intervention Provide education and explanation on how to use RPE scale       Expected Outcomes Short Term: Able to use RPE daily in rehab to express subjective intensity level;Long Term:  Able to use RPE to guide intensity level when exercising independently       Knowledge and understanding of Target Heart Rate Range (THRR) Yes       Intervention Provide education and explanation of THRR including how the numbers were predicted and where they are located for reference       Expected Outcomes Short Term: Able to state/look up THRR;Short Term: Able to use daily as guideline for intensity in rehab;Long Term: Able to use THRR to govern intensity when exercising independently       Understanding of Exercise Prescription Yes       Intervention Provide education, explanation, and written materials on patient's individual exercise prescription       Expected Outcomes Short Term: Able to explain program  exercise prescription;Long Term: Able to explain home exercise prescription to exercise independently                Exercise Goals Re-Evaluation :  Exercise Goals Re-Evaluation     Row Name 02/21/23 0836             Exercise Goal Re-Evaluation   Exercise Goals Review Increase Physical Activity;Understanding of Exercise Prescription;Increase Strength and Stamina;Knowledge and understanding of Target Heart Rate Range (THRR);Able to understand and use rate of perceived exertion (RPE) scale       Comments Pt first day in the [facility] ICR program. Pt tolerated exercise well with an average MET  level of 3.47. Pt is learning his THRR, RPE and ExRx. Off to a good start       Expected Outcomes Will continue to monitor pt and progress workloads as tolerated without sign or symptom                Discharge Exercise Prescription (Final Exercise Prescription Changes):  Exercise Prescription Changes - 02/21/23 0829       Response to Exercise   Blood Pressure (Admit) 130/64    Blood Pressure (Exercise) 140/70    Blood Pressure (Exit) 134/66    Heart Rate (Admit) 84 bpm    Heart Rate (Exercise) 90 bpm    Heart Rate (Exit) 66 bpm    Rating of Perceived Exertion (Exercise) 12.5    Perceived Dyspnea (Exercise) 0    Symptoms 0    Comments Pr first day in the [facility] ICR CRP2 program    Duration Progress to 30 minutes of  aerobic without signs/symptoms of physical distress    Intensity THRR unchanged      Progression   Progression Continue to progress workloads to maintain intensity without signs/symptoms of physical distress.    Average METs 3.47      Resistance Training   Training Prescription Yes    Weight 4 lbs    Reps 10-15    Time 10 Minutes      Treadmill   MPH 2.7    Grade 1    Minutes 15    METs 3.44      Recumbant Bike   Level 2    RPM 77    Watts 35    Minutes 15    METs 3.5             Nutrition:  Target Goals: Understanding of nutrition guidelines, daily intake of sodium 1500mg , cholesterol 200mg , calories 30% from fat and 7% or less from saturated fats, daily to have 5 or more servings of fruits and vegetables.  Biometrics:  Pre Biometrics - 02/17/23 0825       Pre Biometrics   Waist Circumference 48.5 inches    Hip Circumference 45 inches    Waist to Hip Ratio 1.08 %    Triceps Skinfold 25 mm    % Body Fat 35.9 %    Grip Strength 38 kg    Flexibility 11.25 in    Single Leg Stand 30 seconds              Nutrition Therapy Plan and Nutrition Goals:  Nutrition Therapy & Goals - 02/21/23 0833       Nutrition Therapy   Diet  Heart Healthy/Carbohydrate Consistent Diet    Drug/Food Interactions Statins/Certain Fruits;Coumadin/Vit K      Personal Nutrition Goals   Nutrition Goal Patient to identify strategies for reducing cardiovascular risk by attending the [facility]  education and nutrition series weekly.    Personal Goal #2 Patient to improve diet quality by using the plate method as a guide for meal planning to include lean protein/plant protein, fruits, vegetables, whole grains, nonfat dairy as part of a well-balanced diet.    Personal Goal #3 Patient to identify food sources and limit daily intake of saturated fat, trans fat, sodium, and refined carbohydrates.    Comments Zaheer has medical history of elevated triglycerides, WNL at this time and continues fenofibrate. A1c improved to 5.9. Patient will continue to benefit from participation in intensive cardiac rehab for nutrition, exercise, and lifestyle modfication.      Intervention Plan   Intervention Prescribe, educate and counsel regarding individualized specific dietary modifications aiming towards targeted core components such as weight, hypertension, lipid management, diabetes, heart failure and other comorbidities.;Nutrition handout(s) given to patient.    Expected Outcomes Short Term Goal: Understand basic principles of dietary content, such as calories, fat, sodium, cholesterol and nutrients.;Long Term Goal: Adherence to prescribed nutrition plan.             Nutrition Assessments:  MEDIFICTS Score Key: ?70 Need to make dietary changes  40-70 Heart Healthy Diet ? 40 Therapeutic Level Cholesterol Diet    Picture Your Plate Scores: <54 Unhealthy dietary pattern with much room for improvement. 41-50 Dietary pattern unlikely to meet recommendations for good health and room for improvement. 51-60 More healthful dietary pattern, with some room for improvement.  >60 Healthy dietary pattern, although there may be some specific behaviors that could be  improved.    Nutrition Goals Re-Evaluation:  Nutrition Goals Re-Evaluation     [place] Name 02/21/23 0833             Goals   Current Weight 238 lb 12.1 oz (108.3 kg)       Comment Lipids improved-HDL 26, lipoproteinA WNL, A1c 5.8- has been as high at 14 in the last 4 years.       Expected Outcome [place]ton has medical history of elevated triglycerides, WNL at this time and continues fenofibrate. A1c improved to 5.9. Patient will continue to benefit from participation in intensive cardiac rehab for nutrition, exercise, and lifestyle modfication.                Nutrition Goals Re-Evaluation:  Nutrition Goals Re-Evaluation     [place] Name 02/21/23 0833             Goals   Current Weight 238 lb 12.1 oz (108.3 kg)       Comment Lipids improved-HDL 26, lipoproteinA WNL, A1c 5.8- has been as high at 14 in the last 4 years.       Expected Outcome Keylen has medical history of elevated triglycerides, WNL at this time and continues fenofibrate. A1c improved to 5.9. Patient will continue to benefit from participation in intensive cardiac rehab for nutrition, exercise, and lifestyle modfication.                Nutrition Goals Discharge (Final Nutrition Goals Re-Evaluation):  Nutrition Goals Re-Evaluation - 02/21/23 2706       Goals   Current Weight 238 lb 12.1 oz (108.3 kg)    Comment Lipids improved-HDL 26, lipoproteinA WNL, A1c 5.8- has been as high at 14 in the last 4 years.    Expected Outcome Armando has medical history of elevated triglycerides, WNL at this time and continues fenofibrate. A1c improved to 5.9. Patient will continue to benefit from participation in intensive cardiac rehab  for nutrition, exercise, and lifestyle modfication.             Psychosocial: Target Goals: Acknowledge presence or absence of significant depression and/or stress, maximize coping skills, provide positive support system. Participant is able to verbalize types and ability to use  techniques and skills needed for reducing stress and depression.  Initial Review & Psychosocial Screening:  Initial Psych Review & Screening - 02/17/23 1321       Initial Review   Current issues with None Identified      Family Dynamics   Good Support System? Yes    Comments Pt has his spouse for support      Barriers   Psychosocial barriers to participate in program There are no identifiable barriers or psychosocial needs.      Screening Interventions   Interventions Encouraged to exercise             Quality of Life Scores:  Quality of Life - 02/17/23 1324       Quality of Life   Select Quality of Life      Quality of Life Scores   Health/Function Pre 20.73 %    Socioeconomic Pre 23.93 %    Psych/Spiritual Pre 18.64 %    Family Pre 24 %    GLOBAL Pre 21.44 %            Scores of 19 and below usually indicate a poorer quality of life in these areas.  A difference of  2-3 points is a clinically meaningful difference.  A difference of 2-3 points in the total score of the Quality of Life Index has been associated with significant improvement in overall quality of life, self-image, physical symptoms, and general health in studies assessing change in quality of life.  PHQ-9: Review Flowsheet       02/17/2023 10/07/2021 02/01/2019  Depression screen PHQ 2/9  Decreased Interest 0 0 0  Down, Depressed, Hopeless 0 1 0  PHQ - 2 Score 0 1 0  Altered sleeping 0 0 -  Tired, decreased energy 1 0 -  Change in appetite 0 0 -  Feeling bad or failure about yourself  0 1 -  Trouble concentrating 0 0 -  Moving slowly or fidgety/restless 0 0 -  Suicidal thoughts 0 0 -  PHQ-9 Score 1 2 -  Difficult doing work/chores Not difficult at all Not difficult at all -   Interpretation of Total Score  Total Score Depression Severity:  1-4 = Minimal depression, 5-9 = Mild depression, 10-14 = Moderate depression, 15-19 = Moderately severe depression, 20-27 = Severe depression    Psychosocial Evaluation and Intervention:   Psychosocial Re-Evaluation:  Psychosocial Re-Evaluation     Row Name 02/28/23 1735             Psychosocial Re-Evaluation   Current issues with None Identified       Interventions Encouraged to attend Cardiac Rehabilitation for the exercise       Continue Psychosocial Services  No Follow up required                Psychosocial Discharge (Final Psychosocial Re-Evaluation):  Psychosocial Re-Evaluation - 02/28/23 1735       Psychosocial Re-Evaluation   Current issues with None Identified    Interventions Encouraged to attend Cardiac Rehabilitation for the exercise    Continue Psychosocial Services  No Follow up required             Vocational Rehabilitation: Provide vocational rehab  assistance to qualifying candidates.   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 Pt has no vocational needs at this time.             Education: Education Goals: Education classes will be provided on a weekly basis, covering required topics. Participant will state understanding/return demonstration of topics presented.    Education     Row Name 02/21/23 1200     Education   Cardiac Education Topics [facility]   Select Workshops     Workshops   Educator Exercise Physiologist   Select Exercise   Exercise Workshop Hotel manager and Fall Prevention   Instruction Review Code 1- Verbalizes Understanding   Class Start Time 0815   Class Stop Time 0855   Class Time Calculation (min) 40 min    [place] Name 02/23/23 1000     Education   Cardiac Education Topics [employer]   Educator Dietitian   Weekly Topic Fast and Healthy Breakfasts   Instruction Review  Code 1- Verbalizes Understanding   Class Start Time 0813   Class Stop Time 0856   Class Time Calculation (min) 43 min    [place] Name 02/25/23 0900     Education   Cardiac Education Topics [facility]   Select Core Videos     Core Videos   Educator Dietitian   Select Nutrition   Nutrition Overview of the [facility] Eating Plan   Instruction Review Code 1- Verbalizes Understanding   Class Start Time 0815   Class Stop Time 0905   Class Time Calculation (min) 50 min            Core Videos: Exercise    Move It!  Clinical staff conducted group or individual video education with verbal and written material and guidebook.  Patient learns the recommended [facility] exercise program. Exercise with the goal of living a long, healthy life. Some of the health benefits of exercise include controlled diabetes, healthier blood pressure levels, improved cholesterol levels, improved heart and lung capacity, improved sleep, and better body composition. Everyone should speak with their doctor before starting or changing an exercise routine.  Biomechanical Limitations Clinical staff conducted group or individual video education with verbal and written material and guidebook.  Patient learns how biomechanical limitations can impact exercise and how we can mitigate and possibly overcome limitations to have an impactful and balanced exercise routine.  Body Composition Clinical staff conducted group or individual video education with verbal and written material and guidebook.  Patient learns that body composition (ratio of muscle mass to fat mass) is a key component to assessing overall fitness, rather than body weight alone. Increased fat mass, especially visceral belly fat, can put [place] at increased risk for metabolic syndrome, type 2 diabetes, heart disease, and even death. It is recommended to combine diet and exercise (cardiovascular and resistance training) to improve your body composition. Seek guidance from  your physician and exercise physiologist before implementing an exercise routine.  Exercise Action Plan Clinical staff conducted group or individual video education with verbal and written material and guidebook.  Patient learns the recommended strategies to achieve and enjoy long-term exercise adherence, including variety, self-motivation, self-efficacy, and positive decision making. Benefits of  exercise include fitness, good health, weight management, more energy, better sleep, less stress, and overall well-being.  Medical   Heart Disease Risk Reduction Clinical staff conducted group or individual video education with verbal and written material and guidebook.  Patient learns our heart is our most vital organ as it circulates oxygen, nutrients, white blood cells, and hormones throughout the entire body, and carries waste away. Data supports a plant-based eating plan like the [facility] Program for its effectiveness in slowing progression of and reversing heart disease. The video provides a number of recommendations to address heart disease.   Metabolic Syndrome and Belly Fat  Clinical staff conducted group or individual video education with verbal and written material and guidebook.  Patient learns what metabolic syndrome is, how it leads to heart disease, and how one can reverse it and keep it from coming back. You have metabolic syndrome if you have 3 of the following 5 criteria: abdominal obesity, high blood pressure, high triglycerides, low HDL cholesterol, and high blood sugar.  Hypertension and Heart Disease Clinical staff conducted group or individual video education with verbal and written material and guidebook.  Patient learns that high blood pressure, or hypertension, is very common in the [place]. Hypertension is largely due to excessive salt intake, but other important risk factors include being overweight, physical inactivity, drinking too much alcohol, smoking, and not eating  enough potassium from fruits and vegetables. High blood pressure is a leading risk factor for heart attack, stroke, congestive heart failure, dementia, kidney failure, and premature death. Long-term effects of excessive salt intake include stiffening of the arteries and thickening of heart muscle and organ damage. Recommendations include ways to reduce hypertension and the risk of heart disease.  Diseases of Our Time - Focusing on Diabetes Clinical staff conducted group or individual video education with verbal and written material and guidebook.  Patient learns why the best way to stop diseases of our time is prevention, through food and other lifestyle changes. Medicine (such as prescription pills and surgeries) is often only a Band-Aid on the problem, not a long-term solution. Most common diseases of our time include obesity, type 2 diabetes, hypertension, heart disease, and cancer. The Pritikin Program is recommended and has been proven to help reduce, reverse, and/or prevent the damaging effects of metabolic syndrome.  Nutrition   Overview of the Pritikin Eating Plan  Clinical staff conducted group or individual video education with verbal and written material and guidebook.  Patient learns about the [place] for disease risk reduction. The [place] emphasizes a wide variety of unrefined, minimally-processed carbohydrates, like fruits, vegetables, whole grains, and legumes. Go, Caution, and Stop food choices are explained. Plant-based and lean animal proteins are emphasized. Rationale provided for low sodium intake for blood pressure control, low added sugars for blood sugar stabilization, and low added fats and oils for coronary artery disease risk reduction and weight management.  Calorie Density  Clinical staff conducted group or individual video education with verbal and written material and guidebook.  Patient learns about calorie density and how it impacts the Pritikin  Eating Plan. Knowing the characteristics of the food you choose will help you decide whether those foods will lead to weight gain or weight loss, and whether you want to consume more or less of them. Weight loss is usually a side effect of the Pritikin Eating Plan because of its focus on low calorie-dense foods.  Label Reading  Clinical staff conducted group or individual video education  with verbal and written material and guidebook.  Patient learns about the Pritikin recommended label reading guidelines and corresponding recommendations regarding calorie density, added sugars, sodium content, and whole grains.  Dining Out - Part 1  Clinical staff conducted group or individual video education with verbal and written material and guidebook.  Patient learns that restaurant meals can be sabotaging because they can be so high in calories, fat, sodium, and/or sugar. Patient learns recommended strategies on how to positively address this and avoid unhealthy pitfalls.  Facts on Fats  Clinical staff conducted group or individual video education with verbal and written material and guidebook.  Patient learns that lifestyle modifications can be just as effective, if not more so, as many medications for lowering your risk of heart disease. A Pritikin lifestyle can help to reduce your risk of inflammation and atherosclerosis (cholesterol build-up, or plaque, in the artery walls). Lifestyle interventions such as dietary choices and physical activity address the cause of atherosclerosis. A review of the types of fats and their impact on blood cholesterol levels, along with dietary recommendations to reduce fat intake is also included.  Nutrition Action Plan  Clinical staff conducted group or individual video education with verbal and written material and guidebook.  Patient learns how to incorporate Pritikin recommendations into their lifestyle. Recommendations include planning and keeping personal health goals  in mind as an important part of their success.  Healthy Mind-Set    Healthy Minds, Bodies, Hearts  Clinical staff conducted group or individual video education with verbal and written material and guidebook.  Patient learns how to identify when they are stressed. Video will discuss the impact of that stress, as well as the many benefits of stress management. Patient will also be introduced to stress management techniques. The way we think, act, and feel has an impact on our hearts.  How Our Thoughts Can Heal Our Hearts  Clinical staff conducted group or individual video education with verbal and written material and guidebook.  Patient learns that negative thoughts can cause depression and anxiety. This can result in negative lifestyle behavior and serious health problems. Cognitive behavioral therapy is an effective method to help control our thoughts in order to change and improve our emotional outlook.  Additional Videos:  Exercise    Improving Performance  Clinical staff conducted group or individual video education with verbal and written material and guidebook.  Patient learns to use a non-linear approach by alternating intensity levels and lengths of time spent exercising to help burn more calories and lose more body fat. Cardiovascular exercise helps improve heart health, metabolism, hormonal balance, blood sugar control, and recovery from fatigue. Resistance training improves strength, endurance, balance, coordination, reaction time, metabolism, and muscle mass. Flexibility exercise improves circulation, posture, and balance. Seek guidance from your physician and exercise physiologist before implementing an exercise routine and learn your capabilities and proper form for all exercise.  Introduction to Yoga  Clinical staff conducted group or individual video education with verbal and written material and guidebook.  Patient learns about yoga, a discipline of the coming together of mind,  breath, and body. The benefits of yoga include improved flexibility, improved range of motion, better posture and core strength, increased lung function, weight loss, and positive self-image. Yoga's heart health benefits include lowered blood pressure, healthier heart rate, decreased cholesterol and triglyceride levels, improved immune function, and reduced stress. Seek guidance from your physician and exercise physiologist before implementing an exercise routine and learn your capabilities and proper form for  all exercise.  Medical   Aging: Enhancing Your Quality of Life  Clinical staff conducted group or individual video education with verbal and written material and guidebook.  Patient learns key strategies and recommendations to stay in good physical health and enhance quality of life, such as prevention strategies, having an advocate, securing a [place], and keeping a list of medications and system for tracking them. It also discusses how to avoid risk for bone loss.  Biology of Weight Control  Clinical staff conducted group or individual video education with verbal and written material and guidebook.  Patient learns that weight gain occurs because we consume more calories than we burn (eating more, moving less). Even if your body weight is normal, you may have higher ratios of fat compared to muscle mass. Too much body fat puts you at increased risk for cardiovascular disease, heart attack, stroke, type 2 diabetes, and obesity-related cancers. In addition to exercise, following the [place] can help reduce your risk.  Decoding Lab Results  Clinical staff conducted group or individual video education with verbal and written material and guidebook.  Patient learns that lab test reflects one measurement whose values change over time and are influenced by many factors, including medication, stress, sleep, exercise, food, hydration, pre-existing medical  conditions, and more. It is recommended to use the knowledge from this video to become more involved with your lab results and evaluate your numbers to speak with your doctor.   Diseases of Our Time - Overview  Clinical staff conducted group or individual video education with verbal and written material and guidebook.  Patient learns that according to the CDC, 50% to 70% of chronic diseases (such as obesity, type 2 diabetes, elevated lipids, hypertension, and heart disease) are avoidable through lifestyle improvements including healthier food choices, listening to satiety cues, and increased physical activity.  Sleep Disorders Clinical staff conducted group or individual video education with verbal and written material and guidebook.  Patient learns how good quality and duration of sleep are important to overall health and well-being. Patient also learns about sleep disorders and how they impact health along with recommendations to address them, including discussing with a physician.  Nutrition  Dining Out - Part 2 Clinical staff conducted group or individual video education with verbal and written material and guidebook.  Patient learns how to plan ahead and communicate in order to maximize their dining experience in a healthy and nutritious manner. Included are recommended food choices based on the type of restaurant the patient is visiting.   Fueling a Best boy conducted group or individual video education with verbal and written material and guidebook.  There is a strong connection between our food choices and our health. Diseases like obesity and type 2 diabetes are very prevalent and are in large-part due to lifestyle choices. The Pritikin Eating Plan provides plenty of food and hunger-curbing satisfaction. It is easy to follow, affordable, and helps reduce health risks.  Menu Workshop  Clinical staff conducted group or individual video education with verbal and written  material and guidebook.  Patient learns that restaurant meals can sabotage health goals because they are often packed with calories, fat, sodium, and sugar. Recommendations include strategies to plan ahead and to communicate with the manager, chef, or server to help order a healthier meal.  Planning Your Eating Strategy  Clinical staff conducted group or individual video education with verbal and written material and guidebook.  Patient  learns about the [place] and its benefit of reducing the risk of disease. The [place] does not focus on calories. Instead, it emphasizes high-quality, nutrient-rich foods. By knowing the characteristics of the foods, we choose, we can determine their calorie density and make informed decisions.  Targeting Your Nutrition Priorities  Clinical staff conducted group or individual video education with verbal and written material and guidebook.  Patient learns that lifestyle habits have a tremendous impact on disease risk and progression. This video provides eating and physical activity recommendations based on your personal health goals, such as reducing LDL cholesterol, losing weight, preventing or controlling type 2 diabetes, and reducing high blood pressure.  Vitamins and Minerals  Clinical staff conducted group or individual video education with verbal and written material and guidebook.  Patient learns different ways to obtain key vitamins and minerals, including through a recommended healthy diet. It is important to discuss all supplements you take with your doctor.   Healthy Mind-Set    Smoking Cessation  Clinical staff conducted group or individual video education with verbal and written material and guidebook.  Patient learns that cigarette smoking and tobacco addiction pose a serious health risk which affects millions of people. Stopping smoking will significantly reduce the risk of heart disease, lung disease, and many forms of  cancer. Recommended strategies for quitting are covered, including working with your doctor to develop a successful plan.  Culinary   Becoming a Financial trader conducted group or individual video education with verbal and written material and guidebook.  Patient learns that cooking at home can be healthy, cost-effective, quick, and puts them in control. Keys to cooking healthy recipes will include looking at your recipe, assessing your equipment needs, planning ahead, making it simple, choosing cost-effective seasonal ingredients, and limiting the use of added fats, salts, and sugars.  Cooking - Breakfast and Snacks  Clinical staff conducted group or individual video education with verbal and written material and guidebook.  Patient learns how important breakfast is to satiety and nutrition through the entire day. Recommendations include key foods to eat during breakfast to help stabilize blood sugar levels and to prevent overeating at meals later in the day. Planning ahead is also a key component.  Cooking - Human resources officer conducted group or individual video education with verbal and written material and guidebook.  Patient learns eating strategies to improve overall health, including an approach to cook more at home. Recommendations include thinking of animal protein as a side on your plate rather than center stage and focusing instead on lower calorie dense options like vegetables, fruits, whole grains, and plant-based proteins, such as beans. Making sauces in large quantities to freeze for later and leaving the skin on your vegetables are also recommended to maximize your experience.  Cooking - Healthy Salads and Dressing Clinical staff conducted group or individual video education with verbal and written material and guidebook.  Patient learns that vegetables, fruits, whole grains, and legumes are the foundations of the [place]. Recommendations  include how to incorporate each of these in flavorful and healthy salads, and how to create homemade salad dressings. Proper handling of ingredients is also covered. Cooking - Soups and [employer] - Soups and Desserts Clinical staff conducted group or individual video education with verbal and written material and guidebook.  Patient learns that Pritikin soups and desserts make for easy, nutritious, and delicious snacks and meal components that are low in  sodium, fat, sugar, and calorie density, while high in vitamins, minerals, and filling fiber. Recommendations include simple and healthy ideas for soups and desserts.   Overview     The Pritikin Solution Program Overview Clinical staff conducted group or individual video education with verbal and written material and guidebook.  Patient learns that the results of the [place] Program have been documented in more than 100 articles published in peer-reviewed journals, and the benefits include reducing risk factors for (and, in some cases, even reversing) high cholesterol, high blood pressure, type 2 diabetes, obesity, and more! An overview of the three key pillars of the Pritikin Program will be covered: eating well, doing regular exercise, and having a healthy mind-set.  WORKSHOPS  Exercise: Exercise Basics: Building Your Action Plan Clinical staff led group instruction and group discussion with PowerPoint presentation and patient guidebook. To enhance the learning environment the use of posters, models and videos may be added. At the conclusion of this workshop, patients will comprehend the difference between physical activity and exercise, as well as the benefits of incorporating both, into their routine. Patients will understand the FITT (Frequency, Intensity, Time, and Type) principle and how to use it to build an exercise action plan. In addition, safety concerns and other considerations for exercise and cardiac rehab will be addressed  by the presenter. The purpose of this lesson is to promote a comprehensive and effective weekly exercise routine in order to improve patients' overall level of fitness.   Managing Heart Disease: Your Path to a Healthier Heart Clinical staff led group instruction and group discussion with PowerPoint presentation and patient guidebook. To enhance the learning environment the use of posters, models and videos may be added.At the conclusion of this workshop, patients will understand the anatomy and physiology of the heart. Additionally, they will understand how Pritikin's three pillars impact the risk factors, the progression, and the management of heart disease.  The purpose of this lesson is to provide a high-level overview of the heart, heart disease, and how the Pritikin lifestyle positively impacts risk factors.  Exercise Biomechanics Clinical staff led group instruction and group discussion with PowerPoint presentation and patient guidebook. To enhance the learning environment the use of posters, models and videos may be added. Patients will learn how the structural parts of their bodies function and how these functions impact their daily activities, movement, and exercise. Patients will learn how to promote a neutral spine, learn how to manage pain, and identify ways to improve their physical movement in order to promote healthy living. The purpose of this lesson is to expose patients to common physical limitations that impact physical activity. Participants will learn practical ways to adapt and manage aches and pains, and to minimize their effect on regular exercise. Patients will learn how to maintain good posture while sitting, walking, and lifting.  Balance Training and Fall Prevention  Clinical staff led group instruction and group discussion with PowerPoint presentation and patient guidebook. To enhance the learning environment the use of posters, models and videos may be added. At  the conclusion of this workshop, patients will understand the importance of their sensorimotor skills (vision, proprioception, and the vestibular system) in maintaining their ability to balance as they age. Patients will apply a variety of balancing exercises that are appropriate for their current level of function. Patients will understand the common causes for poor balance, possible solutions to these problems, and ways to modify their physical environment in order to minimize their fall risk. The purpose  of this lesson is to teach patients about the importance of maintaining balance as they age and ways to minimize their risk of falling.  WORKSHOPS   Nutrition:  Fueling a Scientist, research (physical sciences) led group instruction and group discussion with PowerPoint presentation and patient guidebook. To enhance the learning environment the use of posters, models and videos may be added. Patients will review the foundational principles of the [place] and understand what constitutes a serving size in each of the food groups. Patients will also learn Pritikin-friendly foods that are better choices when away from home and review make-ahead meal and snack options. Calorie density will be reviewed and applied to three nutrition priorities: weight maintenance, weight loss, and weight gain. The purpose of this lesson is to reinforce (in a group setting) the key concepts around what patients are recommended to eat and how to apply these guidelines when away from home by planning and selecting Pritikin-friendly options. Patients will understand how calorie density may be adjusted for different weight management goals.  Mindful Eating  Clinical staff led group instruction and group discussion with PowerPoint presentation and patient guidebook. To enhance the learning environment the use of posters, models and videos may be added. Patients will briefly review the concepts of the [place] and the  importance of low-calorie dense foods. The concept of mindful eating will be introduced as well as the importance of paying attention to internal hunger signals. Triggers for non-hunger eating and techniques for dealing with triggers will be explored. The purpose of this lesson is to provide patients with the opportunity to review the basic principles of the [place], discuss the value of eating mindfully and how to measure internal cues of hunger and fullness using the Hunger Scale. Patients will also discuss reasons for non-hunger eating and learn strategies to use for controlling emotional eating.  Targeting Your Nutrition Priorities Clinical staff led group instruction and group discussion with PowerPoint presentation and patient guidebook. To enhance the learning environment the use of posters, models and videos may be added. Patients will learn how to determine their genetic susceptibility to disease by reviewing their family history. Patients will gain insight into the importance of diet as part of an overall healthy lifestyle in mitigating the impact of genetics and other environmental insults. The purpose of this lesson is to provide patients with the opportunity to assess their personal nutrition priorities by looking at their family history, their own health history and current risk factors. Patients will also be able to discuss ways of prioritizing and modifying the [place] for their highest risk areas  Menu  Clinical staff led group instruction and group discussion with PowerPoint presentation and patient guidebook. To enhance the learning environment the use of posters, models and videos may be added. Using menus brought in from [employer], or printed from [employer], patients will apply the [place] dining out guidelines that were presented in the [employer] video. Patients will also be able to practice these guidelines in a variety of  provided scenarios. The purpose of this lesson is to provide patients with the opportunity to practice hands-on learning of the [place] with actual menus and practice scenarios.  Label Reading Clinical staff led group instruction and group discussion with PowerPoint presentation and patient guidebook. To enhance the learning environment the use of posters, models and videos may be added. Patients will review and discuss the Pritikin label reading guidelines presented  in Pritikin's Label Reading Educational series video. Using fool labels brought in from local grocery stores and markets, patients will apply the label reading guidelines and determine if the packaged food meet the Pritikin guidelines. The purpose of this lesson is to provide patients with the opportunity to review, discuss, and practice hands-on learning of the Pritikin Label Reading guidelines with actual packaged food labels. [place] Workshops are designed to teach patients ways to prepare quick, simple, and affordable recipes at home. The importance of nutrition's role in chronic disease risk reduction is reflected in its emphasis in the overall Pritikin program. By learning how to prepare essential core Pritikin Eating Plan recipes, patients will increase control over what they eat; be able to customize the flavor of foods without the use of added salt, sugar, or fat; and improve the quality of the food they consume. By learning a set of core recipes which are easily assembled, quickly prepared, and affordable, patients are more likely to prepare more healthy foods at home. These workshops focus on convenient breakfasts, simple entres, side dishes, and desserts which can be prepared with minimal effort and are consistent with nutrition recommendations for cardiovascular risk reduction. Cooking [employer] are taught by a Engineer, materials (RD) who has been trained by the  [employer]. The chef or RD has a clear understanding of the importance of minimizing - if not completely eliminating - added fat, sugar, and sodium in recipes. Throughout the series of [place] Workshop sessions, patients will learn about healthy ingredients and efficient methods of cooking to build confidence in their capability to prepare    Cooking School weekly topics:  Adding Flavor- Sodium-Free  Fast and Healthy Breakfasts  Powerhouse Plant-Based Proteins  Satisfying Salads and Dressings  Simple Sides and Sauces  International Cuisine-Spotlight on the [place] Zones  Delicious Desserts  Savory Soups  [employer] - Meals in a Agricultural consultant Appetizers and Snacks  Comforting Weekend Breakfasts  One-Pot Wonders   Fast Evening Meals  Contractor Your Pritikin Plate  WORKSHOPS   Healthy Mindset (Psychosocial):  Focused Goals, Sustainable Changes Clinical staff led group instruction and group discussion with PowerPoint presentation and patient guidebook. To enhance the learning environment the use of posters, models and videos may be added. Patients will be able to apply effective goal setting strategies to establish at least one personal goal, and then take consistent, meaningful action toward that goal. They will learn to identify common barriers to achieving personal goals and develop strategies to overcome them. Patients will also gain an understanding of how our mind-set can impact our ability to achieve goals and the importance of cultivating a positive and growth-oriented mind-set. The purpose of this lesson is to provide patients with a deeper understanding of how to set and achieve personal goals, as well as the tools and strategies needed to overcome common obstacles which may arise along the way.  From Head to Heart: The Power of a Healthy Outlook  Clinical staff led group instruction and group discussion with PowerPoint presentation  and patient guidebook. To enhance the learning environment the use of posters, models and videos may be added. Patients will be able to recognize and describe the impact of emotions and mood on physical health. They will discover the importance of self-care and explore self-care practices which may work for them. Patients will also learn how to utilize the 4 C's to cultivate a healthier outlook and  better manage stress and challenges. The purpose of this lesson is to demonstrate to patients how a healthy outlook is an essential part of maintaining good health, especially as they continue their cardiac rehab journey.  Healthy Sleep for a Healthy Heart Clinical staff led group instruction and group discussion with PowerPoint presentation and patient guidebook. To enhance the learning environment the use of posters, models and videos may be added. At the conclusion of this workshop, patients will be able to demonstrate knowledge of the importance of sleep to overall health, well-being, and quality of life. They will understand the symptoms of, and treatments for, common sleep disorders. Patients will also be able to identify daytime and nighttime behaviors which impact sleep, and they will be able to apply these tools to help manage sleep-related challenges. The purpose of this lesson is to provide patients with a general overview of sleep and outline the importance of quality sleep. Patients will learn about a few of the most common sleep disorders. Patients will also be introduced to the concept of "sleep hygiene," and discover ways to self-manage certain sleeping problems through simple daily behavior changes. Finally, the workshop will motivate patients by clarifying the links between quality sleep and their goals of heart-healthy living.   Recognizing and Reducing Stress Clinical staff led group instruction and group discussion with PowerPoint presentation and patient guidebook. To enhance the learning  environment the use of posters, models and videos may be added. At the conclusion of this workshop, patients will be able to understand the types of stress reactions, differentiate between acute and chronic stress, and recognize the impact that chronic stress has on their health. They will also be able to apply different coping mechanisms, such as reframing negative self-talk. Patients will have the opportunity to practice a variety of stress management techniques, such as deep abdominal breathing, progressive muscle relaxation, and/or guided imagery.  The purpose of this lesson is to educate patients on the role of stress in their lives and to provide healthy techniques for coping with it.  Learning Barriers/Preferences:  Learning Barriers/Preferences - 02/17/23 1324       Learning Barriers/Preferences   Learning Barriers Sight   wears contacts   Learning Preferences Video;Computer/Internet;Group Instruction;Individual Instruction;Pictoral;Skilled Demonstration             Education Topics:  Knowledge Questionnaire Score:  Knowledge Questionnaire Score - 02/17/23 1325       Knowledge Questionnaire Score   Pre Score 22/24             Core Components/Risk Factors/Patient Goals at Admission:  Personal Goals and Risk Factors at Admission - 02/17/23 1326       Core Components/Risk Factors/Patient Goals on Admission    Weight Management Yes;Obesity;Weight Loss    Intervention Weight Management: Develop a combined nutrition and exercise program designed to reach desired caloric intake, while maintaining appropriate intake of nutrient and fiber, sodium and fats, and appropriate energy expenditure required for the weight goal.;Weight Management: Provide education and appropriate resources to help participant work on and attain dietary goals.;Weight Management/Obesity: Establish reasonable short term and long term weight goals.    Admit Weight 240 lb 15.4 oz (109.3 kg)    Goal Weight:  Long Term 190 lb (86.2 kg)    Expected Outcomes Short Term: Continue to assess and modify interventions until short term weight is achieved;Long Term: Adherence to nutrition and physical activity/exercise program aimed toward attainment of established weight goal;Weight Maintenance: Understanding of the daily nutrition guidelines, which  includes 25-35% calories from fat, 7% or less cal from saturated fats, less than 200mg  cholesterol, less than 1.5gm of sodium, & 5 or more servings of fruits and vegetables daily;Weight Loss: Understanding of general recommendations for a balanced deficit meal plan, which promotes 1-2 lb weight loss per week and includes a negative energy balance of [phone] kcal/d;Understanding recommendations for meals to include 15-35% energy as protein, 25-35% energy from fat, 35-60% energy from carbohydrates, less than 200mg  of dietary cholesterol, 20-35 gm of total fiber daily;Understanding of distribution of calorie intake throughout the day with the consumption of 4-5 meals/snacks    Diabetes Yes    Intervention Provide education about signs/symptoms and action to take for hypo/hyperglycemia.;Provide education about proper nutrition, including hydration, and aerobic/resistive exercise prescription along with prescribed medications to achieve blood glucose in normal ranges: Fasting glucose 65-99 mg/dL    Expected Outcomes Short Term: Participant verbalizes understanding of the signs/symptoms and immediate care of hyper/hypoglycemia, proper foot care and importance of medication, aerobic/resistive exercise and nutrition plan for blood glucose control.;Long Term: Attainment of HbA1C < 7%.    Hypertension Yes    Intervention Provide education on lifestyle modifcations including regular physical activity/exercise, weight management, moderate sodium restriction and increased consumption of fresh fruit, vegetables, and low fat dairy, alcohol moderation, and smoking cessation.;Monitor  prescription use compliance.    Expected Outcomes Short Term: Continued assessment and intervention until BP is < 140/22mm HG in hypertensive participants. < 130/86mm HG in hypertensive participants with diabetes, heart failure or chronic kidney disease.;Long Term: Maintenance of blood pressure at goal levels.    Lipids Yes    Intervention Provide education and support for participant on nutrition & aerobic/resistive exercise along with prescribed medications to achieve LDL 70mg , HDL >40mg .    Expected Outcomes Short Term: Participant states understanding of desired cholesterol values and is compliant with medications prescribed. Participant is following exercise prescription and nutrition guidelines.;Long Term: Cholesterol controlled with medications as prescribed, with individualized exercise RX and with personalized nutrition plan. Value goals: LDL < 70mg , HDL > 40 mg.             Core Components/Risk Factors/Patient Goals Review:   Goals and Risk Factor Review     Row Name 03/01/23 0748             Core Components/Risk Factors/Patient Goals Review   Personal Goals Review Weight Management/Obesity;Hypertension;Lipids       Review Ken started intensive cardiac rehab on 02/21/23 and is off to a good start to exercise. Vital signs and CBG's have been stable       Expected Outcomes Yasmin will continue to participate in intensive cardiac rehab for exercise, nutrition and lifestyle modifictions                Core Components/Risk Factors/Patient Goals at Discharge (Final Review):   Goals and Risk Factor Review - 03/01/23 0748       Core Components/Risk Factors/Patient Goals Review   Personal Goals Review Weight Management/Obesity;Hypertension;Lipids    Review Tychicus started intensive cardiac rehab on 02/21/23 and is off to a good start to exercise. Vital signs and CBG's have been stable    Expected Outcomes Drayven will continue to participate in intensive cardiac  rehab for exercise, nutrition and lifestyle modifictions             ITP Comments:  ITP Comments     Row Name 02/17/23 0836 02/28/23 1734         ITP Comments Dr.  Fransico Him Market researcher. Introduction to pritikin education program/ intensive cardiac rehab. Intitial orientation packet reviewed with patient 30 Day ITP Review. Jothnathan started intensive cardiac rehab on 02/21/23 and is off to a good start to exercise.               Comments: See ITP comments.Harrell Gave RN BSN

## 2023-03-02 ENCOUNTER — Ambulatory Visit (INDEPENDENT_AMBULATORY_CARE_PROVIDER_SITE_OTHER): Payer: BC Managed Care – PPO | Admitting: Surgery

## 2023-03-02 ENCOUNTER — Encounter (HOSPITAL_COMMUNITY)
Admission: RE | Admit: 2023-03-02 | Discharge: 2023-03-02 | Disposition: A | Discharge status: Home / Self Care | Payer: BC Managed Care – PPO | Source: Ambulatory Visit | Attending: Internal Medicine | Admitting: Internal Medicine

## 2023-03-02 ENCOUNTER — Encounter: Payer: Self-pay | Admitting: Surgery

## 2023-03-02 VITALS — BP 145/90 | HR 77 | Resp 20 | Ht 68.0 in | Wt 233.0 lb

## 2023-03-02 DIAGNOSIS — I7121 Aneurysm of the ascending aorta, without rupture: Secondary | ICD-10-CM | POA: Diagnosis not present

## 2023-03-02 DIAGNOSIS — Z09 Encounter for follow-up examination after completed treatment for conditions other than malignant neoplasm: Secondary | ICD-10-CM

## 2023-03-02 DIAGNOSIS — Z5189 Encounter for other specified aftercare: Secondary | ICD-10-CM | POA: Diagnosis not present

## 2023-03-02 DIAGNOSIS — Z952 Presence of prosthetic heart valve: Secondary | ICD-10-CM

## 2023-03-02 DIAGNOSIS — Z95828 Presence of other vascular implants and grafts: Secondary | ICD-10-CM

## 2023-03-02 NOTE — Progress Notes (Signed)
    HPI: ***  Current Outpatient Medications  Medication Sig Dispense Refill   acetaminophen (TYLENOL) 500 MG tablet Take 2 tablets (1,000 mg total) by mouth every 6 (six) hours as needed for mild pain or fever. 30 tablet 0   atorvastatin (LIPITOR) 40 MG tablet Take 1 tablet (40 mg total) by mouth daily. (Patient taking differently: Take 40 mg by mouth at bedtime.) 90 tablet 3   blood glucose meter kit and supplies Dispense based on patient and insurance preference. Use one time daily. ICD10: E11.65 1 each 0   fenofibrate 160 MG tablet Take 160 mg by mouth daily.     glipiZIDE (GLUCOTROL) 5 MG tablet Take 5 mg by mouth at bedtime.     glucose blood (ONETOUCH VERIO) test strip 1 each by Other route in the morning and at bedtime. Use as instructed 100 each 1   lamoTRIgine (LAMICTAL) 100 MG tablet Take 1 tablet (100 mg total) by mouth 2 (two) times daily. 60 tablet 5   lamoTRIgine (LAMICTAL) 25 MG tablet Take 1 pill at bedtime for 2 weeks. Then take 1 pill twice a day for 2 weeks. Then take 1 pill in AM and 2 pills in PM for one week. Then take 2 pills twice a day for one week. Then take 2 pills in AM and 3 pills in PM for one week. Then take 3 pills twice a day for one week. Then take 3 pills in AM and 4 pills in PM for one week. Then take 4 pills twice a day until pills are gone. Then start 100 mg tablets. 273 tablet 0   metoprolol tartrate (LOPRESSOR) 25 MG tablet Take 0.5 tablets (12.5 mg total) by mouth 2 (two) times daily. 30 tablet 3   NOVOLIN 70/30 KWIKPEN (70-30) 100 UNIT/ML KwikPen Inject 18 Units into the skin in the morning and at bedtime.     oxyCODONE (OXY IR/ROXICODONE) 5 MG immediate release tablet Take 1 tablet (5 mg total) by mouth every 4 (four) hours as needed for severe pain. 30 tablet 0   pioglitazone (ACTOS) 15 MG tablet Take 15 mg by mouth in the morning.     promethazine (PHENERGAN) 12.5 MG tablet Take 1 tablet (12.5 mg total) by mouth every 6 (six) hours as needed for nausea  or vomiting. 15 tablet 0   VITAMIN D PO Take 2,000 Units by mouth daily.     warfarin (COUMADIN) 2.5 MG tablet Take 1 tablet (2.5 mg total) by mouth daily. 30 tablet 11   ZENPEP 40000-126000 units CPEP Take 2 capsules by mouth with breakfast, with lunch, and with evening meal.     No current facility-administered medications for this visit.     Physical Exam: ***  Diagnostic Tests: ***  Impression: ***  Plan: ***   Gaye Pollack, MD [facility] [phone]

## 2023-03-02 NOTE — Progress Notes (Signed)
QUALITY OF LIFE SCORE REVIEW Pt completed Quality of Life survey as a participant in Cardiac Rehab.  Scores 19.0 or below are considered low.  Pt scores are as follows: Overall 21.44, Health and Function 20.73, socioeconomic 23.93, physiological and spiritual 18.64, family 24.0. 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 Offered emotional support and reassurance.  Will continue to monitor and intervene as necessary.

## 2023-03-04 ENCOUNTER — Encounter (HOSPITAL_COMMUNITY)
Admission: RE | Admit: 2023-03-04 | Discharge: 2023-03-04 | Disposition: A | Discharge status: Home / Self Care | Payer: BC Managed Care – PPO | Source: Ambulatory Visit | Attending: Internal Medicine | Admitting: Internal Medicine

## 2023-03-04 DIAGNOSIS — I7121 Aneurysm of the ascending aorta, without rupture: Secondary | ICD-10-CM | POA: Diagnosis not present

## 2023-03-04 DIAGNOSIS — Z5189 Encounter for other specified aftercare: Secondary | ICD-10-CM | POA: Diagnosis not present

## 2023-03-04 DIAGNOSIS — Z95828 Presence of other vascular implants and grafts: Secondary | ICD-10-CM

## 2023-03-04 DIAGNOSIS — Z952 Presence of prosthetic heart valve: Secondary | ICD-10-CM

## 2023-03-07 ENCOUNTER — Encounter (HOSPITAL_COMMUNITY): Payer: BC Managed Care – PPO

## 2023-03-09 ENCOUNTER — Encounter (HOSPITAL_COMMUNITY): Payer: BC Managed Care – PPO

## 2023-03-10 ENCOUNTER — Ambulatory Visit: Payer: BC Managed Care – PPO | Admitting: Psychiatry

## 2023-03-11 ENCOUNTER — Ambulatory Visit: Payer: BC Managed Care – PPO

## 2023-03-11 ENCOUNTER — Encounter (HOSPITAL_COMMUNITY): Payer: BC Managed Care – PPO

## 2023-03-14 ENCOUNTER — Encounter (HOSPITAL_COMMUNITY)
Admission: RE | Admit: 2023-03-14 | Discharge: 2023-03-14 | Disposition: A | Discharge status: Home / Self Care | Payer: BC Managed Care – PPO | Source: Ambulatory Visit | Attending: Internal Medicine | Admitting: Internal Medicine

## 2023-03-14 DIAGNOSIS — Z95828 Presence of other vascular implants and grafts: Secondary | ICD-10-CM | POA: Diagnosis not present

## 2023-03-14 DIAGNOSIS — Z952 Presence of prosthetic heart valve: Secondary | ICD-10-CM | POA: Diagnosis not present

## 2023-03-16 ENCOUNTER — Ambulatory Visit: Payer: BC Managed Care – PPO

## 2023-03-16 ENCOUNTER — Encounter (HOSPITAL_COMMUNITY)
Admission: RE | Admit: 2023-03-16 | Discharge: 2023-03-16 | Disposition: A | Discharge status: Home / Self Care | Payer: BC Managed Care – PPO | Source: Ambulatory Visit | Attending: Internal Medicine | Admitting: Internal Medicine

## 2023-03-16 DIAGNOSIS — Z95828 Presence of other vascular implants and grafts: Secondary | ICD-10-CM

## 2023-03-16 DIAGNOSIS — Z952 Presence of prosthetic heart valve: Secondary | ICD-10-CM | POA: Diagnosis not present

## 2023-03-16 NOTE — Progress Notes (Signed)
CARDIAC REHAB PHASE 2  Reviewed home exercise with pt today. Pt is tolerating exercise well. Pt will continue to exercise on his own by walking for 30-45 minutes per session 1-2 days a week in addition to the 3 days in CRP2. Advised pt on THRR, RPE scale, hydration and temperature/humidity precautions. Reinforced S/S to stop exercise and when to call MD vs 911. Encouraged warm up cool down and stretches with exercise sessions. Pt verbalized understanding, all questions were answered and pt was given a copy to take home.    Kirby Funk ACSM-CEP 03/16/2023 8:45 AM

## 2023-03-17 ENCOUNTER — Ambulatory Visit: Payer: BC Managed Care – PPO | Attending: Cardiology

## 2023-03-17 DIAGNOSIS — I3139 Other pericardial effusion (noninflammatory): Secondary | ICD-10-CM

## 2023-03-17 DIAGNOSIS — Z952 Presence of prosthetic heart valve: Secondary | ICD-10-CM | POA: Diagnosis not present

## 2023-03-17 LAB — POCT INR: INR: 1.6 — AB (ref 2.0–3.0)

## 2023-03-17 NOTE — Patient Instructions (Signed)
Description   Today take 2 tablets of warfarin and then START taking warfarin 1 tablet daily EXCEPT 1.5 tablets on Sundays.  Stay consistent with greens each.  Recheck INR in 2 weeks.   [facility] [phone]

## 2023-03-18 ENCOUNTER — Encounter (HOSPITAL_COMMUNITY)
Admission: RE | Admit: 2023-03-18 | Discharge: 2023-03-18 | Disposition: A | Discharge status: Home / Self Care | Payer: BC Managed Care – PPO | Source: Ambulatory Visit | Attending: Internal Medicine | Admitting: Internal Medicine

## 2023-03-18 DIAGNOSIS — Z952 Presence of prosthetic heart valve: Secondary | ICD-10-CM

## 2023-03-18 DIAGNOSIS — Z95828 Presence of other vascular implants and grafts: Secondary | ICD-10-CM

## 2023-03-21 ENCOUNTER — Encounter (HOSPITAL_COMMUNITY)
Admission: RE | Admit: 2023-03-21 | Discharge: 2023-03-21 | Disposition: A | Discharge status: Home / Self Care | Payer: BC Managed Care – PPO | Source: Ambulatory Visit | Attending: Internal Medicine | Admitting: Internal Medicine

## 2023-03-21 DIAGNOSIS — Z952 Presence of prosthetic heart valve: Secondary | ICD-10-CM | POA: Diagnosis not present

## 2023-03-21 DIAGNOSIS — Z95828 Presence of other vascular implants and grafts: Secondary | ICD-10-CM

## 2023-03-23 ENCOUNTER — Encounter (HOSPITAL_COMMUNITY)
Admission: RE | Admit: 2023-03-23 | Discharge: 2023-03-23 | Disposition: A | Discharge status: Home / Self Care | Payer: BC Managed Care – PPO | Source: Ambulatory Visit | Attending: Internal Medicine | Admitting: Internal Medicine

## 2023-03-23 DIAGNOSIS — Z95828 Presence of other vascular implants and grafts: Secondary | ICD-10-CM

## 2023-03-23 DIAGNOSIS — Z952 Presence of prosthetic heart valve: Secondary | ICD-10-CM

## 2023-03-25 ENCOUNTER — Encounter (HOSPITAL_COMMUNITY)
Admission: RE | Admit: 2023-03-25 | Discharge: 2023-03-25 | Disposition: A | Discharge status: Home / Self Care | Payer: BC Managed Care – PPO | Source: Ambulatory Visit | Attending: Internal Medicine | Admitting: Internal Medicine

## 2023-03-25 DIAGNOSIS — Z952 Presence of prosthetic heart valve: Secondary | ICD-10-CM

## 2023-03-25 DIAGNOSIS — Z95828 Presence of other vascular implants and grafts: Secondary | ICD-10-CM | POA: Diagnosis not present

## 2023-03-28 ENCOUNTER — Encounter (HOSPITAL_COMMUNITY)
Admission: RE | Admit: 2023-03-28 | Discharge: 2023-03-28 | Disposition: A | Discharge status: Home / Self Care | Payer: BC Managed Care – PPO | Source: Ambulatory Visit | Attending: Internal Medicine | Admitting: Internal Medicine

## 2023-03-28 DIAGNOSIS — Z95828 Presence of other vascular implants and grafts: Secondary | ICD-10-CM

## 2023-03-28 DIAGNOSIS — Z952 Presence of prosthetic heart valve: Secondary | ICD-10-CM

## 2023-03-28 NOTE — Progress Notes (Signed)
Cardiac Individual Treatment Plan  Patient Details  Name: Douglas Phillips MRN: [id number] Date of Birth: July 05, 1976 Referring Provider:   Flowsheet Row INTENSIVE CARDIAC REHAB ORIENT from 02/17/2023 in [facility]  Referring Provider Carolan Clines, MD       Initial Encounter Date:  Flowsheet Row INTENSIVE CARDIAC REHAB ORIENT from 02/17/2023 in [facility]  Date 02/17/23       Visit Diagnosis: 12/27/22 AVR (aortic valve replacement)  12/27/22 ascending aorta replacement  Patient's Home Medications on Admission:  Current Outpatient Medications:    acetaminophen (TYLENOL) 500 MG tablet, Take 2 tablets (1,000 mg total) by mouth every 6 (six) hours as needed for mild pain or fever., Disp: 30 tablet, Rfl: 0   atorvastatin (LIPITOR) 40 MG tablet, Take 1 tablet (40 mg total) by mouth daily. (Patient taking differently: Take 40 mg by mouth at bedtime.), Disp: 90 tablet, Rfl: 3   blood glucose meter kit and supplies, Dispense based on patient and insurance preference. Use one time daily. ICD10: E11.65, Disp: 1 each, Rfl: 0   fenofibrate 160 MG tablet, Take 160 mg by mouth daily., Disp: , Rfl:    glipiZIDE (GLUCOTROL) 5 MG tablet, Take 5 mg by mouth at bedtime., Disp: , Rfl:    glucose blood (ONETOUCH VERIO) test strip, 1 each by Other route in the morning and at bedtime. Use as instructed, Disp: 100 each, Rfl: 1   lamoTRIgine (LAMICTAL) 100 MG tablet, Take 1 tablet (100 mg total) by mouth 2 (two) times daily., Disp: 60 tablet, Rfl: 5   lamoTRIgine (LAMICTAL) 25 MG tablet, Take 1 pill at bedtime for 2 weeks. Then take 1 pill twice a day for 2 weeks. Then take 1 pill in AM and 2 pills in PM for one week. Then take 2 pills twice a day for one week. Then take 2 pills in AM and 3 pills in PM for one week. Then take 3 pills twice a day for one week. Then take 3 pills in AM and 4 pills in  PM for one week. Then take 4 pills twice a day until pills are gone. Then start 100 mg tablets., Disp: 273 tablet, Rfl: 0   metoprolol tartrate (LOPRESSOR) 25 MG tablet, Take 0.5 tablets (12.5 mg total) by mouth 2 (two) times daily., Disp: 30 tablet, Rfl: 3   NOVOLIN 70/30 KWIKPEN (70-30) 100 UNIT/ML KwikPen, Inject 18 Units into the skin in the morning and at bedtime., Disp: , Rfl:    oxyCODONE (OXY IR/ROXICODONE) 5 MG immediate release tablet, Take 1 tablet (5 mg total) by mouth every 4 (four) hours as needed for severe pain., Disp: 30 tablet, Rfl: 0   pioglitazone (ACTOS) 15 MG tablet, Take 15 mg by mouth in the morning., Disp: , Rfl:    promethazine (PHENERGAN) 12.5 MG tablet, Take 1 tablet (12.5 mg total) by mouth every 6 (six) hours as needed for nausea or vomiting., Disp: 15 tablet, Rfl: 0   VITAMIN D PO, Take 2,000 Units by mouth daily., Disp: , Rfl:    warfarin (COUMADIN) 2.5 MG tablet, Take 1 tablet (2.5 mg total) by mouth daily., Disp: 30 tablet, Rfl: 11   ZENPEP 40000-126000 units CPEP, Take 2 capsules by mouth with breakfast, with lunch, and with evening meal., Disp: , Rfl:   Past Medical History: Past Medical History:  Diagnosis Date   Diabetes mellitus without complication (HCC)  type 2   High cholesterol    Hypertension    Kidney stones    Migraine    MIGRAINE HEADACHE 04/05/2007   Qualifier: Diagnosis of  By: Drue Novel MD, Jose E.    Non-restorative sleep 03/12/2019   Sleep apnea    wears CPAP    Tobacco Use: Social History   Tobacco Use  Smoking Status Never  Smokeless Tobacco Never    Labs: Review Flowsheet  More data exists      Latest Ref Rng & Units 10/12/2021 12/17/2022 12/27/2022 01/04/2023 01/18/2023  Labs for ITP Cardiac and Pulmonary Rehab  Cholestrol 100 - 199 mg/dL - - - - 98   LDL (calc) 0 - 99 mg/dL - - - - 55   HDL-C >16 mg/dL - - - - 26   Trlycerides 0 - 149 mg/dL - - - - 81   Hemoglobin A1c 4.8 - 5.6 % 5.9  - - 5.8  -  PH, Arterial 7.35 - 7.45 -  7.364  7.334  7.364  7.313  7.410  7.286  7.228  7.403  7.331  - -  PCO2 arterial 32 - 48 mmHg - 40.5  42.2  37.7  44.0  34.9  50.3  58.2  38.5  43.1  - -  Bicarbonate 20.0 - 28.0 mmol/L - 24.1  24.2  23.1  22.5  21.5  22.8  22.1  23.9  24.3  24.8  24.0  22.8  - -  TCO2 22 - 32 mmol/L - 25  26  24  24  23  24  23  23  24  27  25  26  26  26  25  26  24  24   - -  Acid-base deficit 0.0 - 2.0 mmol/L - 2.0  2.0  2.0  3.0  4.0  4.0  2.0  3.0  3.0  1.0  1.0  3.0  - -  O2 Saturation % - 74  82  95  99  100  97  100  100  100  88  100  100  - -    Capillary Blood Glucose: Lab Results  Component Value Date   GLUCAP 99 02/23/2023   GLUCAP 129 (H) 02/23/2023   GLUCAP 105 (H) 02/21/2023   GLUCAP 118 (H) 02/21/2023   GLUCAP 219 (H) 01/09/2023     Exercise Target Goals: Exercise Program Goal: Individual exercise prescription set using results from initial 6 min walk test and THRR while considering  patient's activity barriers and safety.   Exercise Prescription Goal: Initial exercise prescription builds to 30-45 minutes a day of aerobic activity, 2-3 days per week.  Home exercise guidelines will be given to patient during program as part of exercise prescription that the participant will acknowledge.  Activity Barriers & Risk Stratification:  Activity Barriers & Cardiac Risk Stratification - 02/17/23 1309       Activity Barriers & Cardiac Risk Stratification   Activity Barriers Back Problems;Deconditioning;Muscular Weakness;Other (comment)    Comments Sternal Precautions    Cardiac Risk Stratification High             6 Minute Walk:  6 Minute Walk     Row Name 02/17/23 0908         6 Minute Walk   Phase Initial     Distance 1591 feet     Walk Time 6 minutes     # of Rest Breaks 0     MPH 3  METS 4.2     RPE 11     Perceived Dyspnea  0     VO2 Peak 14.7     Symptoms No     Resting HR 64 bpm     Resting BP 118/76     Resting Oxygen Saturation  99 %     Exercise Oxygen  Saturation  during 6 min walk 100 %     Max Ex. HR 77 bpm     Max Ex. BP 154/83     2 Minute Post BP 143/86              Oxygen Initial Assessment:   Oxygen Re-Evaluation:   Oxygen Discharge (Final Oxygen Re-Evaluation):   Initial Exercise Prescription:  Initial Exercise Prescription - 02/17/23 1300       Date of Initial Exercise RX and Referring Provider   Date 02/17/23    Referring Provider Carolan Clines, MD    Expected Discharge Date 04/29/23      Treadmill   MPH 2.7    Grade 1    Minutes 15    METs 3.44      Recumbant Bike   Level 2    RPM 60    Watts 50    Minutes 15    METs 3.43      Prescription Details   Frequency (times per week) 3    Duration Progress to 30 minutes of continuous aerobic without signs/symptoms of physical distress      Intensity   THRR 40-80% of Max Heartrate 70-140    Ratings of Perceived Exertion 11-13    Perceived Dyspnea 0-4      Progression   Progression Continue progressive overload as per policy without signs/symptoms or physical distress.      Resistance Training   Training Prescription Yes    Weight 4 lbs    Reps 10-15             Perform Capillary Blood Glucose checks as needed.  Exercise Prescription Changes:   Exercise Prescription Changes     Row Name 02/21/23 0829 03/16/23 0838 03/28/23 0800         Response to Exercise   Blood Pressure (Admit) 130/64 122/64 106/58     Blood Pressure (Exercise) 140/70 130/66 128/62     Blood Pressure (Exit) 134/66 112/60 118/56     Heart Rate (Admit) 84 bpm 62 bpm 64 bpm     Heart Rate (Exercise) 90 bpm 90 bpm 89 bpm     Heart Rate (Exit) 66 bpm 68 bpm 59 bpm     Rating of Perceived Exertion (Exercise) 12.5 13.5 12     Perceived Dyspnea (Exercise) 0 0 0     Symptoms 0 0 0     Comments Pr first day in the [facility] ICR CRP2 program Reviewed MET's, goals and home ExRx Reviewed METs     Duration Progress to 30 minutes of  aerobic without signs/symptoms of physical  distress Progress to 30 minutes of  aerobic without signs/symptoms of physical distress Progress to 30 minutes of  aerobic without signs/symptoms of physical distress     Intensity THRR unchanged THRR unchanged THRR unchanged       Progression   Progression Continue to progress workloads to maintain intensity without signs/symptoms of physical distress. Continue to progress workloads to maintain intensity without signs/symptoms of physical distress. Continue to progress workloads to maintain intensity without signs/symptoms of physical distress.     Average METs 3.47  3.96 4.06       Resistance Training   Training Prescription Yes No Yes     Weight 4 lbs -- 4     Reps 10-15 -- 10-15     Time 10 Minutes -- 10 Minutes       Treadmill   MPH 2.7 2.9 2.9     Grade 1 1 1      Minutes 15 15 15      METs 3.44 3.62 3.62       Recumbant Bike   Level 2 3 3.3     RPM 77 80 --     Watts 35 53 --     Minutes 15 15 15      METs 3.5 4.3 4.5       Home Exercise Plan   Plans to continue exercise at -- Home (comment) --     Frequency -- Add 2 additional days to program exercise sessions. --     Initial Home Exercises Provided -- 03/16/23 --              Exercise Comments:   Exercise Comments     Row Name 02/21/23 0840 03/16/23 0845 03/28/23 0837       Exercise Comments Pt first day in the [facility] ICR program. Pt tolerated exercise well with an average MET level of 3.47. Pt is learning his THRR, RPE and ExRx. Off to a good start Reviewed MET's, goals and home ExRx. Pt tolerated exercise well with an average MET level of 3.96. Pt will adding in walking for exercise 1-2 days for 30-45 mins per session. Pt had been feeling better overall since surgery and is increase his stamina over time. Reviewed METs with pt today. Pt is tolerating an avg MET level of 4.06 well. Pt feels good about his progress. He is happy with his speed on the treadmill and is interested in beginning to add an incline to  increase his workload. Will continue to monitor and progress workloads as tolerated without s/sx.              Exercise Goals and Review:   Exercise Goals     Row Name 02/17/23 1310             Exercise Goals   Increase Physical Activity Yes       Intervention Provide advice, education, support and counseling about physical activity/exercise needs.;Develop an individualized exercise prescription for aerobic and resistive training based on initial evaluation findings, risk stratification, comorbidities and participant's personal goals.       Expected Outcomes Short Term: Attend rehab on a regular basis to increase amount of physical activity.;Long Term: Add in home exercise to make exercise part of routine and to increase amount of physical activity.;Long Term: Exercising regularly at least 3-5 days a week.       Increase Strength and Stamina Yes       Intervention Provide advice, education, support and counseling about physical activity/exercise needs.;Develop an individualized exercise prescription for aerobic and resistive training based on initial evaluation findings, risk stratification, comorbidities and participant's personal goals.       Expected Outcomes Short Term: Increase workloads from initial exercise prescription for resistance, speed, and METs.;Short Term: Perform resistance training exercises routinely during rehab and add in resistance training at home;Long Term: Improve cardiorespiratory fitness, muscular endurance and strength as measured by increased METs and functional capacity ( )       Able to understand and use rate of perceived exertion (RPE) scale  Yes       Intervention Provide education and explanation on how to use RPE scale       Expected Outcomes Short Term: Able to use RPE daily in rehab to express subjective intensity level;Long Term:  Able to use RPE to guide intensity level when exercising independently       Knowledge and understanding of Target Heart  Rate Range (THRR) Yes       Intervention Provide education and explanation of THRR including how the numbers were predicted and where they are located for reference       Expected Outcomes Short Term: Able to state/look up THRR;Short Term: Able to use daily as guideline for intensity in rehab;Long Term: Able to use THRR to govern intensity when exercising independently       Understanding of Exercise Prescription Yes       Intervention Provide education, explanation, and written materials on patient's individual exercise prescription       Expected Outcomes Short Term: Able to explain program exercise prescription;Long Term: Able to explain home exercise prescription to exercise independently                Exercise Goals Re-Evaluation :  Exercise Goals Re-Evaluation     Row Name 02/21/23 0836 03/16/23 0841           Exercise Goal Re-Evaluation   Exercise Goals Review Increase Physical Activity;Understanding of Exercise Prescription;Increase Strength and Stamina;Knowledge and understanding of Target Heart Rate Range (THRR);Able to understand and use rate of perceived exertion (RPE) scale Increase Physical Activity;Understanding of Exercise Prescription;Increase Strength and Stamina;Knowledge and understanding of Target Heart Rate Range (THRR);Able to understand and use rate of perceived exertion (RPE) scale      Comments Pt first day in the [facility] ICR program. Pt tolerated exercise well with an average MET level of 3.47. Pt is learning his THRR, RPE and ExRx. Off to a good start Reviewed MET's, goals and home ExRx. Pt tolerated exercise well with an average MET level of 3.96. Pt will adding in walking for exercise 1-2 days for 30-45 mins per session. Pt had been feeling better overall since surgery and is increase his stamina over time.      Expected Outcomes Will continue to monitor pt and progress workloads as tolerated without sign or symptom Will continue to monitor pt and progress  workloads as tolerated without sign or symptom               Discharge Exercise Prescription (Final Exercise Prescription Changes):  Exercise Prescription Changes - 03/28/23 0800       Response to Exercise   Blood Pressure (Admit) 106/58    Blood Pressure (Exercise) 128/62    Blood Pressure (Exit) 118/56    Heart Rate (Admit) 64 bpm    Heart Rate (Exercise) 89 bpm    Heart Rate (Exit) 59 bpm    Rating of Perceived Exertion (Exercise) 12    Perceived Dyspnea (Exercise) 0    Symptoms 0    Comments Reviewed METs    Duration Progress to 30 minutes of  aerobic without signs/symptoms of physical distress    Intensity THRR unchanged      Progression   Progression Continue to progress workloads to maintain intensity without signs/symptoms of physical distress.    Average METs 4.06      Resistance Training   Training Prescription Yes    Weight 4    Reps 10-15    Time 10 Minutes  Treadmill   MPH 2.9    Grade 1    Minutes 15    METs 3.62      Recumbant Bike   Level 3.3    Minutes 15    METs 4.5             Nutrition:  Target Goals: Understanding of nutrition guidelines, daily intake of sodium 1500mg , cholesterol 200mg , calories 30% from fat and 7% or less from saturated fats, daily to have 5 or more servings of fruits and vegetables.  Biometrics:  Pre Biometrics - 02/17/23 0825       Pre Biometrics   Waist Circumference 48.5 inches    Hip Circumference 45 inches    Waist to Hip Ratio 1.08 %    Triceps Skinfold 25 mm    % Body Fat 35.9 %    Grip Strength 38 kg    Flexibility 11.25 in    Single Leg Stand 30 seconds              Nutrition Therapy Plan and Nutrition Goals:  Nutrition Therapy & Goals - 03/21/23 0950       Nutrition Therapy   Diet Heart Healthy/Carbohydrate Consistent Diet    Drug/Food Interactions Statins/Certain Fruits;Coumadin/Vit K      Personal Nutrition Goals   Nutrition Goal Patient to identify strategies for reducing  cardiovascular risk by attending the [facility] education and nutrition series weekly.    Personal Goal #2 Patient to improve diet quality by using the plate method as a guide for meal planning to include lean protein/plant protein, fruits, vegetables, whole grains, nonfat dairy as part of a well-balanced diet.    Personal Goal #3 Patient to identify food sources and limit daily intake of saturated fat, trans fat, sodium, and refined carbohydrates.    Comments Goals in action. Victor continues to attend the [employer] and nutrition series regularly. He has started making many changes including introduced many Pritikin recipes, reduced sodium intake, increased high fiber foods. He does continue to eat out often related to his son's travel baseball schedule; gave resources and disussed healthier choices when out to eat. Tarren has medical history of elevated triglycerides, WNL at this time and continues fenofibrate. A1c improved to 5.9. Patient will continue to benefit from participation in intensive cardiac rehab for nutrition, exercise, and lifestyle modfication.      Intervention Plan   Intervention Prescribe, educate and counsel regarding individualized specific dietary modifications aiming towards targeted core components such as weight, hypertension, lipid management, diabetes, heart failure and other comorbidities.;Nutrition handout(s) given to patient.    Expected Outcomes Short Term Goal: Understand basic principles of dietary content, such as calories, fat, sodium, cholesterol and nutrients.;Long Term Goal: Adherence to prescribed nutrition plan.             Nutrition Assessments:  MEDIFICTS Score Key: ?70 Need to make dietary changes  40-70 Heart Healthy Diet ? 40 Therapeutic Level Cholesterol Diet    Picture Your Plate Scores: <16 Unhealthy dietary pattern with much room for improvement. 41-50 Dietary pattern unlikely to meet recommendations for good health and room for  improvement. 51-60 More healthful dietary pattern, with some room for improvement.  >60 Healthy dietary pattern, although there may be some specific behaviors that could be improved.    Nutrition Goals Re-Evaluation:  Nutrition Goals Re-Evaluation     Row Name 02/21/23 0833 03/21/23 0950           Goals   Current Weight 238 lb  12.1 oz (108.3 kg) 242 lb 8.1 oz (110 kg)      Comment Lipids improved-HDL 26, lipoproteinA WNL, Z6X 5.8- has been as high at 14 in the last 4 years. No new labs at this time; most recent labs  Lipids improved-HDL 26, lipoproteinA WNL, W9U 5.8- has been as high at 14 in the last 4 years.      Expected Outcome Antuan has medical history of elevated triglycerides, WNL at this time and continues fenofibrate. A1c improved to 5.9. Patient will continue to benefit from participation in intensive cardiac rehab for nutrition, exercise, and lifestyle modfication. Goals in action. Eriq continues to attend the [employer] and nutrition series regularly. He has started making many changes including introduced many Pritikin recipes, reduced sodium intake, increased high fiber foods. He does continue to eat out often related to his son's travel baseball schedule; gave resources and disussed healthier choices when out to eat. Apolinar has medical history of elevated triglycerides, WNL at this time and continues fenofibrate. A1c improved to 5.9. Patient will continue to benefit from participation in intensive cardiac rehab for nutrition, exercise, and lifestyle modfication.               Nutrition Goals Re-Evaluation:  Nutrition Goals Re-Evaluation     Row Name 02/21/23 0833 03/21/23 0950           Goals   Current Weight 238 lb 12.1 oz (108.3 kg) 242 lb 8.1 oz (110 kg)      Comment Lipids improved-HDL 26, lipoproteinA WNL, E4V 5.8- has been as high at 14 in the last 4 years. No new labs at this time; most recent labs  Lipids improved-HDL 26, lipoproteinA WNL, W0J  5.8- has been as high at 14 in the last 4 years.      Expected Outcome Dozier has medical history of elevated triglycerides, WNL at this time and continues fenofibrate. A1c improved to 5.9. Patient will continue to benefit from participation in intensive cardiac rehab for nutrition, exercise, and lifestyle modfication. Goals in action. Jawad continues to attend the [employer] and nutrition series regularly. He has started making many changes including introduced many Pritikin recipes, reduced sodium intake, increased high fiber foods. He does continue to eat out often related to his son's travel baseball schedule; gave resources and disussed healthier choices when out to eat. Ever has medical history of elevated triglycerides, WNL at this time and continues fenofibrate. A1c improved to 5.9. Patient will continue to benefit from participation in intensive cardiac rehab for nutrition, exercise, and lifestyle modfication.               Nutrition Goals Discharge (Final Nutrition Goals Re-Evaluation):  Nutrition Goals Re-Evaluation - 03/21/23 0950       Goals   Current Weight 242 lb 8.1 oz (110 kg)    Comment No new labs at this time; most recent labs  Lipids improved-HDL 26, lipoproteinA WNL, W1X 5.8- has been as high at 14 in the last 4 years.    Expected Outcome Goals in action. Axcel continues to attend the [employer] and nutrition series regularly. He has started making many changes including introduced many Pritikin recipes, reduced sodium intake, increased high fiber foods. He does continue to eat out often related to his son's travel baseball schedule; gave resources and disussed healthier choices when out to eat. Clarice has medical history of elevated triglycerides, WNL at this time and continues fenofibrate. A1c improved to 5.9. Patient will continue to benefit  from participation in intensive cardiac rehab for nutrition, exercise, and lifestyle modfication.              Psychosocial: Target Goals: Acknowledge presence or absence of significant depression and/or stress, maximize coping skills, provide positive support system. Participant is able to verbalize types and ability to use techniques and skills needed for reducing stress and depression.  Initial Review & Psychosocial Screening:  Initial Psych Review & Screening - 02/17/23 1321       Initial Review   Current issues with None Identified      Family Dynamics   Good Support System? Yes    Comments Pt has his spouse for support      Barriers   Psychosocial barriers to participate in program There are no identifiable barriers or psychosocial needs.      Screening Interventions   Interventions Encouraged to exercise             Quality of Life Scores:  Quality of Life - 02/17/23 1324       Quality of Life   Select Quality of Life      Quality of Life Scores   Health/Function Pre 20.73 %    Socioeconomic Pre 23.93 %    Psych/Spiritual Pre 18.64 %    Family Pre 24 %    GLOBAL Pre 21.44 %            Scores of 19 and below usually indicate a poorer quality of life in these areas.  A difference of  2-3 points is a clinically meaningful difference.  A difference of 2-3 points in the total score of the Quality of Life Index has been associated with significant improvement in overall quality of life, self-image, physical symptoms, and general health in studies assessing change in quality of life.  PHQ-9: Review Flowsheet       02/17/2023 10/07/2021 02/01/2019  Depression screen PHQ 2/9  Decreased Interest 0 0 0  Down, Depressed, Hopeless 0 1 0  PHQ - 2 Score 0 1 0  Altered sleeping 0 0 -  Tired, decreased energy 1 0 -  Change in appetite 0 0 -  Feeling bad or failure about yourself  0 1 -  Trouble concentrating 0 0 -  Moving slowly or fidgety/restless 0 0 -  Suicidal thoughts 0 0 -  PHQ-9 Score 1 2 -  Difficult doing work/chores Not difficult at all Not difficult at  all -   Interpretation of Total Score  Total Score Depression Severity:  1-4 = Minimal depression, 5-9 = Mild depression, 10-14 = Moderate depression, 15-19 = Moderately severe depression, 20-27 = Severe depression   Psychosocial Evaluation and Intervention:   Psychosocial Re-Evaluation:  Psychosocial Re-Evaluation     Row Name 02/28/23 1735 03/28/23 0840           Psychosocial Re-Evaluation   Current issues with None Identified None Identified      Interventions Encouraged to attend Cardiac Rehabilitation for the exercise Encouraged to attend Cardiac Rehabilitation for the exercise      Continue Psychosocial Services  No Follow up required No Follow up required               Psychosocial Discharge (Final Psychosocial Re-Evaluation):  Psychosocial Re-Evaluation - 03/28/23 0840       Psychosocial Re-Evaluation   Current issues with None Identified    Interventions Encouraged to attend Cardiac Rehabilitation for the exercise    Continue Psychosocial Services  No Follow up required  Vocational Rehabilitation: Provide vocational rehab assistance to qualifying candidates.   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 Pt has no vocational needs at this time.             Education: Education Goals: Education classes will be provided on a weekly basis, covering required topics. Participant will state understanding/return demonstration of topics presented.    Education     Row Name 02/21/23 1200     Education   Cardiac Education Topics Pritikin   Select Workshops     Workshops   Educator Exercise Physiologist   Select Exercise   Exercise Workshop Location manager and Fall Prevention   Instruction Review Code 1-  Verbalizes Understanding   Class Start Time 0815   Class Stop Time 0855   Class Time Calculation (min) 40 min    Row Name 02/23/23 1000     Education   Cardiac Education Topics Pritikin   Orthoptist   Educator Dietitian   Weekly Topic Fast and Healthy Breakfasts   Instruction Review Code 1- Verbalizes Understanding   Class Start Time 0813   Class Stop Time 0856   Class Time Calculation (min) 43 min    Row Name 02/25/23 0900     Education   Cardiac Education Topics Pritikin   Select Core Videos     Core Videos   Educator Dietitian   Select Nutrition   Nutrition Overview of the Pritikin Eating Plan   Instruction Review Code 1- Verbalizes Understanding   Class Start Time 0815   Class Stop Time 0905   Class Time Calculation (min) 50 min    Row Name 03/02/23 1000     Education   Cardiac Education Topics Pritikin   Secondary school teacher School   Educator Dietitian   Weekly Topic Personalizing Your Pritikin Plate   Instruction Review Code 1- Verbalizes Understanding   Class Start Time [phone]   Class Stop Time 0848   Class Time Calculation (min) 38 min    Row Name 03/14/23 0900     Education   Cardiac Education Topics Pritikin   Select Workshops     Workshops   Educator Exercise Physiologist   Select Exercise   Exercise Workshop Exercise Basics: Building Your Action Plan   Instruction Review Code 1- Verbalizes Understanding   Class Start Time 0815   Class Stop Time 0903   Class Time Calculation (min) 48 min    Row Name 03/16/23 0900     Education   Cardiac Education Topics Pritikin   Secondary school teacher School   Educator Dietitian   Weekly Topic Tasty Appetizers and Snacks   Instruction Review Code 1- Verbalizes Understanding   Class Start Time 0813   Class Stop Time 0845   Class Time Calculation (min) 32 min    Row Name 03/18/23 0900     Education   Cardiac Education Topics Pritikin    Select Core Videos     Core Videos   Educator Dietitian   Select Nutrition   Nutrition Calorie Density   Instruction Review Code 1- Verbalizes Understanding   Class Start Time 0815   Class Stop Time  1610   Class Time Calculation (min) 41 min    Row Name 03/21/23 0900     Education   Cardiac Education Topics Pritikin   Select Core Videos     Core Videos   Educator Dietitian   Select Nutrition   Nutrition Nutrition Action Plan   Instruction Review Code 1- Verbalizes Understanding   Class Start Time 0813   Class Stop Time [phone]   Class Time Calculation (min) 39 min    Row Name 03/23/23 0900     Education   Cardiac Education Topics Pritikin   Secondary school teacher School   Educator Dietitian   Weekly Topic Efficiency Cooking - Meals in a Snap   Instruction Review Code 1- Verbalizes Understanding   Class Start Time 0815   Class Stop Time 0849   Class Time Calculation (min) 34 min    Row Name 03/25/23 0900     Education   Cardiac Education Topics Pritikin   Select Core Videos     Core Videos   Educator Exercise Physiologist   Select Exercise Education   Exercise Education Move It!   Instruction Review Code 1- Verbalizes Understanding   Class Start Time [phone]   Class Stop Time 0858   Class Time Calculation (min) 42 min            Core Videos: Exercise    Move It!  Clinical staff conducted group or individual video education with verbal and written material and guidebook.  Patient learns the recommended Pritikin exercise program. Exercise with the goal of living a long, healthy life. Some of the health benefits of exercise include controlled diabetes, healthier blood pressure levels, improved cholesterol levels, improved heart and lung capacity, improved sleep, and better body composition. Everyone should speak with their doctor before starting or changing an exercise routine.  Biomechanical Limitations Clinical staff conducted group or individual  video education with verbal and written material and guidebook.  Patient learns how biomechanical limitations can impact exercise and how we can mitigate and possibly overcome limitations to have an impactful and balanced exercise routine.  Body Composition Clinical staff conducted group or individual video education with verbal and written material and guidebook.  Patient learns that body composition (ratio of muscle mass to fat mass) is a key component to assessing overall fitness, rather than body weight alone. Increased fat mass, especially visceral belly fat, can put [place] at increased risk for metabolic syndrome, type 2 diabetes, heart disease, and even death. It is recommended to combine diet and exercise (cardiovascular and resistance training) to improve your body composition. Seek guidance from your physician and exercise physiologist before implementing an exercise routine.  Exercise Action Plan Clinical staff conducted group or individual video education with verbal and written material and guidebook.  Patient learns the recommended strategies to achieve and enjoy long-term exercise adherence, including variety, self-motivation, self-efficacy, and positive decision making. Benefits of exercise include fitness, good health, weight management, more energy, better sleep, less stress, and overall well-being.  Medical   Heart Disease Risk Reduction Clinical staff conducted group or individual video education with verbal and written material and guidebook.  Patient learns our heart is our most vital organ as it circulates oxygen, nutrients, white blood cells, and hormones throughout the entire body, and carries waste away. Data supports a plant-based eating plan like the Pritikin Program for its effectiveness in slowing progression of and reversing heart disease. The video provides a number of recommendations to  address heart disease.   Metabolic Syndrome and Belly Fat  Clinical staff conducted  group or individual video education with verbal and written material and guidebook.  Patient learns what metabolic syndrome is, how it leads to heart disease, and how one can reverse it and keep it from coming back. You have metabolic syndrome if you have 3 of the following 5 criteria: abdominal obesity, high blood pressure, high triglycerides, low HDL cholesterol, and high blood sugar.  Hypertension and Heart Disease Clinical staff conducted group or individual video education with verbal and written material and guidebook.  Patient learns that high blood pressure, or hypertension, is very common in the [place]. Hypertension is largely due to excessive salt intake, but other important risk factors include being overweight, physical inactivity, drinking too much alcohol, smoking, and not eating enough potassium from fruits and vegetables. High blood pressure is a leading risk factor for heart attack, stroke, congestive heart failure, dementia, kidney failure, and premature death. Long-term effects of excessive salt intake include stiffening of the arteries and thickening of heart muscle and organ damage. Recommendations include ways to reduce hypertension and the risk of heart disease.  Diseases of Our Time - Focusing on Diabetes Clinical staff conducted group or individual video education with verbal and written material and guidebook.  Patient learns why the best way to stop diseases of our time is prevention, through food and other lifestyle changes. Medicine (such as prescription pills and surgeries) is often only a Band-Aid on the problem, not a long-term solution. Most common diseases of our time include obesity, type 2 diabetes, hypertension, heart disease, and cancer. The Pritikin Program is recommended and has been proven to help reduce, reverse, and/or prevent the damaging effects of metabolic syndrome.  Nutrition   Overview of the Pritikin Eating Plan  Clinical staff conducted group or  individual video education with verbal and written material and guidebook.  Patient learns about the Pritikin Eating Plan for disease risk reduction. The Pritikin Eating Plan emphasizes a wide variety of unrefined, minimally-processed carbohydrates, like fruits, vegetables, whole grains, and legumes. Go, Caution, and Stop food choices are explained. Plant-based and lean animal proteins are emphasized. Rationale provided for low sodium intake for blood pressure control, low added sugars for blood sugar stabilization, and low added fats and oils for coronary artery disease risk reduction and weight management.  Calorie Density  Clinical staff conducted group or individual video education with verbal and written material and guidebook.  Patient learns about calorie density and how it impacts the Pritikin Eating Plan. Knowing the characteristics of the food you choose will help you decide whether those foods will lead to weight gain or weight loss, and whether you want to consume more or less of them. Weight loss is usually a side effect of the Pritikin Eating Plan because of its focus on low calorie-dense foods.  Label Reading  Clinical staff conducted group or individual video education with verbal and written material and guidebook.  Patient learns about the Pritikin recommended label reading guidelines and corresponding recommendations regarding calorie density, added sugars, sodium content, and whole grains.  Dining Out - Part 1  Clinical staff conducted group or individual video education with verbal and written material and guidebook.  Patient learns that restaurant meals can be sabotaging because they can be so high in calories, fat, sodium, and/or sugar. Patient learns recommended strategies on how to positively address this and avoid unhealthy pitfalls.  Facts on Fats  Clinical staff conducted group  or individual video education with verbal and written material and guidebook.  Patient learns  that lifestyle modifications can be just as effective, if not more so, as many medications for lowering your risk of heart disease. A Pritikin lifestyle can help to reduce your risk of inflammation and atherosclerosis (cholesterol build-up, or plaque, in the artery walls). Lifestyle interventions such as dietary choices and physical activity address the cause of atherosclerosis. A review of the types of fats and their impact on blood cholesterol levels, along with dietary recommendations to reduce fat intake is also included.  Nutrition Action Plan  Clinical staff conducted group or individual video education with verbal and written material and guidebook.  Patient learns how to incorporate Pritikin recommendations into their lifestyle. Recommendations include planning and keeping personal health goals in mind as an important part of their success.  Healthy Mind-Set    Healthy Minds, Bodies, Hearts  Clinical staff conducted group or individual video education with verbal and written material and guidebook.  Patient learns how to identify when they are stressed. Video will discuss the impact of that stress, as well as the many benefits of stress management. Patient will also be introduced to stress management techniques. The way we think, act, and feel has an impact on our hearts.  How Our Thoughts Can Heal Our Hearts  Clinical staff conducted group or individual video education with verbal and written material and guidebook.  Patient learns that negative thoughts can cause depression and anxiety. This can result in negative lifestyle behavior and serious health problems. Cognitive behavioral therapy is an effective method to help control our thoughts in order to change and improve our emotional outlook.  Additional Videos:  Exercise    Improving Performance  Clinical staff conducted group or individual video education with verbal and written material and guidebook.  Patient learns to use a  non-linear approach by alternating intensity levels and lengths of time spent exercising to help burn more calories and lose more body fat. Cardiovascular exercise helps improve heart health, metabolism, hormonal balance, blood sugar control, and recovery from fatigue. Resistance training improves strength, endurance, balance, coordination, reaction time, metabolism, and muscle mass. Flexibility exercise improves circulation, posture, and balance. Seek guidance from your physician and exercise physiologist before implementing an exercise routine and learn your capabilities and proper form for all exercise.  Introduction to Yoga  Clinical staff conducted group or individual video education with verbal and written material and guidebook.  Patient learns about yoga, a discipline of the coming together of mind, breath, and body. The benefits of yoga include improved flexibility, improved range of motion, better posture and core strength, increased lung function, weight loss, and positive self-image. Yoga's heart health benefits include lowered blood pressure, healthier heart rate, decreased cholesterol and triglyceride levels, improved immune function, and reduced stress. Seek guidance from your physician and exercise physiologist before implementing an exercise routine and learn your capabilities and proper form for all exercise.  Medical   Aging: Enhancing Your Quality of Life  Clinical staff conducted group or individual video education with verbal and written material and guidebook.  Patient learns key strategies and recommendations to stay in good physical health and enhance quality of life, such as prevention strategies, having an advocate, securing a Health Care Proxy and Power of Attorney, and keeping a list of medications and system for tracking them. It also discusses how to avoid risk for bone loss.  Biology of Weight Control  Clinical staff conducted group or individual video  education with  verbal and written material and guidebook.  Patient learns that weight gain occurs because we consume more calories than we burn (eating more, moving less). Even if your body weight is normal, you may have higher ratios of fat compared to muscle mass. Too much body fat puts you at increased risk for cardiovascular disease, heart attack, stroke, type 2 diabetes, and obesity-related cancers. In addition to exercise, following the Pritikin Eating Plan can help reduce your risk.  Decoding Lab Results  Clinical staff conducted group or individual video education with verbal and written material and guidebook.  Patient learns that lab test reflects one measurement whose values change over time and are influenced by many factors, including medication, stress, sleep, exercise, food, hydration, pre-existing medical conditions, and more. It is recommended to use the knowledge from this video to become more involved with your lab results and evaluate your numbers to speak with your doctor.   Diseases of Our Time - Overview  Clinical staff conducted group or individual video education with verbal and written material and guidebook.  Patient learns that according to the CDC, 50% to 70% of chronic diseases (such as obesity, type 2 diabetes, elevated lipids, hypertension, and heart disease) are avoidable through lifestyle improvements including healthier food choices, listening to satiety cues, and increased physical activity.  Sleep Disorders Clinical staff conducted group or individual video education with verbal and written material and guidebook.  Patient learns how good quality and duration of sleep are important to overall health and well-being. Patient also learns about sleep disorders and how they impact health along with recommendations to address them, including discussing with a physician.  Nutrition  Dining Out - Part 2 Clinical staff conducted group or individual video education with verbal and  written material and guidebook.  Patient learns how to plan ahead and communicate in order to maximize their dining experience in a healthy and nutritious manner. Included are recommended food choices based on the type of restaurant the patient is visiting.   Fueling a Banker conducted group or individual video education with verbal and written material and guidebook.  There is a strong connection between our food choices and our health. Diseases like obesity and type 2 diabetes are very prevalent and are in large-part due to lifestyle choices. The Pritikin Eating Plan provides plenty of food and hunger-curbing satisfaction. It is easy to follow, affordable, and helps reduce health risks.  Menu Workshop  Clinical staff conducted group or individual video education with verbal and written material and guidebook.  Patient learns that restaurant meals can sabotage health goals because they are often packed with calories, fat, sodium, and sugar. Recommendations include strategies to plan ahead and to communicate with the manager, chef, or server to help order a healthier meal.  Planning Your Eating Strategy  Clinical staff conducted group or individual video education with verbal and written material and guidebook.  Patient learns about the Pritikin Eating Plan and its benefit of reducing the risk of disease. The Pritikin Eating Plan does not focus on calories. Instead, it emphasizes high-quality, nutrient-rich foods. By knowing the characteristics of the foods, we choose, we can determine their calorie density and make informed decisions.  Targeting Your Nutrition Priorities  Clinical staff conducted group or individual video education with verbal and written material and guidebook.  Patient learns that lifestyle habits have a tremendous impact on disease risk and progression. This video provides eating and physical activity recommendations based on your  personal health goals,  such as reducing LDL cholesterol, losing weight, preventing or controlling type 2 diabetes, and reducing high blood pressure.  Vitamins and Minerals  Clinical staff conducted group or individual video education with verbal and written material and guidebook.  Patient learns different ways to obtain key vitamins and minerals, including through a recommended healthy diet. It is important to discuss all supplements you take with your doctor.   Healthy Mind-Set    Smoking Cessation  Clinical staff conducted group or individual video education with verbal and written material and guidebook.  Patient learns that cigarette smoking and tobacco addiction pose a serious health risk which affects millions of people. Stopping smoking will significantly reduce the risk of heart disease, lung disease, and many forms of cancer. Recommended strategies for quitting are covered, including working with your doctor to develop a successful plan.  Culinary   Becoming a Set designer conducted group or individual video education with verbal and written material and guidebook.  Patient learns that cooking at home can be healthy, cost-effective, quick, and puts them in control. Keys to cooking healthy recipes will include looking at your recipe, assessing your equipment needs, planning ahead, making it simple, choosing cost-effective seasonal ingredients, and limiting the use of added fats, salts, and sugars.  Cooking - Breakfast and Snacks  Clinical staff conducted group or individual video education with verbal and written material and guidebook.  Patient learns how important breakfast is to satiety and nutrition through the entire day. Recommendations include key foods to eat during breakfast to help stabilize blood sugar levels and to prevent overeating at meals later in the day. Planning ahead is also a key component.  Cooking - Educational psychologist conducted group or individual video  education with verbal and written material and guidebook.  Patient learns eating strategies to improve overall health, including an approach to cook more at home. Recommendations include thinking of animal protein as a side on your plate rather than center stage and focusing instead on lower calorie dense options like vegetables, fruits, whole grains, and plant-based proteins, such as beans. Making sauces in large quantities to freeze for later and leaving the skin on your vegetables are also recommended to maximize your experience.  Cooking - Healthy Salads and Dressing Clinical staff conducted group or individual video education with verbal and written material and guidebook.  Patient learns that vegetables, fruits, whole grains, and legumes are the foundations of the Pritikin Eating Plan. Recommendations include how to incorporate each of these in flavorful and healthy salads, and how to create homemade salad dressings. Proper handling of ingredients is also covered. Cooking - Soups and [employer] - Soups and Desserts Clinical staff conducted group or individual video education with verbal and written material and guidebook.  Patient learns that Pritikin soups and desserts make for easy, nutritious, and delicious snacks and meal components that are low in sodium, fat, sugar, and calorie density, while high in vitamins, minerals, and filling fiber. Recommendations include simple and healthy ideas for soups and desserts.   Overview     The Pritikin Solution Program Overview Clinical staff conducted group or individual video education with verbal and written material and guidebook.  Patient learns that the results of the Pritikin Program have been documented in more than 100 articles published in peer-reviewed journals, and the benefits include reducing risk factors for (and, in some cases, even reversing) high cholesterol, high blood pressure, type 2 diabetes, obesity,  and more! An overview of  the three key pillars of the Pritikin Program will be covered: eating well, doing regular exercise, and having a healthy mind-set.  WORKSHOPS  Exercise: Exercise Basics: Building Your Action Plan Clinical staff led group instruction and group discussion with PowerPoint presentation and patient guidebook. To enhance the learning environment the use of posters, models and videos may be added. At the conclusion of this workshop, patients will comprehend the difference between physical activity and exercise, as well as the benefits of incorporating both, into their routine. Patients will understand the FITT (Frequency, Intensity, Time, and Type) principle and how to use it to build an exercise action plan. In addition, safety concerns and other considerations for exercise and cardiac rehab will be addressed by the presenter. The purpose of this lesson is to promote a comprehensive and effective weekly exercise routine in order to improve patients' overall level of fitness.   Managing Heart Disease: Your Path to a Healthier Heart Clinical staff led group instruction and group discussion with PowerPoint presentation and patient guidebook. To enhance the learning environment the use of posters, models and videos may be added.At the conclusion of this workshop, patients will understand the anatomy and physiology of the heart. Additionally, they will understand how Pritikin's three pillars impact the risk factors, the progression, and the management of heart disease.  The purpose of this lesson is to provide a high-level overview of the heart, heart disease, and how the Pritikin lifestyle positively impacts risk factors.  Exercise Biomechanics Clinical staff led group instruction and group discussion with PowerPoint presentation and patient guidebook. To enhance the learning environment the use of posters, models and videos may be added. Patients will learn how the structural parts of their bodies  function and how these functions impact their daily activities, movement, and exercise. Patients will learn how to promote a neutral spine, learn how to manage pain, and identify ways to improve their physical movement in order to promote healthy living. The purpose of this lesson is to expose patients to common physical limitations that impact physical activity. Participants will learn practical ways to adapt and manage aches and pains, and to minimize their effect on regular exercise. Patients will learn how to maintain good posture while sitting, walking, and lifting.  Balance Training and Fall Prevention  Clinical staff led group instruction and group discussion with PowerPoint presentation and patient guidebook. To enhance the learning environment the use of posters, models and videos may be added. At the conclusion of this workshop, patients will understand the importance of their sensorimotor skills (vision, proprioception, and the vestibular system) in maintaining their ability to balance as they age. Patients will apply a variety of balancing exercises that are appropriate for their current level of function. Patients will understand the common causes for poor balance, possible solutions to these problems, and ways to modify their physical environment in order to minimize their fall risk. The purpose of this lesson is to teach patients about the importance of maintaining balance as they age and ways to minimize their risk of falling.  WORKSHOPS   Nutrition:  Fueling a Ship broker led group instruction and group discussion with PowerPoint presentation and patient guidebook. To enhance the learning environment the use of posters, models and videos may be added. Patients will review the foundational principles of the Pritikin Eating Plan and understand what constitutes a serving size in each of the food groups. Patients will also learn Pritikin-friendly foods that are better  choices when away from home and review make-ahead meal and snack options. Calorie density will be reviewed and applied to three nutrition priorities: weight maintenance, weight loss, and weight gain. The purpose of this lesson is to reinforce (in a group setting) the key concepts around what patients are recommended to eat and how to apply these guidelines when away from home by planning and selecting Pritikin-friendly options. Patients will understand how calorie density may be adjusted for different weight management goals.  Mindful Eating  Clinical staff led group instruction and group discussion with PowerPoint presentation and patient guidebook. To enhance the learning environment the use of posters, models and videos may be added. Patients will briefly review the concepts of the Pritikin Eating Plan and the importance of low-calorie dense foods. The concept of mindful eating will be introduced as well as the importance of paying attention to internal hunger signals. Triggers for non-hunger eating and techniques for dealing with triggers will be explored. The purpose of this lesson is to provide patients with the opportunity to review the basic principles of the Pritikin Eating Plan, discuss the value of eating mindfully and how to measure internal cues of hunger and fullness using the Hunger Scale. Patients will also discuss reasons for non-hunger eating and learn strategies to use for controlling emotional eating.  Targeting Your Nutrition Priorities Clinical staff led group instruction and group discussion with PowerPoint presentation and patient guidebook. To enhance the learning environment the use of posters, models and videos may be added. Patients will learn how to determine their genetic susceptibility to disease by reviewing their family history. Patients will gain insight into the importance of diet as part of an overall healthy lifestyle in mitigating the impact of genetics and other  environmental insults. The purpose of this lesson is to provide patients with the opportunity to assess their personal nutrition priorities by looking at their family history, their own health history and current risk factors. Patients will also be able to discuss ways of prioritizing and modifying the Pritikin Eating Plan for their highest risk areas  Menu  Clinical staff led group instruction and group discussion with PowerPoint presentation and patient guidebook. To enhance the learning environment the use of posters, models and videos may be added. Using menus brought in from [employer], or printed from [employer], patients will apply the Pritikin dining out guidelines that were presented in the [employer] video. Patients will also be able to practice these guidelines in a variety of provided scenarios. The purpose of this lesson is to provide patients with the opportunity to practice hands-on learning of the Pritikin Dining Out guidelines with actual menus and practice scenarios.  Label Reading Clinical staff led group instruction and group discussion with PowerPoint presentation and patient guidebook. To enhance the learning environment the use of posters, models and videos may be added. Patients will review and discuss the Pritikin label reading guidelines presented in Pritikin's Label Reading Educational series video. Using fool labels brought in from local grocery stores and markets, patients will apply the label reading guidelines and determine if the packaged food meet the Pritikin guidelines. The purpose of this lesson is to provide patients with the opportunity to review, discuss, and practice hands-on learning of the Pritikin Label Reading guidelines with actual packaged food labels. Cooking School  Pritikin's [employer] are designed to teach patients ways to prepare quick, simple, and affordable recipes at home. The importance of nutrition's role in  chronic disease risk  reduction is reflected in its emphasis in the overall Pritikin program. By learning how to prepare essential core Pritikin Eating Plan recipes, patients will increase control over what they eat; be able to customize the flavor of foods without the use of added salt, sugar, or fat; and improve the quality of the food they consume. By learning a set of core recipes which are easily assembled, quickly prepared, and affordable, patients are more likely to prepare more healthy foods at home. These workshops focus on convenient breakfasts, simple entres, side dishes, and desserts which can be prepared with minimal effort and are consistent with nutrition recommendations for cardiovascular risk reduction. Cooking [employer] are taught by a Armed forces logistics/support/administrative officer (RD) who has been trained by the [employer]. The chef or RD has a clear understanding of the importance of minimizing - if not completely eliminating - added fat, sugar, and sodium in recipes. Throughout the series of Cooking School Workshop sessions, patients will learn about healthy ingredients and efficient methods of cooking to build confidence in their capability to prepare    Cooking School weekly topics:  Adding Flavor- Sodium-Free  Fast and Healthy Breakfasts  Powerhouse Plant-Based Proteins  Satisfying Salads and Dressings  Simple Sides and Sauces  International Cuisine-Spotlight on the [employer] Zones  Delicious Desserts  Savory Soups  [employer] - Meals in a Astronomer Appetizers and Snacks  Comforting Weekend Breakfasts  One-Pot Wonders   Fast Evening Meals  Landscape architect Your Pritikin Plate  WORKSHOPS   Healthy Mindset (Psychosocial):  Focused Goals, Sustainable Changes Clinical staff led group instruction and group discussion with PowerPoint presentation and patient guidebook. To enhance the learning environment the use of posters, models and videos may  be added. Patients will be able to apply effective goal setting strategies to establish at least one personal goal, and then take consistent, meaningful action toward that goal. They will learn to identify common barriers to achieving personal goals and develop strategies to overcome them. Patients will also gain an understanding of how our mind-set can impact our ability to achieve goals and the importance of cultivating a positive and growth-oriented mind-set. The purpose of this lesson is to provide patients with a deeper understanding of how to set and achieve personal goals, as well as the tools and strategies needed to overcome common obstacles which may arise along the way.  From Head to Heart: The Power of a Healthy Outlook  Clinical staff led group instruction and group discussion with PowerPoint presentation and patient guidebook. To enhance the learning environment the use of posters, models and videos may be added. Patients will be able to recognize and describe the impact of emotions and mood on physical health. They will discover the importance of self-care and explore self-care practices which may work for them. Patients will also learn how to utilize the 4 C's to cultivate a healthier outlook and better manage stress and challenges. The purpose of this lesson is to demonstrate to patients how a healthy outlook is an essential part of maintaining good health, especially as they continue their cardiac rehab journey.  Healthy Sleep for a Healthy Heart Clinical staff led group instruction and group discussion with PowerPoint presentation and patient guidebook. To enhance the learning environment the use of posters, models and videos may be added. At the conclusion of this workshop, patients will be able to demonstrate knowledge of the importance of sleep to overall health, well-being, and quality of life.  They will understand the symptoms of, and treatments for, common sleep disorders. Patients  will also be able to identify daytime and nighttime behaviors which impact sleep, and they will be able to apply these tools to help manage sleep-related challenges. The purpose of this lesson is to provide patients with a general overview of sleep and outline the importance of quality sleep. Patients will learn about a few of the most common sleep disorders. Patients will also be introduced to the concept of "sleep hygiene," and discover ways to self-manage certain sleeping problems through simple daily behavior changes. Finally, the workshop will motivate patients by clarifying the links between quality sleep and their goals of heart-healthy living.   Recognizing and Reducing Stress Clinical staff led group instruction and group discussion with PowerPoint presentation and patient guidebook. To enhance the learning environment the use of posters, models and videos may be added. At the conclusion of this workshop, patients will be able to understand the types of stress reactions, differentiate between acute and chronic stress, and recognize the impact that chronic stress has on their health. They will also be able to apply different coping mechanisms, such as reframing negative self-talk. Patients will have the opportunity to practice a variety of stress management techniques, such as deep abdominal breathing, progressive muscle relaxation, and/or guided imagery.  The purpose of this lesson is to educate patients on the role of stress in their lives and to provide healthy techniques for coping with it.  Learning Barriers/Preferences:  Learning Barriers/Preferences - 02/17/23 1324       Learning Barriers/Preferences   Learning Barriers Sight   wears contacts   Learning Preferences Video;Computer/Internet;Group Instruction;Individual Instruction;Pictoral;Skilled Demonstration             Education Topics:  Knowledge Questionnaire Score:  Knowledge Questionnaire Score - 02/17/23 1325        Knowledge Questionnaire Score   Pre Score 22/24             Core Components/Risk Factors/Patient Goals at Admission:  Personal Goals and Risk Factors at Admission - 02/17/23 1326       Core Components/Risk Factors/Patient Goals on Admission    Weight Management Yes;Obesity;Weight Loss    Intervention Weight Management: Develop a combined nutrition and exercise program designed to reach desired caloric intake, while maintaining appropriate intake of nutrient and fiber, sodium and fats, and appropriate energy expenditure required for the weight goal.;Weight Management: Provide education and appropriate resources to help participant work on and attain dietary goals.;Weight Management/Obesity: Establish reasonable short term and long term weight goals.    Admit Weight 240 lb 15.4 oz (109.3 kg)    Goal Weight: Long Term 190 lb (86.2 kg)    Expected Outcomes Short Term: Continue to assess and modify interventions until short term weight is achieved;Long Term: Adherence to nutrition and physical activity/exercise program aimed toward attainment of established weight goal;Weight Maintenance: Understanding of the daily nutrition guidelines, which includes 25-35% calories from fat, 7% or less cal from saturated fats, less than 200mg  cholesterol, less than 1.5gm of sodium, & 5 or more servings of fruits and vegetables daily;Weight Loss: Understanding of general recommendations for a balanced deficit meal plan, which promotes 1-2 lb weight loss per week and includes a negative energy balance of [phone] kcal/d;Understanding recommendations for meals to include 15-35% energy as protein, 25-35% energy from fat, 35-60% energy from carbohydrates, less than 200mg  of dietary cholesterol, 20-35 gm of total fiber daily;Understanding of distribution of calorie intake throughout the day with  the consumption of 4-5 meals/snacks    Diabetes Yes    Intervention Provide education about signs/symptoms and action to take  for hypo/hyperglycemia.;Provide education about proper nutrition, including hydration, and aerobic/resistive exercise prescription along with prescribed medications to achieve blood glucose in normal ranges: Fasting glucose 65-99 mg/dL    Expected Outcomes Short Term: Participant verbalizes understanding of the signs/symptoms and immediate care of hyper/hypoglycemia, proper foot care and importance of medication, aerobic/resistive exercise and nutrition plan for blood glucose control.;Long Term: Attainment of HbA1C < 7%.    Hypertension Yes    Intervention Provide education on lifestyle modifcations including regular physical activity/exercise, weight management, moderate sodium restriction and increased consumption of fresh fruit, vegetables, and low fat dairy, alcohol moderation, and smoking cessation.;Monitor prescription use compliance.    Expected Outcomes Short Term: Continued assessment and intervention until BP is < 140/54mm HG in hypertensive participants. < 130/80mm HG in hypertensive participants with diabetes, heart failure or chronic kidney disease.;Long Term: Maintenance of blood pressure at goal levels.    Lipids Yes    Intervention Provide education and support for participant on nutrition & aerobic/resistive exercise along with prescribed medications to achieve LDL 70mg , HDL >40mg .    Expected Outcomes Short Term: Participant states understanding of desired cholesterol values and is compliant with medications prescribed. Participant is following exercise prescription and nutrition guidelines.;Long Term: Cholesterol controlled with medications as prescribed, with individualized exercise RX and with personalized nutrition plan. Value goals: LDL < 70mg , HDL > 40 mg.             Core Components/Risk Factors/Patient Goals Review:   Goals and Risk Factor Review     Row Name 03/01/23 0748 03/28/23 0841           Core Components/Risk Factors/Patient Goals Review   Personal Goals  Review Weight Management/Obesity;Hypertension;Lipids Weight Management/Obesity;Hypertension;Lipids      Review Abubakr started intensive cardiac rehab on 02/21/23 and is off to a good start to exercise. Vital signs and CBG's have been stable Tyquavious continues to do well with exercise at  intensive cardiac rehab. Vital signs and CBG's have been stable      Expected Outcomes Emmons will continue to participate in intensive cardiac rehab for exercise, nutrition and lifestyle modifictions Taurus will continue to participate in intensive cardiac rehab for exercise, nutrition and lifestyle modifictions               Core Components/Risk Factors/Patient Goals at Discharge (Final Review):   Goals and Risk Factor Review - 03/28/23 0841       Core Components/Risk Factors/Patient Goals Review   Personal Goals Review Weight Management/Obesity;Hypertension;Lipids    Review Aikam continues to do well with exercise at  intensive cardiac rehab. Vital signs and CBG's have been stable    Expected Outcomes Eren will continue to participate in intensive cardiac rehab for exercise, nutrition and lifestyle modifictions             ITP Comments:  ITP Comments     Row Name 02/17/23 0836 02/28/23 1734 03/28/23 0840       ITP Comments Dr. Armanda Magic medical director. Introduction to pritikin education program/ intensive cardiac rehab. Intitial orientation packet reviewed with patient 30 Day ITP Review. Jothnathan started intensive cardiac rehab on 02/21/23 and is off to a good start to exercise. 30 Day ITP Review. Jothnathan has good attendance and participation in  intensive cardiac rehab              Comments: See  ITP comments.Harrell Gave RN BSN

## 2023-03-30 ENCOUNTER — Ambulatory Visit: Payer: BC Managed Care – PPO

## 2023-03-30 ENCOUNTER — Encounter (HOSPITAL_COMMUNITY): Payer: BC Managed Care – PPO

## 2023-04-01 ENCOUNTER — Encounter (HOSPITAL_COMMUNITY): Payer: BC Managed Care – PPO

## 2023-04-04 ENCOUNTER — Encounter (HOSPITAL_COMMUNITY): Payer: BC Managed Care – PPO

## 2023-04-06 ENCOUNTER — Encounter (HOSPITAL_COMMUNITY): Payer: BC Managed Care – PPO

## 2023-04-08 ENCOUNTER — Ambulatory Visit: Payer: BC Managed Care – PPO | Attending: Internal Medicine | Admitting: *Deleted

## 2023-04-08 ENCOUNTER — Encounter (HOSPITAL_COMMUNITY)
Admission: RE | Admit: 2023-04-08 | Discharge: 2023-04-08 | Disposition: A | Discharge status: Home / Self Care | Payer: BC Managed Care – PPO | Source: Ambulatory Visit | Attending: Internal Medicine | Admitting: Internal Medicine

## 2023-04-08 DIAGNOSIS — Z5181 Encounter for therapeutic drug level monitoring: Secondary | ICD-10-CM

## 2023-04-08 DIAGNOSIS — Z952 Presence of prosthetic heart valve: Secondary | ICD-10-CM

## 2023-04-08 DIAGNOSIS — Z95828 Presence of other vascular implants and grafts: Secondary | ICD-10-CM

## 2023-04-08 DIAGNOSIS — I3139 Other pericardial effusion (noninflammatory): Secondary | ICD-10-CM | POA: Diagnosis not present

## 2023-04-08 LAB — POCT INR: POC INR: 1.5

## 2023-04-08 NOTE — Patient Instructions (Addendum)
Description   Take 1.5 tablets of warfarin today and then START taking warfarin 1 tablet daily except for 1.5 tablets on Sunday, Tuesday and Thursday. Recheck INR in 1 week.  Recheck INR in 2 weeks.   [facility] Clinic [phone]

## 2023-04-11 ENCOUNTER — Encounter (HOSPITAL_COMMUNITY): Payer: BC Managed Care – PPO

## 2023-04-11 NOTE — Progress Notes (Signed)
Nurse visit

## 2023-04-13 ENCOUNTER — Encounter (HOSPITAL_COMMUNITY)
Admission: RE | Admit: 2023-04-13 | Discharge: 2023-04-13 | Disposition: A | Discharge status: Home / Self Care | Payer: BC Managed Care – PPO | Source: Ambulatory Visit | Attending: Internal Medicine | Admitting: Internal Medicine

## 2023-04-13 DIAGNOSIS — Z95828 Presence of other vascular implants and grafts: Secondary | ICD-10-CM

## 2023-04-13 DIAGNOSIS — Z952 Presence of prosthetic heart valve: Secondary | ICD-10-CM | POA: Insufficient documentation

## 2023-04-15 ENCOUNTER — Ambulatory Visit: Payer: BC Managed Care – PPO | Attending: Cardiovascular Disease

## 2023-04-15 ENCOUNTER — Encounter (HOSPITAL_COMMUNITY)
Admission: RE | Admit: 2023-04-15 | Discharge: 2023-04-15 | Disposition: A | Discharge status: Home / Self Care | Payer: BC Managed Care – PPO | Source: Ambulatory Visit | Attending: Internal Medicine | Admitting: Internal Medicine

## 2023-04-15 DIAGNOSIS — Z95828 Presence of other vascular implants and grafts: Secondary | ICD-10-CM

## 2023-04-15 DIAGNOSIS — I3139 Other pericardial effusion (noninflammatory): Secondary | ICD-10-CM | POA: Diagnosis not present

## 2023-04-15 DIAGNOSIS — Z7901 Long term (current) use of anticoagulants: Secondary | ICD-10-CM | POA: Diagnosis not present

## 2023-04-15 DIAGNOSIS — Z952 Presence of prosthetic heart valve: Secondary | ICD-10-CM

## 2023-04-15 LAB — POCT INR: INR: 2.3 (ref 2.0–3.0)

## 2023-04-15 NOTE — Patient Instructions (Signed)
Description   Continue taking warfarin 1 tablet daily except for 1.5 tablets on Sunday, Tuesday and Thursday.  Stay consistent with greens each week.  Recheck INR in 2 weeks.   Anticoagulation Clinic [phone]

## 2023-04-18 ENCOUNTER — Encounter (HOSPITAL_COMMUNITY): Payer: BC Managed Care – PPO

## 2023-04-20 ENCOUNTER — Encounter (HOSPITAL_COMMUNITY)
Admission: RE | Admit: 2023-04-20 | Discharge: 2023-04-20 | Disposition: A | Discharge status: Home / Self Care | Payer: BC Managed Care – PPO | Source: Ambulatory Visit | Attending: Internal Medicine | Admitting: Internal Medicine

## 2023-04-20 DIAGNOSIS — Z95828 Presence of other vascular implants and grafts: Secondary | ICD-10-CM

## 2023-04-20 DIAGNOSIS — Z952 Presence of prosthetic heart valve: Secondary | ICD-10-CM

## 2023-04-20 NOTE — Progress Notes (Signed)
Cardiac Individual Treatment Plan  Patient Details  Name: Douglas Phillips MRN: [id number] Date of Birth: July 05, 1976 Referring Provider:   Flowsheet Row INTENSIVE CARDIAC REHAB ORIENT from 02/17/2023 in [facility]  Referring Provider Carolan Clines, MD       Initial Encounter Date:  Flowsheet Row INTENSIVE CARDIAC REHAB ORIENT from 02/17/2023 in [facility]  Date 02/17/23       Visit Diagnosis: 12/27/22 AVR (aortic valve replacement)  12/27/22 ascending aorta replacement  Patient's Home Medications on Admission:  Current Outpatient Medications:    acetaminophen (TYLENOL) 500 MG tablet, Take 2 tablets (1,000 mg total) by mouth every 6 (six) hours as needed for mild pain or fever., Disp: 30 tablet, Rfl: 0   atorvastatin (LIPITOR) 40 MG tablet, Take 1 tablet (40 mg total) by mouth daily. (Patient taking differently: Take 40 mg by mouth at bedtime.), Disp: 90 tablet, Rfl: 3   blood glucose meter kit and supplies, Dispense based on patient and insurance preference. Use one time daily. ICD10: E11.65, Disp: 1 each, Rfl: 0   fenofibrate 160 MG tablet, Take 160 mg by mouth daily., Disp: , Rfl:    glipiZIDE (GLUCOTROL) 5 MG tablet, Take 5 mg by mouth at bedtime., Disp: , Rfl:    glucose blood (ONETOUCH VERIO) test strip, 1 each by Other route in the morning and at bedtime. Use as instructed, Disp: 100 each, Rfl: 1   lamoTRIgine (LAMICTAL) 100 MG tablet, Take 1 tablet (100 mg total) by mouth 2 (two) times daily., Disp: 60 tablet, Rfl: 5   lamoTRIgine (LAMICTAL) 25 MG tablet, Take 1 pill at bedtime for 2 weeks. Then take 1 pill twice a day for 2 weeks. Then take 1 pill in AM and 2 pills in PM for one week. Then take 2 pills twice a day for one week. Then take 2 pills in AM and 3 pills in PM for one week. Then take 3 pills twice a day for one week. Then take 3 pills in AM and 4 pills in  PM for one week. Then take 4 pills twice a day until pills are gone. Then start 100 mg tablets., Disp: 273 tablet, Rfl: 0   metoprolol tartrate (LOPRESSOR) 25 MG tablet, Take 0.5 tablets (12.5 mg total) by mouth 2 (two) times daily., Disp: 30 tablet, Rfl: 3   NOVOLIN 70/30 KWIKPEN (70-30) 100 UNIT/ML KwikPen, Inject 18 Units into the skin in the morning and at bedtime., Disp: , Rfl:    oxyCODONE (OXY IR/ROXICODONE) 5 MG immediate release tablet, Take 1 tablet (5 mg total) by mouth every 4 (four) hours as needed for severe pain., Disp: 30 tablet, Rfl: 0   pioglitazone (ACTOS) 15 MG tablet, Take 15 mg by mouth in the morning., Disp: , Rfl:    promethazine (PHENERGAN) 12.5 MG tablet, Take 1 tablet (12.5 mg total) by mouth every 6 (six) hours as needed for nausea or vomiting., Disp: 15 tablet, Rfl: 0   VITAMIN D PO, Take 2,000 Units by mouth daily., Disp: , Rfl:    warfarin (COUMADIN) 2.5 MG tablet, Take 1 tablet (2.5 mg total) by mouth daily., Disp: 30 tablet, Rfl: 11   ZENPEP 40000-126000 units CPEP, Take 2 capsules by mouth with breakfast, with lunch, and with evening meal., Disp: , Rfl:   Past Medical History: Past Medical History:  Diagnosis Date   Diabetes mellitus without complication (HCC)  type 2   High cholesterol    Hypertension    Kidney stones    Migraine    MIGRAINE HEADACHE 04/05/2007   Qualifier: Diagnosis of  By: Drue Novel MD, Jose E.    Non-restorative sleep 03/12/2019   Sleep apnea    wears CPAP    Tobacco Use: Social History   Tobacco Use  Smoking Status Never  Smokeless Tobacco Never    Labs: Review Flowsheet  More data exists      Latest Ref Rng & Units 10/12/2021 12/17/2022 12/27/2022 01/04/2023 01/18/2023  Labs for ITP Cardiac and Pulmonary Rehab  Cholestrol 100 - 199 mg/dL - - - - 98   LDL (calc) 0 - 99 mg/dL - - - - 55   HDL-C >16 mg/dL - - - - 26   Trlycerides 0 - 149 mg/dL - - - - 81   Hemoglobin A1c 4.8 - 5.6 % 5.9  - - 5.8  -  PH, Arterial 7.35 - 7.45 -  7.364  7.334  7.364  7.313  7.410  7.286  7.228  7.403  7.331  - -  PCO2 arterial 32 - 48 mmHg - 40.5  42.2  37.7  44.0  34.9  50.3  58.2  38.5  43.1  - -  Bicarbonate 20.0 - 28.0 mmol/L - 24.1  24.2  23.1  22.5  21.5  22.8  22.1  23.9  24.3  24.8  24.0  22.8  - -  TCO2 22 - 32 mmol/L - 25  26  24  24  23  24  23  23  24  27  25  26  26  26  25  26  24  24   - -  Acid-base deficit 0.0 - 2.0 mmol/L - 2.0  2.0  2.0  3.0  4.0  4.0  2.0  3.0  3.0  1.0  1.0  3.0  - -  O2 Saturation % - 74  82  95  99  100  97  100  100  100  88  100  100  - -    Capillary Blood Glucose: Lab Results  Component Value Date   GLUCAP 99 02/23/2023   GLUCAP 129 (H) 02/23/2023   GLUCAP 105 (H) 02/21/2023   GLUCAP 118 (H) 02/21/2023   GLUCAP 219 (H) 01/09/2023     Exercise Target Goals: Exercise Program Goal: Individual exercise prescription set using results from initial 6 min walk test and THRR while considering  patient's activity barriers and safety.   Exercise Prescription Goal: Initial exercise prescription builds to 30-45 minutes a day of aerobic activity, 2-3 days per week.  Home exercise guidelines will be given to patient during program as part of exercise prescription that the participant will acknowledge.  Activity Barriers & Risk Stratification:  Activity Barriers & Cardiac Risk Stratification - 02/17/23 1309       Activity Barriers & Cardiac Risk Stratification   Activity Barriers Back Problems;Deconditioning;Muscular Weakness;Other (comment)    Comments Sternal Precautions    Cardiac Risk Stratification High             6 Minute Walk:  6 Minute Walk     Row Name 02/17/23 0908         6 Minute Walk   Phase Initial     Distance 1591 feet     Walk Time 6 minutes     # of Rest Breaks 0     MPH 3  METS 4.2     RPE 11     Perceived Dyspnea  0     VO2 Peak 14.7     Symptoms No     Resting HR 64 bpm     Resting BP 118/76     Resting Oxygen Saturation  99 %     Exercise Oxygen  Saturation  during 6 min walk 100 %     Max Ex. HR 77 bpm     Max Ex. BP 154/83     2 Minute Post BP 143/86              Oxygen Initial Assessment:   Oxygen Re-Evaluation:   Oxygen Discharge (Final Oxygen Re-Evaluation):   Initial Exercise Prescription:  Initial Exercise Prescription - 02/17/23 1300       Date of Initial Exercise RX and Referring Provider   Date 02/17/23    Referring Provider Carolan Clines, MD    Expected Discharge Date 04/29/23      Treadmill   MPH 2.7    Grade 1    Minutes 15    METs 3.44      Recumbant Bike   Level 2    RPM 60    Watts 50    Minutes 15    METs 3.43      Prescription Details   Frequency (times per week) 3    Duration Progress to 30 minutes of continuous aerobic without signs/symptoms of physical distress      Intensity   THRR 40-80% of Max Heartrate 70-140    Ratings of Perceived Exertion 11-13    Perceived Dyspnea 0-4      Progression   Progression Continue progressive overload as per policy without signs/symptoms or physical distress.      Resistance Training   Training Prescription Yes    Weight 4 lbs    Reps 10-15             Perform Capillary Blood Glucose checks as needed.  Exercise Prescription Changes:   Exercise Prescription Changes     Row Name 02/21/23 0829 03/16/23 0838 03/28/23 0800 04/08/23 0800 04/20/23 0830     Response to Exercise   Blood Pressure (Admit) 130/64 122/64 106/58 116/60 118/60   Blood Pressure (Exercise) 140/70 130/66 128/62 120/62 138/66   Blood Pressure (Exit) 134/66 112/60 118/56 110/60 110/60   Heart Rate (Admit) 84 bpm 62 bpm 64 bpm 62 bpm 68 bpm   Heart Rate (Exercise) 90 bpm 90 bpm 89 bpm 87 bpm 94 bpm   Heart Rate (Exit) 66 bpm 68 bpm 59 bpm 68 bpm 67 bpm   Rating of Perceived Exertion (Exercise) 12.5 13.5 12 12.5 12   Perceived Dyspnea (Exercise) 0 0 0 0 0   Symptoms 0 0 0 0 0   Comments Pr first day in the [facility] CRP2 program Reviewed MET's, goals and home  ExRx Reviewed METs Reviewed goals Reviewed mets   Duration Progress to 30 minutes of  aerobic without signs/symptoms of physical distress Progress to 30 minutes of  aerobic without signs/symptoms of physical distress Progress to 30 minutes of  aerobic without signs/symptoms of physical distress Continue with 30 min of aerobic exercise without signs/symptoms of physical distress. Continue with 30 min of aerobic exercise without signs/symptoms of physical distress.   Intensity THRR unchanged THRR unchanged THRR unchanged THRR unchanged THRR unchanged     Progression   Progression Continue to progress workloads to maintain intensity without signs/symptoms of physical  distress. Continue to progress workloads to maintain intensity without signs/symptoms of physical distress. Continue to progress workloads to maintain intensity without signs/symptoms of physical distress. Continue to progress workloads to maintain intensity without signs/symptoms of physical distress. Continue to progress workloads to maintain intensity without signs/symptoms of physical distress.   Average METs 3.47 3.96 4.06 4.06 4.51     Resistance Training   Training Prescription Yes No Yes Yes No   Weight 4 lbs -- 4 4 --   Reps 10-15 -- 10-15 10-15 --   Time 10 Minutes -- 10 Minutes 10 Minutes --     Treadmill   MPH 2.7 2.9 2.9 2.9 2.9   Grade 1 1 1 1  1.5   Minutes 15 15 15 15 15    METs 3.44 3.62 3.62 3.62 3.82     Recumbant Bike   Level 2 3 3.3 3.3 3.3   RPM 77 80 -- -- 81   Watts 35 53 -- -- 69   Minutes 15 15 15 15 15    METs 3.5 4.3 4.5 4.5 5.2     Home Exercise Plan   Plans to continue exercise at -- Home (comment) -- -- Home (comment)   Frequency -- Add 2 additional days to program exercise sessions. -- -- Add 2 additional days to program exercise sessions.   Initial Home Exercises Provided -- 03/16/23 -- -- 03/16/23            Exercise Comments:   Exercise Comments     Row Name 02/21/23 0840 03/16/23  0845 03/28/23 0837 04/08/23 0839 04/20/23 0830   Exercise Comments Pt first day in the [facility] program. Pt tolerated exercise well with an average MET level of 3.47. Pt is learning his THRR, RPE and ExRx. Off to a good start Reviewed MET's, goals and home ExRx. Pt tolerated exercise well with an average MET level of 3.96. Pt will adding in walking for exercise 1-2 days for 30-45 mins per session. Pt had been feeling better overall since surgery and is increase his stamina over time. Reviewed METs with pt today. Pt is tolerating an avg MET level of 4.06 well. Pt feels good about his progress. He is happy with his speed on the treadmill and is interested in beginning to add an incline to increase his workload. Will continue to monitor and progress workloads as tolerated without s/sx. Reviewed goals with pt today. Discussed ways to continue to work towards goals of weight loss and increased strength/stamina while having a busy lifestyle. Pt is feeling good about his progress in the program and will continue to work on his goals with the knowledge he has been given here so far. Reviewed MET's with pt today. Pt tolerated exercise well with an average MET level of 4.51. Pt is increasing MET's and feels good with his exercise routine            Exercise Goals and Review:   Exercise Goals     Row Name 02/17/23 1310             Exercise Goals   Increase Physical Activity Yes       Intervention Provide advice, education, support and counseling about physical activity/exercise needs.;Develop an individualized exercise prescription for aerobic and resistive training based on initial evaluation findings, risk stratification, comorbidities and participant's personal goals.       Expected Outcomes Short Term: Attend rehab on a regular basis to increase amount of physical activity.;Long Term: Add in home exercise to  make exercise part of routine and to increase amount of physical activity.;Long Term:  Exercising regularly at least 3-5 days a week.       Increase Strength and Stamina Yes       Intervention Provide advice, education, support and counseling about physical activity/exercise needs.;Develop an individualized exercise prescription for aerobic and resistive training based on initial evaluation findings, risk stratification, comorbidities and participant's personal goals.       Expected Outcomes Short Term: Increase workloads from initial exercise prescription for resistance, speed, and METs.;Short Term: Perform resistance training exercises routinely during rehab and add in resistance training at home;Long Term: Improve cardiorespiratory fitness, muscular endurance and strength as measured by increased METs and functional capacity ( )       Able to understand and use rate of perceived exertion (RPE) scale Yes       Intervention Provide education and explanation on how to use RPE scale       Expected Outcomes Short Term: Able to use RPE daily in rehab to express subjective intensity level;Long Term:  Able to use RPE to guide intensity level when exercising independently       Knowledge and understanding of Target Heart Rate Range (THRR) Yes       Intervention Provide education and explanation of THRR including how the numbers were predicted and where they are located for reference       Expected Outcomes Short Term: Able to state/look up THRR;Short Term: Able to use daily as guideline for intensity in rehab;Long Term: Able to use THRR to govern intensity when exercising independently       Understanding of Exercise Prescription Yes       Intervention Provide education, explanation, and written materials on patient's individual exercise prescription       Expected Outcomes Short Term: Able to explain program exercise prescription;Long Term: Able to explain home exercise prescription to exercise independently                Exercise Goals Re-Evaluation :  Exercise Goals Re-Evaluation      Row Name 02/21/23 0836 03/16/23 0841 04/08/23 0835         Exercise Goal Re-Evaluation   Exercise Goals Review Increase Physical Activity;Understanding of Exercise Prescription;Increase Strength and Stamina;Knowledge and understanding of Target Heart Rate Range (THRR);Able to understand and use rate of perceived exertion (RPE) scale Increase Physical Activity;Understanding of Exercise Prescription;Increase Strength and Stamina;Knowledge and understanding of Target Heart Rate Range (THRR);Able to understand and use rate of perceived exertion (RPE) scale Increase Physical Activity;Understanding of Exercise Prescription;Increase Strength and Stamina;Knowledge and understanding of Target Heart Rate Range (THRR);Able to understand and use rate of perceived exertion (RPE) scale     Comments Pt first day in the [facility] program. Pt tolerated exercise well with an average MET level of 3.47. Pt is learning his THRR, RPE and ExRx. Off to a good start Reviewed MET's, goals and home ExRx. Pt tolerated exercise well with an average MET level of 3.96. Pt will adding in walking for exercise 1-2 days for 30-45 mins per session. Pt had been feeling better overall since surgery and is increase his stamina over time. Reviewed goals with pt today. Pt original goals were to lose weight and build strength/stamina. Pt has been travelling lately with his son and for work which has caused him to fall out of his routine and gain a few pounds back. He feels he has the resources and knowledge to know what to do, but  finding ways to do it with travel is difficult. We discussed how to fit exercise into his changing routine and travel. He feels he is stronger when he is exercising here in the program.     Expected Outcomes Will continue to monitor pt and progress workloads as tolerated without sign or symptom Will continue to monitor pt and progress workloads as tolerated without sign or symptom Will continue to monitor and  progress workloads as tolerated without s/sx.              Discharge Exercise Prescription (Final Exercise Prescription Changes):  Exercise Prescription Changes - 04/20/23 0830       Response to Exercise   Blood Pressure (Admit) 118/60    Blood Pressure (Exercise) 138/66    Blood Pressure (Exit) 110/60    Heart Rate (Admit) 68 bpm    Heart Rate (Exercise) 94 bpm    Heart Rate (Exit) 67 bpm    Rating of Perceived Exertion (Exercise) 12    Perceived Dyspnea (Exercise) 0    Symptoms 0    Comments Reviewed mets    Duration Continue with 30 min of aerobic exercise without signs/symptoms of physical distress.    Intensity THRR unchanged      Progression   Progression Continue to progress workloads to maintain intensity without signs/symptoms of physical distress.    Average METs 4.51      Resistance Training   Training Prescription No      Treadmill   MPH 2.9    Grade 1.5    Minutes 15    METs 3.82      Recumbant Bike   Level 3.3    RPM 81    Watts 69    Minutes 15    METs 5.2      Home Exercise Plan   Plans to continue exercise at Home (comment)    Frequency Add 2 additional days to program exercise sessions.    Initial Home Exercises Provided 03/16/23             Nutrition:  Target Goals: Understanding of nutrition guidelines, daily intake of sodium 1500mg , cholesterol 200mg , calories 30% from fat and 7% or less from saturated fats, daily to have 5 or more servings of fruits and vegetables.  Biometrics:  Pre Biometrics - 02/17/23 0825       Pre Biometrics   Waist Circumference 48.5 inches    Hip Circumference 45 inches    Waist to Hip Ratio 1.08 %    Triceps Skinfold 25 mm    % Body Fat 35.9 %    Grip Strength 38 kg    Flexibility 11.25 in    Single Leg Stand 30 seconds              Nutrition Therapy Plan and Nutrition Goals:  Nutrition Therapy & Goals - 04/15/23 0958       Nutrition Therapy   Diet Heart Healthy/Carbohydrate  Consistent Diet    Drug/Food Interactions Statins/Certain Fruits;Coumadin/Vit K      Personal Nutrition Goals   Nutrition Goal Patient to identify strategies for reducing cardiovascular risk by attending the [facility] education and nutrition series weekly.    Personal Goal #2 Patient to improve diet quality by using the plate method as a guide for meal planning to include lean protein/plant protein, fruits, vegetables, whole grains, nonfat dairy as part of a well-balanced diet.    Personal Goal #3 Patient to identify food sources and limit daily intake of saturated  fat, trans fat, sodium, and refined carbohydrates.    Comments Goals in action. Tiyon continues to attend the [employer] and nutrition series regularly. He has started making many changes including introduced many Pritikin recipes, reduced sodium intake, increased high fiber foods. He does continue to eat out often related to his son's travel baseball schedule; gave resources and disussed healthier choices when out to eat. Jahron has medical history of elevated triglycerides, WNL at this time and continues fenofibrate. A1c improved to 5.9. He is up 1.5# since starting with our program. Patient will continue to benefit from participation in intensive cardiac rehab for nutrition, exercise, and lifestyle modfication.      Intervention Plan   Intervention Prescribe, educate and counsel regarding individualized specific dietary modifications aiming towards targeted core components such as weight, hypertension, lipid management, diabetes, heart failure and other comorbidities.;Nutrition handout(s) given to patient.    Expected Outcomes Short Term Goal: Understand basic principles of dietary content, such as calories, fat, sodium, cholesterol and nutrients.;Long Term Goal: Adherence to prescribed nutrition plan.             Nutrition Assessments:  MEDIFICTS Score Key: ?70 Need to make dietary changes  40-70 Heart Healthy  Diet ? 40 Therapeutic Level Cholesterol Diet    Picture Your Plate Scores: <14 Unhealthy dietary pattern with much room for improvement. 41-50 Dietary pattern unlikely to meet recommendations for good health and room for improvement. 51-60 More healthful dietary pattern, with some room for improvement.  >60 Healthy dietary pattern, although there may be some specific behaviors that could be improved.    Nutrition Goals Re-Evaluation:  Nutrition Goals Re-Evaluation     Row Name 02/21/23 [phone] 03/21/23 0950 04/15/23 0958         Goals   Current Weight 238 lb 12.1 oz (108.3 kg) 242 lb 8.1 oz (110 kg) 242 lb 8.1 oz (110 kg)     Comment Lipids improved-HDL 26, lipoproteinA WNL, F6O 5.8- has been as high at 14 in the last 4 years. No new labs at this time; most recent labs  Lipids improved-HDL 26, lipoproteinA WNL, Z3Y 5.8- has been as high at 14 in the last 4 years. No new labs at this time; most recent labs Lipids improved-HDL 26, lipoproteinA WNL, Q6V 5.8- has been as high at 14 in the last 4 years.     Expected Outcome Ruven has medical history of elevated triglycerides, WNL at this time and continues fenofibrate. A1c improved to 5.9. Patient will continue to benefit from participation in intensive cardiac rehab for nutrition, exercise, and lifestyle modfication. Goals in action. Nickoles continues to attend the [employer] and nutrition series regularly. He has started making many changes including introduced many Pritikin recipes, reduced sodium intake, increased high fiber foods. He does continue to eat out often related to his son's travel baseball schedule; gave resources and disussed healthier choices when out to eat. Cozy has medical history of elevated triglycerides, WNL at this time and continues fenofibrate. A1c improved to 5.9. Patient will continue to benefit from participation in intensive cardiac rehab for nutrition, exercise, and lifestyle modfication. Goals in action.  Zayir continues to attend the [employer] and nutrition series regularly. He has started making many changes including introduced many Pritikin recipes, reduced sodium intake, increased high fiber foods. He does continue to eat out often related to his son's travel baseball schedule; gave resources and disussed healthier choices when out to eat. Berle has medical history of elevated triglycerides, WNL  at this time and continues fenofibrate. A1c improved to 5.9. He is up 1.5# since starting with our program. Patient will continue to benefit from participation in intensive cardiac rehab for nutrition, exercise, and lifestyle modfication.              Nutrition Goals Re-Evaluation:  Nutrition Goals Re-Evaluation     Row Name 02/21/23 [phone] 03/21/23 0950 04/15/23 0958         Goals   Current Weight 238 lb 12.1 oz (108.3 kg) 242 lb 8.1 oz (110 kg) 242 lb 8.1 oz (110 kg)     Comment Lipids improved-HDL 26, lipoproteinA WNL, J1B 5.8- has been as high at 14 in the last 4 years. No new labs at this time; most recent labs  Lipids improved-HDL 26, lipoproteinA WNL, J4N 5.8- has been as high at 14 in the last 4 years. No new labs at this time; most recent labs Lipids improved-HDL 26, lipoproteinA WNL, W2N 5.8- has been as high at 14 in the last 4 years.     Expected Outcome Ferlando has medical history of elevated triglycerides, WNL at this time and continues fenofibrate. A1c improved to 5.9. Patient will continue to benefit from participation in intensive cardiac rehab for nutrition, exercise, and lifestyle modfication. Goals in action. Thedford continues to attend the [employer] and nutrition series regularly. He has started making many changes including introduced many Pritikin recipes, reduced sodium intake, increased high fiber foods. He does continue to eat out often related to his son's travel baseball schedule; gave resources and disussed healthier choices when out to eat. Zaniel  has medical history of elevated triglycerides, WNL at this time and continues fenofibrate. A1c improved to 5.9. Patient will continue to benefit from participation in intensive cardiac rehab for nutrition, exercise, and lifestyle modfication. Goals in action. Rehaan continues to attend the [employer] and nutrition series regularly. He has started making many changes including introduced many Pritikin recipes, reduced sodium intake, increased high fiber foods. He does continue to eat out often related to his son's travel baseball schedule; gave resources and disussed healthier choices when out to eat. Garney has medical history of elevated triglycerides, WNL at this time and continues fenofibrate. A1c improved to 5.9. He is up 1.5# since starting with our program. Patient will continue to benefit from participation in intensive cardiac rehab for nutrition, exercise, and lifestyle modfication.              Nutrition Goals Discharge (Final Nutrition Goals Re-Evaluation):  Nutrition Goals Re-Evaluation - 04/15/23 0958       Goals   Current Weight 242 lb 8.1 oz (110 kg)    Comment No new labs at this time; most recent labs Lipids improved-HDL 26, lipoproteinA WNL, F6O 5.8- has been as high at 14 in the last 4 years.    Expected Outcome Goals in action. Treg continues to attend the [employer] and nutrition series regularly. He has started making many changes including introduced many Pritikin recipes, reduced sodium intake, increased high fiber foods. He does continue to eat out often related to his son's travel baseball schedule; gave resources and disussed healthier choices when out to eat. Kobe has medical history of elevated triglycerides, WNL at this time and continues fenofibrate. A1c improved to 5.9. He is up 1.5# since starting with our program. Patient will continue to benefit from participation in intensive cardiac rehab for nutrition, exercise, and lifestyle  modfication.  Psychosocial: Target Goals: Acknowledge presence or absence of significant depression and/or stress, maximize coping skills, provide positive support system. Participant is able to verbalize types and ability to use techniques and skills needed for reducing stress and depression.  Initial Review & Psychosocial Screening:  Initial Psych Review & Screening - 02/17/23 1321       Initial Review   Current issues with None Identified      Family Dynamics   Good Support System? Yes    Comments Pt has his spouse for support      Barriers   Psychosocial barriers to participate in program There are no identifiable barriers or psychosocial needs.      Screening Interventions   Interventions Encouraged to exercise             Quality of Life Scores:  Quality of Life - 02/17/23 1324       Quality of Life   Select Quality of Life      Quality of Life Scores   Health/Function Pre 20.73 %    Socioeconomic Pre 23.93 %    Psych/Spiritual Pre 18.64 %    Family Pre 24 %    GLOBAL Pre 21.44 %            Scores of 19 and below usually indicate a poorer quality of life in these areas.  A difference of  2-3 points is a clinically meaningful difference.  A difference of 2-3 points in the total score of the Quality of Life Index has been associated with significant improvement in overall quality of life, self-image, physical symptoms, and general health in studies assessing change in quality of life.  PHQ-9: Review Flowsheet       02/17/2023 10/07/2021 02/01/2019  Depression screen PHQ 2/9  Decreased Interest 0 0 0  Down, Depressed, Hopeless 0 1 0  PHQ - 2 Score 0 1 0  Altered sleeping 0 0 -  Tired, decreased energy 1 0 -  Change in appetite 0 0 -  Feeling bad or failure about yourself  0 1 -  Trouble concentrating 0 0 -  Moving slowly or fidgety/restless 0 0 -  Suicidal thoughts 0 0 -  PHQ-9 Score 1 2 -  Difficult doing work/chores Not difficult at  all Not difficult at all -   Interpretation of Total Score  Total Score Depression Severity:  1-4 = Minimal depression, 5-9 = Mild depression, 10-14 = Moderate depression, 15-19 = Moderately severe depression, 20-27 = Severe depression   Psychosocial Evaluation and Intervention:   Psychosocial Re-Evaluation:  Psychosocial Re-Evaluation     Row Name 02/28/23 1735 03/28/23 0840 04/20/23 1632         Psychosocial Re-Evaluation   Current issues with None Identified None Identified None Identified     Interventions Encouraged to attend Cardiac Rehabilitation for the exercise Encouraged to attend Cardiac Rehabilitation for the exercise Encouraged to attend Cardiac Rehabilitation for the exercise     Continue Psychosocial Services  No Follow up required No Follow up required No Follow up required              Psychosocial Discharge (Final Psychosocial Re-Evaluation):  Psychosocial Re-Evaluation - 04/20/23 1632       Psychosocial Re-Evaluation   Current issues with None Identified    Interventions Encouraged to attend Cardiac Rehabilitation for the exercise    Continue Psychosocial Services  No Follow up required             Vocational Rehabilitation:  Provide vocational rehab assistance to qualifying candidates.   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 Pt has no vocational needs at this time.             Education: Education Goals: Education classes will be provided on a weekly basis, covering required topics. Participant will state understanding/return demonstration of topics presented.    Education     Row Name 02/21/23 1200     Education   Cardiac Education Topics Pritikin   Select Workshops     Workshops   Educator  Exercise Physiologist   Select Exercise   Exercise Workshop Location manager and Fall Prevention   Instruction Review Code 1- Verbalizes Understanding   Class Start Time 0815   Class Stop Time 0855   Class Time Calculation (min) 40 min    Row Name 02/23/23 1000     Education   Cardiac Education Topics Pritikin   Orthoptist   Educator Dietitian   Weekly Topic Fast and Healthy Breakfasts   Instruction Review Code 1- Verbalizes Understanding   Class Start Time 0813   Class Stop Time 0856   Class Time Calculation (min) 43 min    Row Name 02/25/23 0900     Education   Cardiac Education Topics Pritikin   Select Core Videos     Core Videos   Educator Dietitian   Select Nutrition   Nutrition Overview of the Pritikin Eating Plan   Instruction Review Code 1- Verbalizes Understanding   Class Start Time 0815   Class Stop Time 0905   Class Time Calculation (min) 50 min    Row Name 03/02/23 1000     Education   Cardiac Education Topics Pritikin   Secondary school teacher School   Educator Dietitian   Weekly Topic Personalizing Your Pritikin Plate   Instruction Review Code 1- Verbalizes Understanding   Class Start Time [phone]   Class Stop Time 0848   Class Time Calculation (min) 38 min    Row Name 03/14/23 0900     Education   Cardiac Education Topics Pritikin   Select Workshops     Workshops   Educator Exercise Physiologist   Select Exercise   Exercise Workshop Exercise Basics: Building Your Action Plan   Instruction Review Code 1- Verbalizes Understanding   Class Start Time 0815   Class Stop Time 0903   Class Time Calculation (min) 48 min    Row Name 03/16/23 0900     Education   Cardiac Education Topics Pritikin   Secondary school teacher School   Educator Dietitian   Weekly Topic Tasty Appetizers and Snacks   Instruction Review Code 1- Verbalizes Understanding   Class Start Time 0813   Class Stop Time 0845    Class Time Calculation (min) 32 min    Row Name 03/18/23 0900     Education   Cardiac Education Topics Pritikin   Select Core Videos     Core Videos   Educator Dietitian   Select Nutrition   Nutrition Calorie Density   Instruction Review Code 1- Verbalizes Understanding   Class Start Time 0815   Class Stop Time [phone]  Class Time Calculation (min) 41 min    Row Name 03/21/23 0900     Education   Cardiac Education Topics Pritikin   Select Core Videos     Core Videos   Educator Dietitian   Select Nutrition   Nutrition Nutrition Action Plan   Instruction Review Code 1- Verbalizes Understanding   Class Start Time 0813   Class Stop Time [phone]   Class Time Calculation (min) 39 min    Row Name 03/23/23 0900     Education   Cardiac Education Topics Pritikin   Secondary school teacher School   Educator Dietitian   Weekly Topic Efficiency Cooking - Meals in a Snap   Instruction Review Code 1- Verbalizes Understanding   Class Start Time 0815   Class Stop Time 0849   Class Time Calculation (min) 34 min    Row Name 03/25/23 0900     Education   Cardiac Education Topics Pritikin   Select Core Videos     Core Videos   Educator Exercise Physiologist   Select Exercise Education   Exercise Education Move It!   Instruction Review Code 1- Verbalizes Understanding   Class Start Time [phone]   Class Stop Time 0858   Class Time Calculation (min) 42 min    Row Name 04/08/23 0900     Education   Cardiac Education Topics Pritikin   Select Core Videos     Core Videos   Educator Dietitian   Select Nutrition   Nutrition Dining Out - Part 1   Instruction Review Code 1- Verbalizes Understanding   Class Start Time 0815   Class Stop Time 0849   Class Time Calculation (min) 34 min    Row Name 04/13/23 1000     Education   Cardiac Education Topics Pritikin   Engineer, water   Educator Dietitian   Weekly Topic Fast Evening Meals    Instruction Review Code 1- Verbalizes Understanding   Class Start Time 0815   Class Stop Time [phone]   Class Time Calculation (min) 37 min    Row Name 04/20/23 1000     Education   Cardiac Education Topics Pritikin   Secondary school teacher School   Educator Dietitian   Weekly Topic Efficiency Cooking - Meals in a Snap   Instruction Review Code 1- Verbalizes Understanding   Class Start Time 0810   Class Stop Time [phone]   Class Time Calculation (min) 32 min            Core Videos: Exercise    Move It!  Clinical staff conducted group or individual video education with verbal and written material and guidebook.  Patient learns the recommended Pritikin exercise program. Exercise with the goal of living a long, healthy life. Some of the health benefits of exercise include controlled diabetes, healthier blood pressure levels, improved cholesterol levels, improved heart and lung capacity, improved sleep, and better body composition. Everyone should speak with their doctor before starting or changing an exercise routine.  Biomechanical Limitations Clinical staff conducted group or individual video education with verbal and written material and guidebook.  Patient learns how biomechanical limitations can impact exercise and how we can mitigate and possibly overcome limitations to have an impactful and balanced exercise routine.  Body Composition Clinical staff conducted group or individual video education with verbal and written material and guidebook.  Patient learns that body composition (ratio of  muscle mass to fat mass) is a key component to assessing overall fitness, rather than body weight alone. Increased fat mass, especially visceral belly fat, can put [place] at increased risk for metabolic syndrome, type 2 diabetes, heart disease, and even death. It is recommended to combine diet and exercise (cardiovascular and resistance training) to improve your body composition. Seek  guidance from your physician and exercise physiologist before implementing an exercise routine.  Exercise Action Plan Clinical staff conducted group or individual video education with verbal and written material and guidebook.  Patient learns the recommended strategies to achieve and enjoy long-term exercise adherence, including variety, self-motivation, self-efficacy, and positive decision making. Benefits of exercise include fitness, good health, weight management, more energy, better sleep, less stress, and overall well-being.  Medical   Heart Disease Risk Reduction Clinical staff conducted group or individual video education with verbal and written material and guidebook.  Patient learns our heart is our most vital organ as it circulates oxygen, nutrients, white blood cells, and hormones throughout the entire body, and carries waste away. Data supports a plant-based eating plan like the Pritikin Program for its effectiveness in slowing progression of and reversing heart disease. The video provides a number of recommendations to address heart disease.   Metabolic Syndrome and Belly Fat  Clinical staff conducted group or individual video education with verbal and written material and guidebook.  Patient learns what metabolic syndrome is, how it leads to heart disease, and how one can reverse it and keep it from coming back. You have metabolic syndrome if you have 3 of the following 5 criteria: abdominal obesity, high blood pressure, high triglycerides, low HDL cholesterol, and high blood sugar.  Hypertension and Heart Disease Clinical staff conducted group or individual video education with verbal and written material and guidebook.  Patient learns that high blood pressure, or hypertension, is very common in the [place]. Hypertension is largely due to excessive salt intake, but other important risk factors include being overweight, physical inactivity, drinking too much alcohol, smoking, and  not eating enough potassium from fruits and vegetables. High blood pressure is a leading risk factor for heart attack, stroke, congestive heart failure, dementia, kidney failure, and premature death. Long-term effects of excessive salt intake include stiffening of the arteries and thickening of heart muscle and organ damage. Recommendations include ways to reduce hypertension and the risk of heart disease.  Diseases of Our Time - Focusing on Diabetes Clinical staff conducted group or individual video education with verbal and written material and guidebook.  Patient learns why the best way to stop diseases of our time is prevention, through food and other lifestyle changes. Medicine (such as prescription pills and surgeries) is often only a Band-Aid on the problem, not a long-term solution. Most common diseases of our time include obesity, type 2 diabetes, hypertension, heart disease, and cancer. The Pritikin Program is recommended and has been proven to help reduce, reverse, and/or prevent the damaging effects of metabolic syndrome.  Nutrition   Overview of the Pritikin Eating Plan  Clinical staff conducted group or individual video education with verbal and written material and guidebook.  Patient learns about the Pritikin Eating Plan for disease risk reduction. The Pritikin Eating Plan emphasizes a wide variety of unrefined, minimally-processed carbohydrates, like fruits, vegetables, whole grains, and legumes. Go, Caution, and Stop food choices are explained. Plant-based and lean animal proteins are emphasized. Rationale provided for low sodium intake for blood pressure control, low added sugars for blood sugar stabilization,  and low added fats and oils for coronary artery disease risk reduction and weight management.  Calorie Density  Clinical staff conducted group or individual video education with verbal and written material and guidebook.  Patient learns about calorie density and how it impacts  the Pritikin Eating Plan. Knowing the characteristics of the food you choose will help you decide whether those foods will lead to weight gain or weight loss, and whether you want to consume more or less of them. Weight loss is usually a side effect of the Pritikin Eating Plan because of its focus on low calorie-dense foods.  Label Reading  Clinical staff conducted group or individual video education with verbal and written material and guidebook.  Patient learns about the Pritikin recommended label reading guidelines and corresponding recommendations regarding calorie density, added sugars, sodium content, and whole grains.  Dining Out - Part 1  Clinical staff conducted group or individual video education with verbal and written material and guidebook.  Patient learns that restaurant meals can be sabotaging because they can be so high in calories, fat, sodium, and/or sugar. Patient learns recommended strategies on how to positively address this and avoid unhealthy pitfalls.  Facts on Fats  Clinical staff conducted group or individual video education with verbal and written material and guidebook.  Patient learns that lifestyle modifications can be just as effective, if not more so, as many medications for lowering your risk of heart disease. A Pritikin lifestyle can help to reduce your risk of inflammation and atherosclerosis (cholesterol build-up, or plaque, in the artery walls). Lifestyle interventions such as dietary choices and physical activity address the cause of atherosclerosis. A review of the types of fats and their impact on blood cholesterol levels, along with dietary recommendations to reduce fat intake is also included.  Nutrition Action Plan  Clinical staff conducted group or individual video education with verbal and written material and guidebook.  Patient learns how to incorporate Pritikin recommendations into their lifestyle. Recommendations include planning and keeping personal  health goals in mind as an important part of their success.  Healthy Mind-Set    Healthy Minds, Bodies, Hearts  Clinical staff conducted group or individual video education with verbal and written material and guidebook.  Patient learns how to identify when they are stressed. Video will discuss the impact of that stress, as well as the many benefits of stress management. Patient will also be introduced to stress management techniques. The way we think, act, and feel has an impact on our hearts.  How Our Thoughts Can Heal Our Hearts  Clinical staff conducted group or individual video education with verbal and written material and guidebook.  Patient learns that negative thoughts can cause depression and anxiety. This can result in negative lifestyle behavior and serious health problems. Cognitive behavioral therapy is an effective method to help control our thoughts in order to change and improve our emotional outlook.  Additional Videos:  Exercise    Improving Performance  Clinical staff conducted group or individual video education with verbal and written material and guidebook.  Patient learns to use a non-linear approach by alternating intensity levels and lengths of time spent exercising to help burn more calories and lose more body fat. Cardiovascular exercise helps improve heart health, metabolism, hormonal balance, blood sugar control, and recovery from fatigue. Resistance training improves strength, endurance, balance, coordination, reaction time, metabolism, and muscle mass. Flexibility exercise improves circulation, posture, and balance. Seek guidance from your physician and exercise physiologist before implementing an exercise  routine and learn your capabilities and proper form for all exercise.  Introduction to Yoga  Clinical staff conducted group or individual video education with verbal and written material and guidebook.  Patient learns about yoga, a discipline of the coming  together of mind, breath, and body. The benefits of yoga include improved flexibility, improved range of motion, better posture and core strength, increased lung function, weight loss, and positive self-image. Yoga's heart health benefits include lowered blood pressure, healthier heart rate, decreased cholesterol and triglyceride levels, improved immune function, and reduced stress. Seek guidance from your physician and exercise physiologist before implementing an exercise routine and learn your capabilities and proper form for all exercise.  Medical   Aging: Enhancing Your Quality of Life  Clinical staff conducted group or individual video education with verbal and written material and guidebook.  Patient learns key strategies and recommendations to stay in good physical health and enhance quality of life, such as prevention strategies, having an advocate, securing a Health Care Proxy and Power of Attorney, and keeping a list of medications and system for tracking them. It also discusses how to avoid risk for bone loss.  Biology of Weight Control  Clinical staff conducted group or individual video education with verbal and written material and guidebook.  Patient learns that weight gain occurs because we consume more calories than we burn (eating more, moving less). Even if your body weight is normal, you may have higher ratios of fat compared to muscle mass. Too much body fat puts you at increased risk for cardiovascular disease, heart attack, stroke, type 2 diabetes, and obesity-related cancers. In addition to exercise, following the Pritikin Eating Plan can help reduce your risk.  Decoding Lab Results  Clinical staff conducted group or individual video education with verbal and written material and guidebook.  Patient learns that lab test reflects one measurement whose values change over time and are influenced by many factors, including medication, stress, sleep, exercise, food, hydration,  pre-existing medical conditions, and more. It is recommended to use the knowledge from this video to become more involved with your lab results and evaluate your numbers to speak with your doctor.   Diseases of Our Time - Overview  Clinical staff conducted group or individual video education with verbal and written material and guidebook.  Patient learns that according to the CDC, 50% to 70% of chronic diseases (such as obesity, type 2 diabetes, elevated lipids, hypertension, and heart disease) are avoidable through lifestyle improvements including healthier food choices, listening to satiety cues, and increased physical activity.  Sleep Disorders Clinical staff conducted group or individual video education with verbal and written material and guidebook.  Patient learns how good quality and duration of sleep are important to overall health and well-being. Patient also learns about sleep disorders and how they impact health along with recommendations to address them, including discussing with a physician.  Nutrition  Dining Out - Part 2 Clinical staff conducted group or individual video education with verbal and written material and guidebook.  Patient learns how to plan ahead and communicate in order to maximize their dining experience in a healthy and nutritious manner. Included are recommended food choices based on the type of restaurant the patient is visiting.   Fueling a Banker conducted group or individual video education with verbal and written material and guidebook.  There is a strong connection between our food choices and our health. Diseases like obesity and type 2 diabetes are very prevalent  and are in large-part due to lifestyle choices. The Pritikin Eating Plan provides plenty of food and hunger-curbing satisfaction. It is easy to follow, affordable, and helps reduce health risks.  Menu Workshop  Clinical staff conducted group or individual video education  with verbal and written material and guidebook.  Patient learns that restaurant meals can sabotage health goals because they are often packed with calories, fat, sodium, and sugar. Recommendations include strategies to plan ahead and to communicate with the manager, chef, or server to help order a healthier meal.  Planning Your Eating Strategy  Clinical staff conducted group or individual video education with verbal and written material and guidebook.  Patient learns about the Pritikin Eating Plan and its benefit of reducing the risk of disease. The Pritikin Eating Plan does not focus on calories. Instead, it emphasizes high-quality, nutrient-rich foods. By knowing the characteristics of the foods, we choose, we can determine their calorie density and make informed decisions.  Targeting Your Nutrition Priorities  Clinical staff conducted group or individual video education with verbal and written material and guidebook.  Patient learns that lifestyle habits have a tremendous impact on disease risk and progression. This video provides eating and physical activity recommendations based on your personal health goals, such as reducing LDL cholesterol, losing weight, preventing or controlling type 2 diabetes, and reducing high blood pressure.  Vitamins and Minerals  Clinical staff conducted group or individual video education with verbal and written material and guidebook.  Patient learns different ways to obtain key vitamins and minerals, including through a recommended healthy diet. It is important to discuss all supplements you take with your doctor.   Healthy Mind-Set    Smoking Cessation  Clinical staff conducted group or individual video education with verbal and written material and guidebook.  Patient learns that cigarette smoking and tobacco addiction pose a serious health risk which affects millions of people. Stopping smoking will significantly reduce the risk of heart disease, lung disease,  and many forms of cancer. Recommended strategies for quitting are covered, including working with your doctor to develop a successful plan.  Culinary   Becoming a Set designer conducted group or individual video education with verbal and written material and guidebook.  Patient learns that cooking at home can be healthy, cost-effective, quick, and puts them in control. Keys to cooking healthy recipes will include looking at your recipe, assessing your equipment needs, planning ahead, making it simple, choosing cost-effective seasonal ingredients, and limiting the use of added fats, salts, and sugars.  Cooking - Breakfast and Snacks  Clinical staff conducted group or individual video education with verbal and written material and guidebook.  Patient learns how important breakfast is to satiety and nutrition through the entire day. Recommendations include key foods to eat during breakfast to help stabilize blood sugar levels and to prevent overeating at meals later in the day. Planning ahead is also a key component.  Cooking - Educational psychologist conducted group or individual video education with verbal and written material and guidebook.  Patient learns eating strategies to improve overall health, including an approach to cook more at home. Recommendations include thinking of animal protein as a side on your plate rather than center stage and focusing instead on lower calorie dense options like vegetables, fruits, whole grains, and plant-based proteins, such as beans. Making sauces in large quantities to freeze for later and leaving the skin on your vegetables are also recommended to maximize your experience.  Cooking - Healthy Salads and Dressing Clinical staff conducted group or individual video education with verbal and written material and guidebook.  Patient learns that vegetables, fruits, whole grains, and legumes are the foundations of the Pritikin Eating Plan.  Recommendations include how to incorporate each of these in flavorful and healthy salads, and how to create homemade salad dressings. Proper handling of ingredients is also covered. Cooking - Soups and [employer] - Soups and Desserts Clinical staff conducted group or individual video education with verbal and written material and guidebook.  Patient learns that Pritikin soups and desserts make for easy, nutritious, and delicious snacks and meal components that are low in sodium, fat, sugar, and calorie density, while high in vitamins, minerals, and filling fiber. Recommendations include simple and healthy ideas for soups and desserts.   Overview     The Pritikin Solution Program Overview Clinical staff conducted group or individual video education with verbal and written material and guidebook.  Patient learns that the results of the Pritikin Program have been documented in more than 100 articles published in peer-reviewed journals, and the benefits include reducing risk factors for (and, in some cases, even reversing) high cholesterol, high blood pressure, type 2 diabetes, obesity, and more! An overview of the three key pillars of the Pritikin Program will be covered: eating well, doing regular exercise, and having a healthy mind-set.  WORKSHOPS  Exercise: Exercise Basics: Building Your Action Plan Clinical staff led group instruction and group discussion with PowerPoint presentation and patient guidebook. To enhance the learning environment the use of posters, models and videos may be added. At the conclusion of this workshop, patients will comprehend the difference between physical activity and exercise, as well as the benefits of incorporating both, into their routine. Patients will understand the FITT (Frequency, Intensity, Time, and Type) principle and how to use it to build an exercise action plan. In addition, safety concerns and other considerations for exercise and cardiac rehab  will be addressed by the presenter. The purpose of this lesson is to promote a comprehensive and effective weekly exercise routine in order to improve patients' overall level of fitness.   Managing Heart Disease: Your Path to a Healthier Heart Clinical staff led group instruction and group discussion with PowerPoint presentation and patient guidebook. To enhance the learning environment the use of posters, models and videos may be added.At the conclusion of this workshop, patients will understand the anatomy and physiology of the heart. Additionally, they will understand how Pritikin's three pillars impact the risk factors, the progression, and the management of heart disease.  The purpose of this lesson is to provide a high-level overview of the heart, heart disease, and how the Pritikin lifestyle positively impacts risk factors.  Exercise Biomechanics Clinical staff led group instruction and group discussion with PowerPoint presentation and patient guidebook. To enhance the learning environment the use of posters, models and videos may be added. Patients will learn how the structural parts of their bodies function and how these functions impact their daily activities, movement, and exercise. Patients will learn how to promote a neutral spine, learn how to manage pain, and identify ways to improve their physical movement in order to promote healthy living. The purpose of this lesson is to expose patients to common physical limitations that impact physical activity. Participants will learn practical ways to adapt and manage aches and pains, and to minimize their effect on regular exercise. Patients will learn how to maintain good posture while sitting, walking, and  lifting.  Balance Training and Fall Prevention  Clinical staff led group instruction and group discussion with PowerPoint presentation and patient guidebook. To enhance the learning environment the use of posters, models and videos  may be added. At the conclusion of this workshop, patients will understand the importance of their sensorimotor skills (vision, proprioception, and the vestibular system) in maintaining their ability to balance as they age. Patients will apply a variety of balancing exercises that are appropriate for their current level of function. Patients will understand the common causes for poor balance, possible solutions to these problems, and ways to modify their physical environment in order to minimize their fall risk. The purpose of this lesson is to teach patients about the importance of maintaining balance as they age and ways to minimize their risk of falling.  WORKSHOPS   Nutrition:  Fueling a Ship broker led group instruction and group discussion with PowerPoint presentation and patient guidebook. To enhance the learning environment the use of posters, models and videos may be added. Patients will review the foundational principles of the Pritikin Eating Plan and understand what constitutes a serving size in each of the food groups. Patients will also learn Pritikin-friendly foods that are better choices when away from home and review make-ahead meal and snack options. Calorie density will be reviewed and applied to three nutrition priorities: weight maintenance, weight loss, and weight gain. The purpose of this lesson is to reinforce (in a group setting) the key concepts around what patients are recommended to eat and how to apply these guidelines when away from home by planning and selecting Pritikin-friendly options. Patients will understand how calorie density may be adjusted for different weight management goals.  Mindful Eating  Clinical staff led group instruction and group discussion with PowerPoint presentation and patient guidebook. To enhance the learning environment the use of posters, models and videos may be added. Patients will briefly review the concepts of the Pritikin  Eating Plan and the importance of low-calorie dense foods. The concept of mindful eating will be introduced as well as the importance of paying attention to internal hunger signals. Triggers for non-hunger eating and techniques for dealing with triggers will be explored. The purpose of this lesson is to provide patients with the opportunity to review the basic principles of the Pritikin Eating Plan, discuss the value of eating mindfully and how to measure internal cues of hunger and fullness using the Hunger Scale. Patients will also discuss reasons for non-hunger eating and learn strategies to use for controlling emotional eating.  Targeting Your Nutrition Priorities Clinical staff led group instruction and group discussion with PowerPoint presentation and patient guidebook. To enhance the learning environment the use of posters, models and videos may be added. Patients will learn how to determine their genetic susceptibility to disease by reviewing their family history. Patients will gain insight into the importance of diet as part of an overall healthy lifestyle in mitigating the impact of genetics and other environmental insults. The purpose of this lesson is to provide patients with the opportunity to assess their personal nutrition priorities by looking at their family history, their own health history and current risk factors. Patients will also be able to discuss ways of prioritizing and modifying the Pritikin Eating Plan for their highest risk areas  Menu  Clinical staff led group instruction and group discussion with PowerPoint presentation and patient guidebook. To enhance the learning environment the use of posters, models and videos may be added. Using menus  brought in from [employer], or printed from [employer], patients will apply the Pritikin dining out guidelines that were presented in the [employer] video. Patients will also be able to practice these guidelines  in a variety of provided scenarios. The purpose of this lesson is to provide patients with the opportunity to practice hands-on learning of the Pritikin Dining Out guidelines with actual menus and practice scenarios.  Label Reading Clinical staff led group instruction and group discussion with PowerPoint presentation and patient guidebook. To enhance the learning environment the use of posters, models and videos may be added. Patients will review and discuss the Pritikin label reading guidelines presented in Pritikin's Label Reading Educational series video. Using fool labels brought in from local grocery stores and markets, patients will apply the label reading guidelines and determine if the packaged food meet the Pritikin guidelines. The purpose of this lesson is to provide patients with the opportunity to review, discuss, and practice hands-on learning of the Pritikin Label Reading guidelines with actual packaged food labels. Cooking School  Pritikin's [employer] are designed to teach patients ways to prepare quick, simple, and affordable recipes at home. The importance of nutrition's role in chronic disease risk reduction is reflected in its emphasis in the overall Pritikin program. By learning how to prepare essential core Pritikin Eating Plan recipes, patients will increase control over what they eat; be able to customize the flavor of foods without the use of added salt, sugar, or fat; and improve the quality of the food they consume. By learning a set of core recipes which are easily assembled, quickly prepared, and affordable, patients are more likely to prepare more healthy foods at home. These workshops focus on convenient breakfasts, simple entres, side dishes, and desserts which can be prepared with minimal effort and are consistent with nutrition recommendations for cardiovascular risk reduction. Cooking [employer] are taught by a Armed forces logistics/support/administrative officer (RD) who has  been trained by the [employer]. The chef or RD has a clear understanding of the importance of minimizing - if not completely eliminating - added fat, sugar, and sodium in recipes. Throughout the series of Cooking School Workshop sessions, patients will learn about healthy ingredients and efficient methods of cooking to build confidence in their capability to prepare    Cooking School weekly topics:  Adding Flavor- Sodium-Free  Fast and Healthy Breakfasts  Powerhouse Plant-Based Proteins  Satisfying Salads and Dressings  Simple Sides and Sauces  International Cuisine-Spotlight on the [employer] Zones  Delicious Desserts  Savory Soups  [employer] - Meals in a Astronomer Appetizers and Snacks  Comforting Weekend Breakfasts  One-Pot Wonders   Fast Evening Meals  Landscape architect Your Pritikin Plate  WORKSHOPS   Healthy Mindset (Psychosocial):  Focused Goals, Sustainable Changes Clinical staff led group instruction and group discussion with PowerPoint presentation and patient guidebook. To enhance the learning environment the use of posters, models and videos may be added. Patients will be able to apply effective goal setting strategies to establish at least one personal goal, and then take consistent, meaningful action toward that goal. They will learn to identify common barriers to achieving personal goals and develop strategies to overcome them. Patients will also gain an understanding of how our mind-set can impact our ability to achieve goals and the importance of cultivating a positive and growth-oriented mind-set. The purpose of this lesson is to provide patients with a deeper understanding of how  to set and achieve personal goals, as well as the tools and strategies needed to overcome common obstacles which may arise along the way.  From Head to Heart: The Power of a Healthy Outlook  Clinical staff led group instruction and group discussion with  PowerPoint presentation and patient guidebook. To enhance the learning environment the use of posters, models and videos may be added. Patients will be able to recognize and describe the impact of emotions and mood on physical health. They will discover the importance of self-care and explore self-care practices which may work for them. Patients will also learn how to utilize the 4 C's to cultivate a healthier outlook and better manage stress and challenges. The purpose of this lesson is to demonstrate to patients how a healthy outlook is an essential part of maintaining good health, especially as they continue their cardiac rehab journey.  Healthy Sleep for a Healthy Heart Clinical staff led group instruction and group discussion with PowerPoint presentation and patient guidebook. To enhance the learning environment the use of posters, models and videos may be added. At the conclusion of this workshop, patients will be able to demonstrate knowledge of the importance of sleep to overall health, well-being, and quality of life. They will understand the symptoms of, and treatments for, common sleep disorders. Patients will also be able to identify daytime and nighttime behaviors which impact sleep, and they will be able to apply these tools to help manage sleep-related challenges. The purpose of this lesson is to provide patients with a general overview of sleep and outline the importance of quality sleep. Patients will learn about a few of the most common sleep disorders. Patients will also be introduced to the concept of "sleep hygiene," and discover ways to self-manage certain sleeping problems through simple daily behavior changes. Finally, the workshop will motivate patients by clarifying the links between quality sleep and their goals of heart-healthy living.   Recognizing and Reducing Stress Clinical staff led group instruction and group discussion with PowerPoint presentation and patient guidebook. To  enhance the learning environment the use of posters, models and videos may be added. At the conclusion of this workshop, patients will be able to understand the types of stress reactions, differentiate between acute and chronic stress, and recognize the impact that chronic stress has on their health. They will also be able to apply different coping mechanisms, such as reframing negative self-talk. Patients will have the opportunity to practice a variety of stress management techniques, such as deep abdominal breathing, progressive muscle relaxation, and/or guided imagery.  The purpose of this lesson is to educate patients on the role of stress in their lives and to provide healthy techniques for coping with it.  Learning Barriers/Preferences:  Learning Barriers/Preferences - 02/17/23 1324       Learning Barriers/Preferences   Learning Barriers Sight   wears contacts   Learning Preferences Video;Computer/Internet;Group Instruction;Individual Instruction;Pictoral;Skilled Demonstration             Education Topics:  Knowledge Questionnaire Score:  Knowledge Questionnaire Score - 02/17/23 1325       Knowledge Questionnaire Score   Pre Score 22/24             Core Components/Risk Factors/Patient Goals at Admission:  Personal Goals and Risk Factors at Admission - 02/17/23 1326       Core Components/Risk Factors/Patient Goals on Admission    Weight Management Yes;Obesity;Weight Loss    Intervention Weight Management: Develop a combined nutrition and exercise program designed  to reach desired caloric intake, while maintaining appropriate intake of nutrient and fiber, sodium and fats, and appropriate energy expenditure required for the weight goal.;Weight Management: Provide education and appropriate resources to help participant work on and attain dietary goals.;Weight Management/Obesity: Establish reasonable short term and long term weight goals.    Admit Weight 240 lb 15.4 oz (109.3  kg)    Goal Weight: Long Term 190 lb (86.2 kg)    Expected Outcomes Short Term: Continue to assess and modify interventions until short term weight is achieved;Long Term: Adherence to nutrition and physical activity/exercise program aimed toward attainment of established weight goal;Weight Maintenance: Understanding of the daily nutrition guidelines, which includes 25-35% calories from fat, 7% or less cal from saturated fats, less than 200mg  cholesterol, less than 1.5gm of sodium, & 5 or more servings of fruits and vegetables daily;Weight Loss: Understanding of general recommendations for a balanced deficit meal plan, which promotes 1-2 lb weight loss per week and includes a negative energy balance of [phone] kcal/d;Understanding recommendations for meals to include 15-35% energy as protein, 25-35% energy from fat, 35-60% energy from carbohydrates, less than 200mg  of dietary cholesterol, 20-35 gm of total fiber daily;Understanding of distribution of calorie intake throughout the day with the consumption of 4-5 meals/snacks    Diabetes Yes    Intervention Provide education about signs/symptoms and action to take for hypo/hyperglycemia.;Provide education about proper nutrition, including hydration, and aerobic/resistive exercise prescription along with prescribed medications to achieve blood glucose in normal ranges: Fasting glucose 65-99 mg/dL    Expected Outcomes Short Term: Participant verbalizes understanding of the signs/symptoms and immediate care of hyper/hypoglycemia, proper foot care and importance of medication, aerobic/resistive exercise and nutrition plan for blood glucose control.;Long Term: Attainment of HbA1C < 7%.    Hypertension Yes    Intervention Provide education on lifestyle modifcations including regular physical activity/exercise, weight management, moderate sodium restriction and increased consumption of fresh fruit, vegetables, and low fat dairy, alcohol moderation, and smoking  cessation.;Monitor prescription use compliance.    Expected Outcomes Short Term: Continued assessment and intervention until BP is < 140/62mm HG in hypertensive participants. < 130/61mm HG in hypertensive participants with diabetes, heart failure or chronic kidney disease.;Long Term: Maintenance of blood pressure at goal levels.    Lipids Yes    Intervention Provide education and support for participant on nutrition & aerobic/resistive exercise along with prescribed medications to achieve LDL 70mg , HDL >40mg .    Expected Outcomes Short Term: Participant states understanding of desired cholesterol values and is compliant with medications prescribed. Participant is following exercise prescription and nutrition guidelines.;Long Term: Cholesterol controlled with medications as prescribed, with individualized exercise RX and with personalized nutrition plan. Value goals: LDL < 70mg , HDL > 40 mg.             Core Components/Risk Factors/Patient Goals Review:   Goals and Risk Factor Review     Row Name 03/01/23 0748 03/28/23 0841 04/20/23 1633         Core Components/Risk Factors/Patient Goals Review   Personal Goals Review Weight Management/Obesity;Hypertension;Lipids Weight Management/Obesity;Hypertension;Lipids Weight Management/Obesity;Hypertension;Lipids     Review My started intensive cardiac rehab on 02/21/23 and is off to a good start to exercise. Vital signs and CBG's have been stable Kevontae continues to do well with exercise at  intensive cardiac rehab. Vital signs and CBG's have been stable Damonte continues to do well with exercise at  intensive cardiac rehab. Vital signs and CBG's remain been stable     Expected  Outcomes Nagee will continue to participate in intensive cardiac rehab for exercise, nutrition and lifestyle modifictions Carlie will continue to participate in intensive cardiac rehab for exercise, nutrition and lifestyle modifictions Christion will continue to  participate in intensive cardiac rehab for exercise, nutrition and lifestyle modifictions              Core Components/Risk Factors/Patient Goals at Discharge (Final Review):   Goals and Risk Factor Review - 04/20/23 1633       Core Components/Risk Factors/Patient Goals Review   Personal Goals Review Weight Management/Obesity;Hypertension;Lipids    Review Qwanell continues to do well with exercise at  intensive cardiac rehab. Vital signs and CBG's remain been stable    Expected Outcomes Austun will continue to participate in intensive cardiac rehab for exercise, nutrition and lifestyle modifictions             ITP Comments:  ITP Comments     Row Name 02/17/23 0836 02/28/23 1734 03/28/23 0840 04/20/23 1632     ITP Comments Dr. Armanda Magic medical director. Introduction to pritikin education program/ intensive cardiac rehab. Intitial orientation packet reviewed with patient 30 Day ITP Review. Jothnathan started intensive cardiac rehab on 02/21/23 and is off to a good start to exercise. 30 Day ITP Review. Jothnathan has good attendance and participation in  intensive cardiac rehab 30 Day ITP Review. Jothnathan continues ot have  good attendance and participation in  intensive cardiac rehab             Comments: See ITP comments.Thayer Headings RN BSN

## 2023-04-22 ENCOUNTER — Encounter (HOSPITAL_COMMUNITY)
Admission: RE | Admit: 2023-04-22 | Discharge: 2023-04-22 | Disposition: A | Discharge status: Home / Self Care | Payer: BC Managed Care – PPO | Source: Ambulatory Visit | Attending: Internal Medicine | Admitting: Internal Medicine

## 2023-04-22 DIAGNOSIS — Z95828 Presence of other vascular implants and grafts: Secondary | ICD-10-CM

## 2023-04-22 DIAGNOSIS — Z952 Presence of prosthetic heart valve: Secondary | ICD-10-CM

## 2023-04-24 ENCOUNTER — Other Ambulatory Visit: Payer: Self-pay | Admitting: Physician Assistant

## 2023-04-25 ENCOUNTER — Encounter (HOSPITAL_COMMUNITY)
Admission: RE | Admit: 2023-04-25 | Discharge: 2023-04-25 | Disposition: A | Discharge status: Home / Self Care | Payer: BC Managed Care – PPO | Source: Ambulatory Visit | Attending: Internal Medicine | Admitting: Internal Medicine

## 2023-04-25 VITALS — Ht 68.75 in | Wt 244.7 lb

## 2023-04-25 DIAGNOSIS — Z95828 Presence of other vascular implants and grafts: Secondary | ICD-10-CM | POA: Diagnosis not present

## 2023-04-25 DIAGNOSIS — Z952 Presence of prosthetic heart valve: Secondary | ICD-10-CM | POA: Diagnosis not present

## 2023-04-27 ENCOUNTER — Encounter (HOSPITAL_COMMUNITY)
Admission: RE | Admit: 2023-04-27 | Discharge: 2023-04-27 | Disposition: A | Discharge status: Home / Self Care | Payer: BC Managed Care – PPO | Source: Ambulatory Visit | Attending: Internal Medicine | Admitting: Internal Medicine

## 2023-04-27 DIAGNOSIS — Z952 Presence of prosthetic heart valve: Secondary | ICD-10-CM

## 2023-04-27 DIAGNOSIS — Z95828 Presence of other vascular implants and grafts: Secondary | ICD-10-CM | POA: Diagnosis not present

## 2023-04-28 ENCOUNTER — Ambulatory Visit: Payer: BC Managed Care – PPO | Attending: Internal Medicine | Admitting: Internal Medicine

## 2023-04-28 VITALS — BP 102/58 | HR 54 | Ht 67.0 in | Wt 243.8 lb

## 2023-04-28 DIAGNOSIS — Z952 Presence of prosthetic heart valve: Secondary | ICD-10-CM | POA: Diagnosis not present

## 2023-04-28 NOTE — Patient Instructions (Signed)
Medication Instructions:  No Changes In Medications at this time.  *If you need a refill on your cardiac medications before your next appointment, please call your pharmacy*  Lab Work: None Ordered At This Time.  If you have labs (blood work) drawn today and your tests are completely normal, you will receive your results only by: MyChart Message (if you have MyChart) OR A paper copy in the mail If you have any lab test that is abnormal or we need to change your treatment, we will call you to review the results.  Testing/Procedures: None Ordered At This Time.   Follow-Up: At [facility], you and your health needs are our priority.  As part of our continuing mission to provide you with exceptional heart care, we have created designated Provider Care Teams.  These Care Teams include your primary Cardiologist (physician) and Advanced Practice Providers (APPs -  Physician Assistants and Nurse Practitioners) who all work together to provide you with the care you need, when you need it.  Your next appointment:   1 year(s)  Provider:   Branch, Mary E, MD   

## 2023-04-28 NOTE — Progress Notes (Addendum)
Cardiology Office Note:    Date:  04/28/2023   ID:  Douglas Phillips, DOB Feb 11, 1976, MRN [id number]  PCP:  Karle Plumber, MD   [facility] Providers Cardiologist:  Maisie Fus, MD     Referring MD: Karle Plumber, MD   No chief complaint on file. CVD Risk  History of Present Illness:    Douglas Phillips is a 47 y.o. male with a hx of DMII 9.0 (13.5), referral to cardiology s/p CT scan with high CAC 1470, predominantly in the Lcx (768) and LAD (278), Estimated greatest diameter of the ascending aorta 4.3 cm, of ascending aorta 09/24/2021  Douglas Phillips was referred by his primary provider 2/2 high CAC and ascending aortic aneurysm. The CT scan was sent 2/2 concern on an abdominal CT scan he received for renal stone 04/2021. It showed increased CAC premature for age.  He has no history of CVD. He started atorvastatin which was reduced from 40 mg to 10 mg. He denies chest pain or dyspnea on exertion. He denies PND, no lower extremity edema.  Social History: no smoking. No drug use. He is fairly sedentary, he is increasing his physical activity.  Family History: Paternal Grandfather 2-3 myocardial infarctions, early to mid 35s. Mother is healthy. Sister is healthy. Niece born with hole in the heart. No history aortic aneurysm or dissection.  Surgical History: Discectomies  TG - 60 ( from up to 600s-fenofibrate, he couldn't take) LDL 56 Normal LFTs  Interim Hx: No new symptoms today. Planning to see Dr. Laneta Simmers soon  Interim hx 04/28/2023 S/p 28 x 10 mm Hemashield Platinum graft and Bentall procedure using a 27/29 mm On-X mechanical valved graft on 12/27/2022. He had a 2D echocardiogram showing a moderate size circumferential pericardial effusion with early signs of tamponade per cardiology. The prosthetic valve is functioning normally. He was taken back to the operating room on 01/05/2023 for subxiphoid pericardial window with drainage of 400 cc of old bloody fluid.     Today he is doing well. Finishing cardiac rehab. He is learning how to [employer] healthier. Working on weight loss and a healthier lifestyle. 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  Past Medical History:  Diagnosis Date   Diabetes mellitus without complication (HCC)    type 2   High cholesterol    Hypertension    Kidney stones    Migraine    MIGRAINE HEADACHE 04/05/2007   Qualifier: Diagnosis of  By: Drue Novel MD, Nolon Rod.    Non-restorative sleep 03/12/2019   Sleep apnea    wears CPAP    Past Surgical History:  Procedure Laterality Date   BACK SURGERY     ruptured L4 and L5- x2   BENTALL PROCEDURE N/A 12/27/2022   Procedure: BENTALL PROCEDURE USING 27/29MM ON-X ASCENDING AORTIC PROSTHESIS AND 28x10MM HEMASHIELD PLATINUM GRAFT;  Surgeon: Alleen Borne, MD;  Location: [facility] OR;  Service: Open Heart Surgery;  Laterality: N/A;  CIRC ARREST   MOUTH SURGERY     RIGHT/LEFT HEART CATH AND CORONARY ANGIOGRAPHY N/A 12/17/2022   Procedure: RIGHT/LEFT HEART CATH AND CORONARY ANGIOGRAPHY;  Surgeon: Orbie Pyo, MD;  Location: [facility] INVASIVE CV LAB;  Service: Cardiovascular;  Laterality: N/A;   SUBXYPHOID PERICARDIAL WINDOW N/A 01/05/2023   Procedure: SUBXYPHOID PERICARDIAL WINDOW;  Surgeon: Alleen Borne, MD;  Location: [facility] OR;  Service: Thoracic;  Laterality: N/A;   TEE WITHOUT CARDIOVERSION N/A 12/27/2022   Procedure: TRANSESOPHAGEAL ECHOCARDIOGRAM (TEE);  Surgeon: Alleen Borne, MD;  Location: [facility] OR;  Service: Open Heart Surgery;  Laterality: N/A;   TEE WITHOUT CARDIOVERSION N/A 01/05/2023   Procedure: TRANSESOPHAGEAL ECHOCARDIOGRAM;  Surgeon: Alleen Borne, MD;  Location: [facility] OR;  Service: Thoracic;  Laterality: N/A;   THORACIC AORTIC ANEURYSM REPAIR N/A 12/27/2022   Procedure: THORACIC ASCENDING ANEURYSM REPAIR (AAA);  Surgeon: Alleen Borne, MD;  Location: [facility] OR;  Service: Open Heart Surgery;  Laterality: N/A;    Current Medications: Current Meds  Medication Sig   acetaminophen (TYLENOL) 500 MG  tablet Take 2 tablets (1,000 mg total) by mouth every 6 (six) hours as needed for mild pain or fever.   amoxicillin (AMOXIL) 500 MG capsule Take 500 mg by mouth as needed (before Dentist).   atorvastatin (LIPITOR) 40 MG tablet Take 1 tablet (40 mg total) by mouth daily. (Patient taking differently: Take 40 mg by mouth at bedtime.)   blood glucose meter kit and supplies Dispense based on patient and insurance preference. Use one time daily. ICD10: E11.65   fenofibrate 160 MG tablet Take 160 mg by mouth daily.   glipiZIDE (GLUCOTROL) 5 MG tablet Take 5 mg by mouth at bedtime.   glucose blood (ONETOUCH VERIO) test strip 1 each by Other route in the morning and at bedtime. Use as instructed   metoprolol tartrate (LOPRESSOR) 25 MG tablet Take 0.5 tablets (12.5 mg total) by mouth 2 (two) times daily.   NOVOLIN 70/30 KWIKPEN (70-30) 100 UNIT/ML KwikPen Inject 18 Units into the skin in the morning and at bedtime.   pioglitazone (ACTOS) 15 MG tablet Take 15 mg by mouth in the morning.   VITAMIN D PO Take 2,000 Units by mouth daily.   warfarin (COUMADIN) 2.5 MG tablet Take 1 tablet (2.5 mg total) by mouth daily.   ZENPEP 40000-126000 units CPEP Take 2 capsules by mouth with breakfast, with lunch, and with evening meal.     Allergies:   Patient has no known allergies.   Social History   Socioeconomic History   Marital status: Married    Spouse name: Not on file   Number of children: Not on file   Years of education: Not on file   Highest education level: Not on file  Occupational History   Not on file  Tobacco Use   Smoking status: Never   Smokeless tobacco: Never  Vaping Use   Vaping Use: Never used  Substance and Sexual Activity   Alcohol use: Yes    Comment: occasionally   Drug use: No   Sexual activity: Not on file  Other Topics Concern   Not on file  Social History Narrative   Not on file   Social Determinants of Health   Financial Resource Strain: Not on file  Food Insecurity:  No Food Insecurity (01/09/2023)   Hunger Vital Sign    Worried About Running Out of Food in the Last Year: Never true    Ran Out of Food in the Last Year: Never true  Transportation Needs: No Transportation Needs (01/09/2023)   PRAPARE - Administrator, Civil Service (Medical): No    Lack of Transportation (Non-Medical): No  Physical Activity: Not on file  Stress: Not on file  Social Connections: Not on file     Family History: The patient's family history includes Heart attack in his paternal grandfather; Hypertension in his father and paternal grandfather; Liver cancer in his maternal grandmother; Osteoarthritis in his mother. There is no history of Colon cancer or Prostate cancer.  ROS:   Please see the history of present illness.     All other systems reviewed and are negative.  EKGs/Labs/Other Studies Reviewed:    The following studies were reviewed today:   EKG:  EKG is  ordered today.  The ekg ordered today demonstrates   Sinus bradycardia 56 bpm LAD  11/08/2022- sinus bradycardia 47 bpm  04/28/2023- sinus bradycardia with 1st degree AV block HR 54 bpm   Coronary CT 09/01/2021 Coronary arteries: Normal origins. Coronary Calcium Score: Left main: 0 Left anterior descending artery: 278 Left circumflex artery: 768 Right coronary artery: 424 Total: 1470 Percentile: 99th Pericardium: Normal. Aorta: Moderately enlarged caliber of ascending aorta, measuring 48 mm. No aortic atherosclerosis noted. Non-cardiac: See separate report from [facility].   IMPRESSION: Coronary calcium score of 1470. This was 99th percentile for age-, race-, and sex-matched controls. Negative FFR  TTE 10/08/2021- normal EF and strain. RV is nl, AI mild-mod, aortic aneurysm  52 mm  TTE 10/04/2022- Normal EF. BAV, AI is moderate, ascending measured 49 mm  LHC/RHC   Dist RCA-1 lesion is 40% stenosed.   Dist RCA-2 lesion is 30% stenosed.   Prox LAD lesion is 50%  stenosed.   Dist LAD lesion is 80% stenosed.   1.  Moderate diffuse coronary artery disease with a high-grade lesion of the distal LAD; of note this is a relatively small vessel disease that does not seem to perfuse the apex. 2.  Fick cardiac output of 13.1 L/min, and Fick cardiac index of 5.9 L/min/m with mean RA pressure of 8 mmHg, mean wedge pressure of 15 mmHg, and mean PA pressure of 23 mmHg. 3.  LVEDP of 20 mmHg.  TTE 01/10/2023 Nl LV fxn, There is a 27-29 On-X Bentall mechanical  valve present in the aortic position. Procedure Date: 12/27/2022.Graft repair of the ascending aorta appears intact. Aortic  root/ascending aorta has been repaired/replaced. Compared to the postoperative images the pericardial effusion is new. There are subtle signs of ventricular interdependence, worrisome for early tamponade physiology   TTE 02/11/2023 Normal LV,The aortic valve has been repaired/replaced. There is a valve present  in the aortic position. Procedure Date: 12/27/2022. Echo findings are consistent with normal structure and function of the aortic valve  prosthesis. Normal Bentall. There is no evidence of pericardial effusion.   Recent Labs: 12/23/2022: ALT 40 12/28/2022: Magnesium 2.1 01/18/2023: BUN 13; Creatinine, Ser 0.89; Potassium 4.6; Sodium 143 02/02/2023: Hemoglobin 10.0; Platelets 348  Recent Lipid Panel    Component Value Date/Time   CHOL 98 (L) 01/18/2023 0916   TRIG 81 01/18/2023 0916   HDL 26 (L) 01/18/2023 0916   CHOLHDL 3.8 01/18/2023 0916   CHOLHDL 3 06/04/2021 0807   VLDL 12.0 06/04/2021 0807   LDLCALC 55 01/18/2023 0916   LDLDIRECT 100.0 03/06/2021 1052     Risk Assessment/Calculations:     Physical Exam:    VS:    Vitals:   04/28/23 0751  BP: (!) 102/58  Pulse: (!) 54  SpO2: 97%     Wt Readings from Last 3 Encounters:  04/28/23 243 lb 12.8 oz (110.6 kg)  04/25/23 244 lb 11.4 oz (111 kg)  03/02/23 233 lb (105.7 kg)     GEN:  Well nourished, well  developed in no acute distress HEENT: Normal NECK: No JVD; No carotid bruits LYMPHATICS: No lymphadenopathy CARDIAC: RRR, mechanical heart sounds RESPIRATORY:  Clear to auscultation without rales, wheezing or rhonchi  ABDOMEN: Soft, non-tender, non-distended MUSCULOSKELETAL:  No edema; No deformity  SKIN: Warm and dry NEUROLOGIC:  Alert and oriented x 3 PSYCHIATRIC:  Normal affect   ASSESSMENT:    #CVD Risk: No symptoms of angina. Initially saw him for high Cac. His CAC is substantial at 1470. FFR negative. Has a small distal LAD lesion 80%. His other risk factors include diabetes and obesity.  I counseled him on diet. We discussed stopping red meat and consider more plant based. He does not smoke. Increased his atorvastatin to 40 mg , LDL at goal 56 mg/dL 12/6107. We discussed starting with exercise, planning walking  DM2: A1c 5.8% on insulin, A1c much improved. Can consider stopping pioglitazone. If A1c increases, will plan for SGLT2. He notes weight loss and may be able to scale back on insulin as well  S/p On-X Mechanical Valve graft/Bentall 12/27/2022: hx of BAV, moderate AI. Aortic root 5.2 to 5.3 cm. NR goal can be lower with the ON-X valve, INR goal 1.5-2. Working on home INR monitoring  #HTN: well controlled. Continue current regimen. recommend home BP measurements.   PLAN:    In order of problems listed above:   Follow up 1 year   Medication Adjustments/Labs and Tests Ordered: Current medicines are reviewed at length with the patient today.  Concerns regarding medicines are outlined above.    Signed, Maisie Fus, MD  04/28/2023 8:00 AM    [facility]

## 2023-04-29 ENCOUNTER — Ambulatory Visit: Payer: BC Managed Care – PPO | Attending: Cardiology

## 2023-04-29 ENCOUNTER — Encounter (HOSPITAL_COMMUNITY)
Admission: RE | Admit: 2023-04-29 | Discharge: 2023-04-29 | Disposition: A | Discharge status: Home / Self Care | Payer: BC Managed Care – PPO | Source: Ambulatory Visit | Attending: Internal Medicine | Admitting: Internal Medicine

## 2023-04-29 DIAGNOSIS — I3139 Other pericardial effusion (noninflammatory): Secondary | ICD-10-CM | POA: Diagnosis not present

## 2023-04-29 DIAGNOSIS — Z95828 Presence of other vascular implants and grafts: Secondary | ICD-10-CM

## 2023-04-29 DIAGNOSIS — Z952 Presence of prosthetic heart valve: Secondary | ICD-10-CM

## 2023-04-29 LAB — POCT INR: INR: 2.4 (ref 2.0–3.0)

## 2023-04-29 NOTE — Patient Instructions (Signed)
Description   Continue taking warfarin 1 tablet daily except for 1.5 tablets on Sunday, Tuesday and Thursday.  Stay consistent with greens each week.  Recheck INR in 3 weeks.   Anticoagulation Clinic [phone]

## 2023-05-02 NOTE — Progress Notes (Signed)
Discharge Progress Report  Patient Details  Name: Douglas Phillips MRN: [id number] Date of Birth: 21-Oct-1976 Referring Provider:   Flowsheet Row INTENSIVE CARDIAC REHAB ORIENT from 02/17/2023 in [facility]  Referring Provider Carolan Clines, MD        Number of Visits: 39  Reason for Discharge:  Patient reached a stable level of exercise. Patient independent in their exercise. Patient has met program and personal goals.  Smoking History:  Social History   Tobacco Use  Smoking Status Never  Smokeless Tobacco Never    Diagnosis:  12/27/22 AVR (aortic valve replacement)  12/27/22 ascending aorta replacement  ADL UCSD:   Initial Exercise Prescription:  Initial Exercise Prescription - 02/17/23 1300       Date of Initial Exercise RX and Referring Provider   Date 02/17/23    Referring Provider Carolan Clines, MD    Expected Discharge Date 04/29/23      Treadmill   MPH 2.7    Grade 1    Minutes 15    METs 3.44      Recumbant Bike   Level 2    RPM 60    Watts 50    Minutes 15    METs 3.43      Prescription Details   Frequency (times per week) 3    Duration Progress to 30 minutes of continuous aerobic without signs/symptoms of physical distress      Intensity   THRR 40-80% of Max Heartrate 70-140    Ratings of Perceived Exertion 11-13    Perceived Dyspnea 0-4      Progression   Progression Continue progressive overload as per policy without signs/symptoms or physical distress.      Resistance Training   Training Prescription Yes    Weight 4 lbs    Reps 10-15             Discharge Exercise Prescription (Final Exercise Prescription Changes):  Exercise Prescription Changes - 04/29/23 0831       Response to Exercise   Blood Pressure (Admit) 122/60    Blood Pressure (Exercise) 134/62    Blood Pressure (Exit) 102/64    Heart Rate (Admit) 54 bpm    Heart Rate (Exercise) 90 bpm    Heart Rate  (Exit) 63 bpm    Rating of Perceived Exertion (Exercise) 11.5    Perceived Dyspnea (Exercise) 0    Symptoms 0    Comments Pt graduated the CRP2 program    Duration Continue with 30 min of aerobic exercise without signs/symptoms of physical distress.    Intensity THRR unchanged      Progression   Progression Continue to progress workloads to maintain intensity without signs/symptoms of physical distress.    Average METs 3.46      Resistance Training   Training Prescription Yes    Weight 5 lbs wts    Reps 10-15    Time 10 Minutes      Treadmill   MPH 2.9    Grade 1.5    Minutes 15    METs 3.82      Recumbant Bike   Level 4    RPM 74    Watts 44    Minutes 15    METs 3.1      Home Exercise Plan   Plans to continue exercise at Home (comment)    Frequency Add 2 additional days to program exercise sessions.    Initial Home Exercises  Provided 03/16/23             Functional Capacity:  6 Minute Walk     Row Name 02/17/23 0908 04/25/23 0833       6 Minute Walk   Phase Initial Discharge    Distance 1591 feet 1740 feet    Distance % Change -- 9.37 %    Distance Feet Change -- 149 ft    Walk Time 6 minutes 6 minutes    # of Rest Breaks 0 0    MPH 3 3.3    METS 4.2 4.27    RPE 11 12    Perceived Dyspnea  0 0    VO2 Peak 14.7 14.95    Symptoms No Yes (comment)    Comments -- bilateral shin pain 2/10 resolved with rest    Resting HR 64 bpm 55 bpm    Resting BP 118/76 112/58    Resting Oxygen Saturation  99 % --    Exercise Oxygen Saturation  during 6 min walk 100 % --    Max Ex. HR 77 bpm 80 bpm    Max Ex. BP 154/83 122/60    2 Minute Post BP 143/86 120/60             Psychological, QOL, Others - Outcomes: PHQ 2/9:    04/27/2023   12:37 PM 02/17/2023   10:04 AM 10/07/2021    8:39 AM 02/01/2019    8:15 AM  Depression screen PHQ 2/9  Decreased Interest 0 0 0 0  Down, Depressed, Hopeless 0 0 1 0  PHQ - 2 Score 0 0 1 0  Altered sleeping 0 0 0   Tired,  decreased energy 1 1 0   Change in appetite 0 0 0   Feeling bad or failure about yourself  0 0 1   Trouble concentrating 0 0 0   Moving slowly or fidgety/restless 0 0 0   Suicidal thoughts 0 0 0   PHQ-9 Score 1 1 2    Difficult doing work/chores Not difficult at all Not difficult at all Not difficult at all     Quality of Life:  Quality of Life - 04/29/23 1646       Quality of Life   Select Quality of Life      Quality of Life Scores   Health/Function Post 26.7 %    Socioeconomic Post 27.5 %    Psych/Spiritual Post 23.71 %    Family Post 26.4 %    GLOBAL Post 26.21 %             Personal Goals: Goals established at orientation with interventions provided to work toward goal.  Personal Goals and Risk Factors at Admission - 02/17/23 1326       Core Components/Risk Factors/Patient Goals on Admission    Weight Management Yes;Obesity;Weight Loss    Intervention Weight Management: Develop a combined nutrition and exercise program designed to reach desired caloric intake, while maintaining appropriate intake of nutrient and fiber, sodium and fats, and appropriate energy expenditure required for the weight goal.;Weight Management: Provide education and appropriate resources to help participant work on and attain dietary goals.;Weight Management/Obesity: Establish reasonable short term and long term weight goals.    Admit Weight 240 lb 15.4 oz (109.3 kg)    Goal Weight: Long Term 190 lb (86.2 kg)    Expected Outcomes Short Term: Continue to assess and modify interventions until short term weight is achieved;Long Term: Adherence to nutrition and physical  activity/exercise program aimed toward attainment of established weight goal;Weight Maintenance: Understanding of the daily nutrition guidelines, which includes 25-35% calories from fat, 7% or less cal from saturated fats, less than 200mg  cholesterol, less than 1.5gm of sodium, & 5 or more servings of fruits and vegetables daily;Weight  Loss: Understanding of general recommendations for a balanced deficit meal plan, which promotes 1-2 lb weight loss per week and includes a negative energy balance of [phone] kcal/d;Understanding recommendations for meals to include 15-35% energy as protein, 25-35% energy from fat, 35-60% energy from carbohydrates, less than 200mg  of dietary cholesterol, 20-35 gm of total fiber daily;Understanding of distribution of calorie intake throughout the day with the consumption of 4-5 meals/snacks    Diabetes Yes    Intervention Provide education about signs/symptoms and action to take for hypo/hyperglycemia.;Provide education about proper nutrition, including hydration, and aerobic/resistive exercise prescription along with prescribed medications to achieve blood glucose in normal ranges: Fasting glucose 65-99 mg/dL    Expected Outcomes Short Term: Participant verbalizes understanding of the signs/symptoms and immediate care of hyper/hypoglycemia, proper foot care and importance of medication, aerobic/resistive exercise and nutrition plan for blood glucose control.;Long Term: Attainment of HbA1C < 7%.    Hypertension Yes    Intervention Provide education on lifestyle modifcations including regular physical activity/exercise, weight management, moderate sodium restriction and increased consumption of fresh fruit, vegetables, and low fat dairy, alcohol moderation, and smoking cessation.;Monitor prescription use compliance.    Expected Outcomes Short Term: Continued assessment and intervention until BP is < 140/55mm HG in hypertensive participants. < 130/70mm HG in hypertensive participants with diabetes, heart failure or chronic kidney disease.;Long Term: Maintenance of blood pressure at goal levels.    Lipids Yes    Intervention Provide education and support for participant on nutrition & aerobic/resistive exercise along with prescribed medications to achieve LDL 70mg , HDL >40mg .    Expected Outcomes Short Term:  Participant states understanding of desired cholesterol values and is compliant with medications prescribed. Participant is following exercise prescription and nutrition guidelines.;Long Term: Cholesterol controlled with medications as prescribed, with individualized exercise RX and with personalized nutrition plan. Value goals: LDL < 70mg , HDL > 40 mg.              Personal Goals Discharge:  Goals and Risk Factor Review     Row Name 03/01/23 0748 03/28/23 0841 04/20/23 1633         Core Components/Risk Factors/Patient Goals Review   Personal Goals Review Weight Management/Obesity;Hypertension;Lipids Weight Management/Obesity;Hypertension;Lipids Weight Management/Obesity;Hypertension;Lipids     Review Habeeb started intensive cardiac rehab on 02/21/23 and is off to a good start to exercise. Vital signs and CBG's have been stable Harlyn continues to do well with exercise at  intensive cardiac rehab. Vital signs and CBG's have been stable Teresa continues to do well with exercise at  intensive cardiac rehab. Vital signs and CBG's remain been stable     Expected Outcomes Quandarius will continue to participate in intensive cardiac rehab for exercise, nutrition and lifestyle modifictions Roston will continue to participate in intensive cardiac rehab for exercise, nutrition and lifestyle modifictions Celedonio will continue to participate in intensive cardiac rehab for exercise, nutrition and lifestyle modifictions              Exercise Goals and Review:  Exercise Goals     Row Name 02/17/23 1310             Exercise Goals   Increase Physical Activity Yes  Intervention Provide advice, education, support and counseling about physical activity/exercise needs.;Develop an individualized exercise prescription for aerobic and resistive training based on initial evaluation findings, risk stratification, comorbidities and participant's personal goals.       Expected Outcomes Short  Term: Attend rehab on a regular basis to increase amount of physical activity.;Long Term: Add in home exercise to make exercise part of routine and to increase amount of physical activity.;Long Term: Exercising regularly at least 3-5 days a week.       Increase Strength and Stamina Yes       Intervention Provide advice, education, support and counseling about physical activity/exercise needs.;Develop an individualized exercise prescription for aerobic and resistive training based on initial evaluation findings, risk stratification, comorbidities and participant's personal goals.       Expected Outcomes Short Term: Increase workloads from initial exercise prescription for resistance, speed, and METs.;Short Term: Perform resistance training exercises routinely during rehab and add in resistance training at home;Long Term: Improve cardiorespiratory fitness, muscular endurance and strength as measured by increased METs and functional capacity ( )       Able to understand and use rate of perceived exertion (RPE) scale Yes       Intervention Provide education and explanation on how to use RPE scale       Expected Outcomes Short Term: Able to use RPE daily in rehab to express subjective intensity level;Long Term:  Able to use RPE to guide intensity level when exercising independently       Knowledge and understanding of Target Heart Rate Range (THRR) Yes       Intervention Provide education and explanation of THRR including how the numbers were predicted and where they are located for reference       Expected Outcomes Short Term: Able to state/look up THRR;Short Term: Able to use daily as guideline for intensity in rehab;Long Term: Able to use THRR to govern intensity when exercising independently       Understanding of Exercise Prescription Yes       Intervention Provide education, explanation, and written materials on patient's individual exercise prescription       Expected Outcomes Short Term: Able to  explain program exercise prescription;Long Term: Able to explain home exercise prescription to exercise independently                Exercise Goals Re-Evaluation:  Exercise Goals Re-Evaluation     Row Name 02/21/23 0836 03/16/23 0841 04/08/23 0835 04/29/23 0838       Exercise Goal Re-Evaluation   Exercise Goals Review Increase Physical Activity;Understanding of Exercise Prescription;Increase Strength and Stamina;Knowledge and understanding of Target Heart Rate Range (THRR);Able to understand and use rate of perceived exertion (RPE) scale Increase Physical Activity;Understanding of Exercise Prescription;Increase Strength and Stamina;Knowledge and understanding of Target Heart Rate Range (THRR);Able to understand and use rate of perceived exertion (RPE) scale Increase Physical Activity;Understanding of Exercise Prescription;Increase Strength and Stamina;Knowledge and understanding of Target Heart Rate Range (THRR);Able to understand and use rate of perceived exertion (RPE) scale Increase Physical Activity;Understanding of Exercise Prescription;Increase Strength and Stamina;Knowledge and understanding of Target Heart Rate Range (THRR);Able to understand and use rate of perceived exertion (RPE) scale    Comments Pt first day in the [facility] program. Pt tolerated exercise well with an average MET level of 3.47. Pt is learning his THRR, RPE and ExRx. Off to a good start Reviewed MET's, goals and home ExRx. Pt tolerated exercise well with an average MET level of 3.96.  Pt will adding in walking for exercise 1-2 days for 30-45 mins per session. Pt had been feeling better overall since surgery and is increase his stamina over time. Reviewed goals with pt today. Pt original goals were to lose weight and build strength/stamina. Pt has been travelling lately with his son and for work which has caused him to fall out of his routine and gain a few pounds back. He feels he has the resources and knowledge to know  what to do, but finding ways to do it with travel is difficult. We discussed how to fit exercise into his changing routine and travel. He feels he is stronger when he is exercising here in the program. Pt graduated the [employer] today with an average MET level of 3.46. Pt Will continue to exercise on his own by walking, going to the [facility] and swimming 6-7 days for 30-45 mins per session. Pt feels good with the progress he's made and enjoyed his time in [facility]    Expected Outcomes Will continue to monitor pt and progress workloads as tolerated without sign or symptom Will continue to monitor pt and progress workloads as tolerated without sign or symptom Will continue to monitor and progress workloads as tolerated without s/sx. Pt will continue to exercise on his own and gain strength             Nutrition & Weight - Outcomes:  Pre Biometrics - 02/17/23 0825       Pre Biometrics   Waist Circumference 48.5 inches    Hip Circumference 45 inches    Waist to Hip Ratio 1.08 %    Triceps Skinfold 25 mm    % Body Fat 35.9 %    Grip Strength 38 kg    Flexibility 11.25 in    Single Leg Stand 30 seconds             Post Biometrics - 04/25/23 0835        Post  Biometrics   Height 5' 8.75" (1.746 m)    Weight 111 kg    Waist Circumference 48 inches    Hip Circumference 45 inches    Waist to Hip Ratio 1.07 %    BMI (Calculated) 36.41    Triceps Skinfold 30 mm    % Body Fat 36.6 %    Grip Strength 45 kg    Flexibility 13 in    Single Leg Stand 30 seconds             Nutrition:  Nutrition Therapy & Goals - 04/15/23 0958       Nutrition Therapy   Diet Heart Healthy/Carbohydrate Consistent Diet    Drug/Food Interactions Statins/Certain Fruits;Coumadin/Vit K      Personal Nutrition Goals   Nutrition Goal Patient to identify strategies for reducing cardiovascular risk by attending the [facility] education and nutrition series weekly.    Personal Goal #2 Patient to improve  diet quality by using the plate method as a guide for meal planning to include lean protein/plant protein, fruits, vegetables, whole grains, nonfat dairy as part of a well-balanced diet.    Personal Goal #3 Patient to identify food sources and limit daily intake of saturated fat, trans fat, sodium, and refined carbohydrates.    Comments Goals in action. Hill continues to attend the [employer] and nutrition series regularly. He has started making many changes including introduced many [facility] recipes, reduced sodium intake, increased high fiber foods. He does continue to eat out often  related to his son's travel baseball schedule; gave resources and disussed healthier choices when out to eat. Yechezkel has medical history of elevated triglycerides, WNL at this time and continues fenofibrate. A1c improved to 5.9. He is up 1.5# since starting with our program. Patient will continue to benefit from participation in intensive cardiac rehab for nutrition, exercise, and lifestyle modfication.      Intervention Plan   Intervention Prescribe, educate and counsel regarding individualized specific dietary modifications aiming towards targeted core components such as weight, hypertension, lipid management, diabetes, heart failure and other comorbidities.;Nutrition handout(s) given to patient.    Expected Outcomes Short Term Goal: Understand basic principles of dietary content, such as calories, fat, sodium, cholesterol and nutrients.;Long Term Goal: Adherence to prescribed nutrition plan.             Nutrition Discharge:  Nutrition Assessments - 05/02/23 0849       Rate Your Plate Scores   Post Score 77             Education Questionnaire Score:  Knowledge Questionnaire Score - 04/29/23 1648       Knowledge Questionnaire Score   Post Score 26.21             Goals reviewed with patient; copy given to patient. Achilles graduated from cardiac rehab program 04/29/23 with  completion of 39 exercise and education sessions. Pt maintained good attendance and progressed nicely during his participation in rehab as evidenced by increased MET level.  Medication list reconciled. Repeat PHQ9  score=1.  Gadiel has made lifestyle changes and should be commended for his progress and goals achieved during cardiac rehab.  He plans to continue a home exercise plan.

## 2023-05-20 ENCOUNTER — Ambulatory Visit: Payer: BC Managed Care – PPO | Attending: Internal Medicine | Admitting: *Deleted

## 2023-05-20 DIAGNOSIS — Z7901 Long term (current) use of anticoagulants: Secondary | ICD-10-CM

## 2023-05-20 DIAGNOSIS — Z952 Presence of prosthetic heart valve: Secondary | ICD-10-CM | POA: Diagnosis not present

## 2023-05-20 DIAGNOSIS — I3139 Other pericardial effusion (noninflammatory): Secondary | ICD-10-CM

## 2023-05-20 LAB — POCT INR: INR: 2.4 (ref 2.0–3.0)

## 2023-05-20 NOTE — Patient Instructions (Signed)
Description   Continue taking warfarin 1 tablet daily except for 1.5 tablets on Sunday, Tuesday and Thursday. Stay consistent with greens each week.  Recheck INR in 5 weeks.   Anticoagulation Clinic [phone]

## 2023-05-24 ENCOUNTER — Other Ambulatory Visit: Payer: Self-pay | Admitting: *Deleted

## 2023-05-24 ENCOUNTER — Telehealth: Payer: Self-pay | Admitting: Internal Medicine

## 2023-05-24 DIAGNOSIS — Z7901 Long term (current) use of anticoagulants: Secondary | ICD-10-CM

## 2023-05-24 DIAGNOSIS — Z952 Presence of prosthetic heart valve: Secondary | ICD-10-CM

## 2023-05-24 MED ORDER — WARFARIN SODIUM 2.5 MG PO TABS
ORAL_TABLET | ORAL | 3 refills | Status: DC
Start: 2023-05-24 — End: 2024-01-02

## 2023-05-24 NOTE — Telephone Encounter (Signed)
Warfarin 2.5mg  refill Acute venous embolism and thrombosis of deep vessels of proximal,  lower extremity, S/P AVR (aortic valve replacement)  Last INR 05/20/23 Last OV 04/28/23

## 2023-05-24 NOTE — Telephone Encounter (Signed)
 *  STAT* If patient is at the pharmacy, call can be transferred to refill team.   1. Which medications need to be refilled? (please list name of each medication and dose if known)   warfarin (COUMADIN) 2.5 MG tablet    2. Which pharmacy/location (including street and city if local pharmacy) is medication to be sent to?  [id number] - [place], [place] - [address] AT [address]    3. Do they need a 30 day or 90 day supply? 30 days  Pt is out of medications

## 2023-06-16 ENCOUNTER — Encounter: Payer: Self-pay | Admitting: Internal Medicine

## 2023-06-21 ENCOUNTER — Ambulatory Visit: Payer: BC Managed Care – PPO | Attending: Cardiology

## 2023-06-21 DIAGNOSIS — Z7901 Long term (current) use of anticoagulants: Secondary | ICD-10-CM

## 2023-06-21 DIAGNOSIS — Z952 Presence of prosthetic heart valve: Secondary | ICD-10-CM

## 2023-06-21 DIAGNOSIS — I3139 Other pericardial effusion (noninflammatory): Secondary | ICD-10-CM | POA: Diagnosis not present

## 2023-06-21 LAB — POCT INR: INR: 1.5 — AB (ref 2.0–3.0)

## 2023-06-21 NOTE — Patient Instructions (Signed)
TAKE 2.5 TABLETS TODAY ONLY  THEN Continue taking warfarin 1 tablet daily except for 1.5 tablets on Sunday, Tuesday and Thursday. Stay consistent with greens each week.  Recheck INR in 3 weeks.   Anticoagulation Clinic [phone]

## 2023-06-23 ENCOUNTER — Telehealth: Payer: Self-pay | Admitting: *Deleted

## 2023-06-23 NOTE — Telephone Encounter (Signed)
   Pre-operative Risk Assessment    Patient Name: Douglas Phillips  DOB: 01/11/1976 MRN: [id number]      Request for Surgical Clearance    Procedure:   VASECTOMY  Date of Surgery:  Clearance TBD                                 Surgeon:  DR. Modena Slater Surgeon's Group or Practice Name:  [facility] Phone number:  [phone] Fax number:  [phone]   Type of Clearance Requested:   - Medical  - Pharmacy:  Hold Warfarin (Coumadin) x 5 DAYS PRIOR   Type of Anesthesia:  Not Indicated TRIED TO CALL DR. BELL'S OFFICE TO CONFIRM TYPE OF ANESTHESIA TO BE USED    Additional requests/questions:    Douglas Phillips   06/23/2023, 9:12 AM

## 2023-06-24 NOTE — Telephone Encounter (Signed)
Patient with diagnosis of mechanical On-X aortic valve on warfarin for anticoagulation.    Procedure: vasectomy Date of procedure: TBD   CrCl 164 Platelet count 348  Per office protocol, patient can hold warfarin for 5 days prior to procedure.   Patient will not need bridging with Lovenox (enoxaparin) around procedure.  **This guidance is not considered finalized until pre-operative APP has relayed final recommendations.**

## 2023-06-28 NOTE — Telephone Encounter (Signed)
     Primary Cardiologist: Maisie Fus, MD  Chart reviewed as part of pre-operative protocol coverage. Given past medical history and time since last visit, based on ACC/AHA guidelines, Douglas Phillips would be at acceptable risk for the planned procedure without further cardiovascular testing.   Patient with diagnosis of mechanical On-X aortic valve on warfarin for anticoagulation.     Procedure: vasectomy Date of procedure: TBD     CrCl 164 Platelet count 348   Per office protocol, patient can hold warfarin for 5 days prior to procedure.   Patient will not need bridging with Lovenox (enoxaparin) around procedure.  I will route this recommendation to the requesting party via Epic fax function and remove from pre-op pool.  Please call with questions.  Thomasene Ripple. Keiona Jenison NP-C     06/28/2023, 8:59 AM [facility] [address] Office [phone] Fax [phone]

## 2023-07-01 ENCOUNTER — Encounter: Payer: Self-pay | Admitting: Pharmacist

## 2023-07-01 NOTE — Telephone Encounter (Signed)
This encounter was created in error - please disregard.

## 2023-07-13 ENCOUNTER — Ambulatory Visit: Payer: BC Managed Care – PPO | Attending: Internal Medicine | Admitting: *Deleted

## 2023-07-13 DIAGNOSIS — I3139 Other pericardial effusion (noninflammatory): Secondary | ICD-10-CM

## 2023-07-13 DIAGNOSIS — Z7901 Long term (current) use of anticoagulants: Secondary | ICD-10-CM

## 2023-07-13 DIAGNOSIS — Z952 Presence of prosthetic heart valve: Secondary | ICD-10-CM

## 2023-07-13 LAB — POCT INR: INR: 1.3 — AB (ref 2.0–3.0)

## 2023-07-13 NOTE — Patient Instructions (Addendum)
Description   TAKE 2 TABLETS TODAY AND 2 TABLETS  TOMORROW THEN START taking warfarin 1.5 tablets daily except for 1 tablet on Monday and Friday. Stay consistent with greens each week.  Recheck INR in 1 week.   Anticoagulation Clinic [phone]

## 2023-07-22 ENCOUNTER — Ambulatory Visit: Payer: BC Managed Care – PPO | Attending: Cardiology

## 2023-07-22 DIAGNOSIS — Z7901 Long term (current) use of anticoagulants: Secondary | ICD-10-CM | POA: Diagnosis not present

## 2023-07-22 DIAGNOSIS — Z952 Presence of prosthetic heart valve: Secondary | ICD-10-CM | POA: Diagnosis not present

## 2023-07-22 DIAGNOSIS — I3139 Other pericardial effusion (noninflammatory): Secondary | ICD-10-CM | POA: Diagnosis not present

## 2023-07-22 LAB — POCT INR: INR: 2.1 (ref 2.0–3.0)

## 2023-07-22 NOTE — Patient Instructions (Signed)
Continue taking warfarin 1.5 tablets daily except for 1 tablet on Monday and Friday. Stay consistent with greens each week. HOLD 8/24-8/28 Vasectomy 8/29 Recheck INR in 4 weeks.   Anticoagulation Clinic [phone]

## 2023-08-08 ENCOUNTER — Encounter: Payer: Self-pay | Admitting: Internal Medicine

## 2023-08-18 ENCOUNTER — Ambulatory Visit: Payer: BC Managed Care – PPO | Attending: Internal Medicine | Admitting: *Deleted

## 2023-08-18 DIAGNOSIS — Z7901 Long term (current) use of anticoagulants: Secondary | ICD-10-CM

## 2023-08-18 DIAGNOSIS — I3139 Other pericardial effusion (noninflammatory): Secondary | ICD-10-CM

## 2023-08-18 DIAGNOSIS — Z952 Presence of prosthetic heart valve: Secondary | ICD-10-CM | POA: Diagnosis not present

## 2023-08-18 LAB — POCT INR: INR: 2.1 (ref 2.0–3.0)

## 2023-08-18 NOTE — Patient Instructions (Signed)
Description   Continue taking warfarin 1.5 tablets daily except for 1 tablet on Monday and Friday. Stay consistent with greens each week. Rescheduled Vasectomy 09/22/23 Recheck INR in 4 weeks. Anticoagulation Clinic [phone]

## 2023-09-15 ENCOUNTER — Ambulatory Visit: Payer: BC Managed Care – PPO | Attending: Internal Medicine | Admitting: *Deleted

## 2023-09-15 DIAGNOSIS — I3139 Other pericardial effusion (noninflammatory): Secondary | ICD-10-CM

## 2023-09-15 DIAGNOSIS — Z7901 Long term (current) use of anticoagulants: Secondary | ICD-10-CM

## 2023-09-15 DIAGNOSIS — Z952 Presence of prosthetic heart valve: Secondary | ICD-10-CM | POA: Diagnosis not present

## 2023-09-15 LAB — POCT INR: INR: 1.6 — AB (ref 2.0–3.0)

## 2023-09-15 NOTE — Patient Instructions (Addendum)
Description   Take 2 tablets of warfarin today then continue taking warfarin 1.5 tablets daily except for 1 tablet on Monday and Friday. Stay consistent with greens each week. Rescheduled Vasectomy 09/22/23 Recheck INR in 1 week post warfarin. Anticoagulation Clinic [phone]      Last dose dose 09/16/23 and no warfarin 09/17/23-09/21/23, resume after procedure per surgeon at normal dose.

## 2023-09-29 ENCOUNTER — Ambulatory Visit: Payer: BC Managed Care – PPO

## 2023-09-30 ENCOUNTER — Ambulatory Visit: Payer: BC Managed Care – PPO | Attending: Internal Medicine

## 2023-09-30 DIAGNOSIS — Z952 Presence of prosthetic heart valve: Secondary | ICD-10-CM | POA: Diagnosis not present

## 2023-09-30 DIAGNOSIS — I3139 Other pericardial effusion (noninflammatory): Secondary | ICD-10-CM

## 2023-09-30 DIAGNOSIS — Z7901 Long term (current) use of anticoagulants: Secondary | ICD-10-CM | POA: Diagnosis not present

## 2023-09-30 LAB — POCT INR: INR: 1.4 — AB (ref 2.0–3.0)

## 2023-09-30 NOTE — Patient Instructions (Signed)
Take 2 tablets of warfarin today then continue taking warfarin 1.5 tablets daily except for 1 tablet on Monday and Friday. Stay consistent with greens each week. INR in 2 weeks.  Anticoagulation Clinic [phone]

## 2023-10-12 ENCOUNTER — Ambulatory Visit: Payer: BC Managed Care – PPO | Attending: Internal Medicine

## 2023-10-19 ENCOUNTER — Ambulatory Visit: Payer: BC Managed Care – PPO | Attending: Internal Medicine | Admitting: *Deleted

## 2023-10-19 DIAGNOSIS — Z952 Presence of prosthetic heart valve: Secondary | ICD-10-CM

## 2023-10-19 DIAGNOSIS — Z7901 Long term (current) use of anticoagulants: Secondary | ICD-10-CM

## 2023-10-19 DIAGNOSIS — I3139 Other pericardial effusion (noninflammatory): Secondary | ICD-10-CM

## 2023-10-19 LAB — POCT INR: INR: 1.8 — AB (ref 2.0–3.0)

## 2023-10-19 NOTE — Patient Instructions (Addendum)
Description   Take 2 tablets of warfarin today then START taking warfarin 1.5 tablets daily except for 1 tablet on Monday. Stay consistent with greens each week. INR in 3 weeks.  Anticoagulation Clinic [phone]

## 2023-11-09 ENCOUNTER — Ambulatory Visit: Payer: BC Managed Care – PPO | Attending: Cardiology

## 2023-11-09 DIAGNOSIS — Z952 Presence of prosthetic heart valve: Secondary | ICD-10-CM | POA: Diagnosis not present

## 2023-11-09 DIAGNOSIS — I3139 Other pericardial effusion (noninflammatory): Secondary | ICD-10-CM

## 2023-11-09 LAB — POCT INR: INR: 2.2 (ref 2.0–3.0)

## 2023-11-09 NOTE — Patient Instructions (Signed)
Description   Continue taking warfarin 1.5 tablets daily except for 1 tablet on Monday.  Stay consistent with greens each week. INR in 4 weeks.   Anticoagulation Clinic [phone]

## 2023-12-16 ENCOUNTER — Ambulatory Visit: Payer: BC Managed Care – PPO | Attending: Cardiovascular Disease

## 2023-12-16 DIAGNOSIS — Z952 Presence of prosthetic heart valve: Secondary | ICD-10-CM | POA: Diagnosis not present

## 2023-12-16 DIAGNOSIS — I3139 Other pericardial effusion (noninflammatory): Secondary | ICD-10-CM

## 2023-12-16 DIAGNOSIS — Z7901 Long term (current) use of anticoagulants: Secondary | ICD-10-CM

## 2023-12-16 LAB — POCT INR: INR: 1.9 — AB (ref 2.0–3.0)

## 2023-12-16 NOTE — Patient Instructions (Signed)
*  DOES NOT WANT AVS* Take 2 tablets today only then Continue taking warfarin 1.5 tablets daily except for 1 tablet on Monday.  Stay consistent with greens each week. INR in 4 weeks.   Anticoagulation Clinic [phone]

## 2024-01-01 ENCOUNTER — Other Ambulatory Visit: Payer: Self-pay | Admitting: Internal Medicine

## 2024-01-01 DIAGNOSIS — Z952 Presence of prosthetic heart valve: Secondary | ICD-10-CM

## 2024-01-01 DIAGNOSIS — Z7901 Long term (current) use of anticoagulants: Secondary | ICD-10-CM

## 2024-01-13 ENCOUNTER — Ambulatory Visit: Payer: BC Managed Care – PPO | Attending: Internal Medicine | Admitting: *Deleted

## 2024-01-13 DIAGNOSIS — Z7901 Long term (current) use of anticoagulants: Secondary | ICD-10-CM

## 2024-01-13 DIAGNOSIS — Z952 Presence of prosthetic heart valve: Secondary | ICD-10-CM

## 2024-01-13 DIAGNOSIS — I3139 Other pericardial effusion (noninflammatory): Secondary | ICD-10-CM | POA: Diagnosis not present

## 2024-01-13 LAB — POCT INR: INR: 2.2 (ref 2.0–3.0)

## 2024-01-13 NOTE — Patient Instructions (Signed)
Description   *DOES NOT WANT AVS* Continue taking warfarin 1.5 tablets daily except for 1 tablet on Monday.  Stay consistent with greens each week. INR in 5 weeks.   Anticoagulation Clinic [phone]

## 2024-01-19 ENCOUNTER — Other Ambulatory Visit: Payer: Self-pay | Admitting: Surgery

## 2024-01-19 DIAGNOSIS — Z952 Presence of prosthetic heart valve: Secondary | ICD-10-CM

## 2024-02-17 ENCOUNTER — Ambulatory Visit: Payer: Self-pay

## 2024-02-27 ENCOUNTER — Other Ambulatory Visit: Payer: BC Managed Care – PPO

## 2024-03-07 ENCOUNTER — Ambulatory Visit: Payer: BC Managed Care – PPO | Admitting: Surgery

## 2024-03-15 ENCOUNTER — Ambulatory Visit
Admission: RE | Admit: 2024-03-15 | Discharge: 2024-03-15 | Disposition: A | Discharge status: Home / Self Care | Payer: BC Managed Care – PPO | Source: Ambulatory Visit | Attending: Surgery | Admitting: Surgery

## 2024-03-15 DIAGNOSIS — Z952 Presence of prosthetic heart valve: Secondary | ICD-10-CM

## 2024-03-15 MED ORDER — IOPAMIDOL (ISOVUE-370) INJECTION 76%
95.0000 mL | Freq: Once | INTRAVENOUS | Status: AC | PRN
  Administered 2024-03-15: 95 mL via INTRAVENOUS

## 2024-03-16 ENCOUNTER — Ambulatory Visit: Payer: Self-pay | Attending: Internal Medicine

## 2024-03-16 DIAGNOSIS — I3139 Other pericardial effusion (noninflammatory): Secondary | ICD-10-CM

## 2024-03-16 DIAGNOSIS — Z952 Presence of prosthetic heart valve: Secondary | ICD-10-CM

## 2024-03-16 LAB — POCT INR: INR: 3 (ref 2.0–3.0)

## 2024-03-16 NOTE — Patient Instructions (Signed)
 Description   *DOES NOT WANT AVS* Continue taking warfarin 1.5 tablets daily except for 1 tablet on Monday.  Stay consistent with greens each week. Recheck INR in 6 weeks.   Anticoagulation Clinic [phone]

## 2024-03-28 ENCOUNTER — Ambulatory Visit: Payer: BC Managed Care – PPO | Admitting: Surgery

## 2024-03-28 ENCOUNTER — Ambulatory Visit: Admitting: Physician Assistant

## 2024-03-28 VITALS — BP 159/82 | HR 55 | Resp 18 | Ht 67.0 in | Wt 248.0 lb

## 2024-03-28 DIAGNOSIS — Z09 Encounter for follow-up examination after completed treatment for conditions other than malignant neoplasm: Secondary | ICD-10-CM | POA: Diagnosis not present

## 2024-03-28 DIAGNOSIS — I7121 Aneurysm of the ascending aorta, without rupture: Secondary | ICD-10-CM | POA: Diagnosis not present

## 2024-03-28 NOTE — Progress Notes (Addendum)
 HPI: Mr. Ona Rathert is a 48 year old gentleman with a past history of type 2 diabetes mellitus, dyslipidemia, coronary artery disease, obstructive sleep apnea, and bicuspid aortic valve with thoracic aortic aneurysm measuring 5.2 cm.  He is status post replacement of the ascending aorta and Bentall procedure with a 27/29 mechanical On-X valve by Dr. Sherene Dilling on 12/27/2022.  His postoperative course was notable for development of a moderate to large pericardial effusion following his discharge from the hospital requiring readmission for subxiphoid drainage.  He progressed expectedly following that procedure.  He has been anticoagulated with Coumadin and is followed by the Coumadin clinic regularly for management of dosing.  He returns today for annual follow-up and surveillance of his aortic anatomy.   Mr. Amos denies any new problems since his last visit.  He continues to have some soreness with moving in certain directions these episodes are brief.  He has had no dizziness or syncope.  Current Outpatient Medications  Medication Sig Dispense Refill   acetaminophen (TYLENOL) 500 MG tablet Take 2 tablets (1,000 mg total) by mouth every 6 (six) hours as needed for mild pain or fever. 30 tablet 0   amoxicillin (AMOXIL) 500 MG capsule Take 500 mg by mouth as needed (before Dentist).     atorvastatin (LIPITOR) 40 MG tablet Take 1 tablet (40 mg total) by mouth daily. (Patient taking differently: Take 40 mg by mouth at bedtime.) 90 tablet 3   blood glucose meter kit and supplies Dispense based on patient and insurance preference. Use one time daily. ICD10: E11.65 1 each 0   fenofibrate 160 MG tablet Take 160 mg by mouth daily.     glipiZIDE (GLUCOTROL) 5 MG tablet Take 5 mg by mouth at bedtime.     glucose blood (ONETOUCH VERIO) test strip 1 each by Other route in the morning and at bedtime. Use as instructed 100 each 1   lamoTRIgine (LAMICTAL) 100 MG tablet Take 1 tablet (100 mg total) by mouth 2 (two)  times daily. (Patient not taking: Reported on 04/28/2023) 60 tablet 5   lamoTRIgine (LAMICTAL) 25 MG tablet Take 1 pill at bedtime for 2 weeks. Then take 1 pill twice a day for 2 weeks. Then take 1 pill in AM and 2 pills in PM for one week. Then take 2 pills twice a day for one week. Then take 2 pills in AM and 3 pills in PM for one week. Then take 3 pills twice a day for one week. Then take 3 pills in AM and 4 pills in PM for one week. Then take 4 pills twice a day until pills are gone. Then start 100 mg tablets. (Patient not taking: Reported on 04/28/2023) 273 tablet 0   metoprolol tartrate (LOPRESSOR) 25 MG tablet Take 0.5 tablets (12.5 mg total) by mouth 2 (two) times daily. 30 tablet 3   NOVOLIN 70/30 KWIKPEN (70-30) 100 UNIT/ML KwikPen Inject 18 Units into the skin in the morning and at bedtime.     oxyCODONE (OXY IR/ROXICODONE) 5 MG immediate release tablet Take 1 tablet (5 mg total) by mouth every 4 (four) hours as needed for severe pain. (Patient not taking: Reported on 04/28/2023) 30 tablet 0   pioglitazone (ACTOS) 15 MG tablet Take 15 mg by mouth in the morning.     promethazine (PHENERGAN) 12.5 MG tablet Take 1 tablet (12.5 mg total) by mouth every 6 (six) hours as needed for nausea or vomiting. (Patient not taking: Reported on 04/28/2023) 15 tablet 0  VITAMIN D PO Take 2,000 Units by mouth daily.     warfarin (COUMADIN) 2.5 MG tablet TAKE 1 TABLET BY MOUTH DAILY. EXCEPT FOR 1.5 TABLETS ON SUNDAY, TUESDAY AND THURSDAY OR AS DIRECTED BY COAGULATION CLINIC 45 tablet 3   ZENPEP 40000-126000 units CPEP Take 2 capsules by mouth with breakfast, with lunch, and with evening meal.     No current facility-administered medications for this visit.    Physical Exam: Vital signs BP 159/82 Heart rate 55 Respirations 18 SpO2 98% on room air  General: Pleasant 48 year old male in no distress. Heart: Bradycardia heart rate 55.  Crisp click of the prosthetic aortic valve. Chest: Breath sounds are full  and clear to auscultation.  The sternotomy incision is well-healed and the sternum is stable. Extremities: There is no peripheral edema.  Diagnostic Tests: CLINICAL DATA:  Aortic atherosclerosis, aortic surveillance   EXAM: CT ANGIOGRAPHY CHEST WITH CONTRAST   TECHNIQUE: Multidetector CT imaging of the chest was performed using the standard protocol during bolus administration of intravenous contrast. Multiplanar CT image reconstructions and MIPs were obtained to evaluate the vascular anatomy.   RADIATION DOSE REDUCTION: This exam was performed according to the departmental dose-optimization program which includes automated exposure control, adjustment of the mA and/or kV according to patient size and/or use of iterative reconstruction technique.   CONTRAST:  95mL ISOVUE-370 IOPAMIDOL (ISOVUE-370) INJECTION 76%   COMPARISON:  November 22, 2022, September 24, 2021   FINDINGS: Pulmonary Embolism: No pulmonary embolism.   Cardiovascular: Mild cardiomegaly. No pericardial effusion. Aortic valve replacement with an ascending tube graft in place, which is widely patent. No perigraft fluid collection or inflammation. Normal variant 2 vessel aortic arch anatomy. The great vessels are otherwise widely patent. No aortic dissection or aneurysm.   Mediastinum/Nodes: No mediastinal mass. No mediastinal, hilar, or axillary lymphadenopathy.   Lungs/Pleura: The midline trachea and bronchi are patent. No focal airspace consolidation, pleural effusion, or pneumothorax.   Musculoskeletal: No acute fracture or destructive bone lesion. Sternotomy wires. Multilevel thoracic osteophytosis.   Upper Abdomen: No acute abnormality in the partially visualized upper abdomen.   Review of the MIP images confirms the above findings.   IMPRESSION: 1. Postsurgical changes of an interval ascending aortic tube graft and aortic valve replacement. No periprocedural complications visualized. No aortic  dissection or aneurysm in the remaining thoracic aorta. 2. No pneumonia, pulmonary edema, or pleural effusion.     Electronically Signed   By: Wallie Char M.D.   On: 03/15/2024 10:38    Impression / Plan: -Stable post-surgical changes in the ascending aorta following repair of thoracic aortic aneurysm and Bentall procedure in January 2024 with no evidence of dissection or aneurysm in the remaining thoracic aorta.  1 year follow-up with CTA chest recommended.    -Hypertension: Blood pressure was 159/82 in the office today.  He told me this was very unusual that his systolic blood pressure normally runs in the range of 110-120.  He continues to take the metoprolol 12.5 mg twice daily.  We had a long discussion about the importance of careful blood pressure management in regards to his vascular disease.  I asked him to keep a close watch on his blood pressure at home and document the results to be shared with his primary care provider.  I asked him to let his PCP or [place] know if his blood pressure is consistently staying above 130/90.  Leary Roca, PA-C [facility] [phone]

## 2024-03-28 NOTE — Patient Instructions (Signed)
 Keep a close watch on your blood pressure with a goal of less than 130/90.  Blood pressure consistently stays above 130 systolic, would increase your metoprolol to 25 mg twice daily.  Follow-up in 1 year with CTA chest.  Avoid strenuous activity with no lifting greater than 30 pounds.

## 2024-04-23 ENCOUNTER — Other Ambulatory Visit: Payer: Self-pay | Admitting: Internal Medicine

## 2024-04-23 DIAGNOSIS — Z7901 Long term (current) use of anticoagulants: Secondary | ICD-10-CM

## 2024-04-23 DIAGNOSIS — Z952 Presence of prosthetic heart valve: Secondary | ICD-10-CM

## 2024-04-27 ENCOUNTER — Ambulatory Visit

## 2024-05-17 ENCOUNTER — Ambulatory Visit: Attending: Internal Medicine

## 2024-05-17 DIAGNOSIS — Z952 Presence of prosthetic heart valve: Secondary | ICD-10-CM

## 2024-05-17 DIAGNOSIS — I3139 Other pericardial effusion (noninflammatory): Secondary | ICD-10-CM | POA: Diagnosis not present

## 2024-05-17 LAB — POCT INR: INR: 2.2 (ref 2.0–3.0)

## 2024-05-17 NOTE — Patient Instructions (Signed)
 Description   *DOES NOT WANT AVS* Continue taking warfarin 1.5 tablets daily except for 1 tablet on Monday.  Stay consistent with greens each week. Recheck INR in 7 weeks.   Anticoagulation Clinic [phone]

## 2024-07-05 ENCOUNTER — Ambulatory Visit

## 2024-07-13 ENCOUNTER — Ambulatory Visit: Attending: Cardiology

## 2024-07-13 DIAGNOSIS — I3139 Other pericardial effusion (noninflammatory): Secondary | ICD-10-CM

## 2024-07-13 DIAGNOSIS — Z7901 Long term (current) use of anticoagulants: Secondary | ICD-10-CM | POA: Diagnosis not present

## 2024-07-13 DIAGNOSIS — Z952 Presence of prosthetic heart valve: Secondary | ICD-10-CM

## 2024-07-13 LAB — POCT INR: INR: 2.8 (ref 2.0–3.0)

## 2024-07-13 NOTE — Patient Instructions (Signed)
*  DOES NOT WANT AVS* Continue taking warfarin 1.5 tablets daily except for 1 tablet on Monday.  Stay consistent with greens each week. Recheck INR in 8 weeks.   Anticoagulation Clinic [phone]

## 2024-07-13 NOTE — Progress Notes (Signed)
 INR-2.8 Please see anticoagulation encounter

## 2024-09-07 ENCOUNTER — Ambulatory Visit: Attending: Cardiovascular Disease | Admitting: *Deleted

## 2024-09-07 ENCOUNTER — Other Ambulatory Visit: Payer: Self-pay | Admitting: Surgery

## 2024-09-07 DIAGNOSIS — Z7901 Long term (current) use of anticoagulants: Secondary | ICD-10-CM | POA: Diagnosis not present

## 2024-09-07 DIAGNOSIS — Z952 Presence of prosthetic heart valve: Secondary | ICD-10-CM

## 2024-09-07 DIAGNOSIS — I3139 Other pericardial effusion (noninflammatory): Secondary | ICD-10-CM | POA: Diagnosis not present

## 2024-09-07 LAB — POCT INR: INR: 3.5 — AB (ref 2.0–3.0)

## 2024-09-07 NOTE — Patient Instructions (Signed)
 Description   *DOES NOT WANT AVS* INR-3.5; Do not have any warfarin today then continue taking warfarin 1.5 tablets daily except for 1 tablet on Monday.  Stay consistent with greens each week. Recheck INR in 6 weeks (normally 8 weeks).   Anticoagulation Clinic [phone]

## 2024-09-07 NOTE — Progress Notes (Signed)
 Description   *DOES NOT WANT AVS* INR-3.5; Do not have any warfarin today then continue taking warfarin 1.5 tablets daily except for 1 tablet on Monday.  Stay consistent with greens each week. Recheck INR in 6 weeks (normally 8 weeks).   Anticoagulation Clinic [phone]

## 2024-09-11 ENCOUNTER — Other Ambulatory Visit: Payer: Self-pay | Admitting: Surgery

## 2024-09-11 DIAGNOSIS — Z7901 Long term (current) use of anticoagulants: Secondary | ICD-10-CM

## 2024-09-11 DIAGNOSIS — Z952 Presence of prosthetic heart valve: Secondary | ICD-10-CM

## 2024-09-12 ENCOUNTER — Other Ambulatory Visit: Payer: Self-pay | Admitting: Surgery

## 2024-09-12 DIAGNOSIS — Z7901 Long term (current) use of anticoagulants: Secondary | ICD-10-CM

## 2024-09-12 DIAGNOSIS — Z952 Presence of prosthetic heart valve: Secondary | ICD-10-CM

## 2024-10-19 ENCOUNTER — Ambulatory Visit: Attending: Cardiology | Admitting: *Deleted

## 2024-10-19 DIAGNOSIS — Z952 Presence of prosthetic heart valve: Secondary | ICD-10-CM

## 2024-10-19 DIAGNOSIS — I3139 Other pericardial effusion (noninflammatory): Secondary | ICD-10-CM | POA: Diagnosis not present

## 2024-10-19 DIAGNOSIS — Z7901 Long term (current) use of anticoagulants: Secondary | ICD-10-CM

## 2024-10-19 LAB — POCT INR: INR: 2.8 (ref 2.0–3.0)

## 2024-10-19 NOTE — Progress Notes (Signed)
 Description   *DOES NOT WANT AVS* INR-2.8; Continue taking warfarin 1.5 tablets daily except for 1 tablet on Monday.  Stay consistent with greens each week. Recheck INR in 8 weeks (normally 8 weeks).   Anticoagulation Clinic [phone]

## 2024-10-19 NOTE — Patient Instructions (Signed)
 Description   *DOES NOT WANT AVS* INR-2.8; Continue taking warfarin 1.5 tablets daily except for 1 tablet on Monday.  Stay consistent with greens each week. Recheck INR in 8 weeks (normally 8 weeks).   Anticoagulation Clinic [phone]

## 2024-10-30 ENCOUNTER — Encounter: Payer: Self-pay | Admitting: Cardiology

## 2024-10-30 ENCOUNTER — Ambulatory Visit: Attending: Cardiology | Admitting: Cardiology

## 2024-10-30 VITALS — BP 122/68 | HR 67 | Ht 67.0 in | Wt 240.0 lb

## 2024-10-30 DIAGNOSIS — I2510 Atherosclerotic heart disease of native coronary artery without angina pectoris: Secondary | ICD-10-CM

## 2024-10-30 DIAGNOSIS — Z7901 Long term (current) use of anticoagulants: Secondary | ICD-10-CM

## 2024-10-30 DIAGNOSIS — Z952 Presence of prosthetic heart valve: Secondary | ICD-10-CM

## 2024-10-30 DIAGNOSIS — E1165 Type 2 diabetes mellitus with hyperglycemia: Secondary | ICD-10-CM | POA: Diagnosis not present

## 2024-10-30 DIAGNOSIS — Z794 Long term (current) use of insulin: Secondary | ICD-10-CM

## 2024-10-30 NOTE — Progress Notes (Signed)
 Cardiology Office Note:  .   Date:  10/30/2024  ID:  Douglas Phillips, DOB [birth year], MRN [id number] PCP: Dorena Fernando HERO, MD  [facility] Providers Cardiologist:  Oneil Parchment, MD     History of Present Illness: .   Douglas Phillips is a 48 y.o. male Discussed the use of AI scribe software  History of Present Illness Douglas Phillips is a 48 year old male with coronary artery disease and aortic valve replacement who presents for follow-up.  He has a history of coronary artery disease with elevated coronary calcium  in the circumflex LAD and an ascending aortic aneurysm measuring 49 mm. He underwent right and left heart catheterization, revealing moderate diffuse coronary artery disease with a high-grade lesion in the distal LAD. Due to the small size of the vessel, no intervention was pursued. He had an On-X mechanical valve placed on December 27, 2022, following a BENTAL procedure. An echocardiogram on January 04, 2023, showed a normally functioning mechanical aortic valve with an ejection fraction of 65-70%.  He continues to take atorvastatin  40 mg and fenofibrate  for cholesterol management, along with Coumadin  for his mechanical valve. His LDL is 55, triglycerides are 81, and creatinine is 0.9. His A1c is 5.8, and he has been on Ozempic  for about four months, which was added to his diabetes regimen. He reports some weight loss since starting Ozempic , although he has not been as active recently due to work commitments.  He has a history of a pericardial effusion that required a pericardial window. He also has type 2 diabetes.  He experiences migraines since he was 49 or 48 years old, which are typically stress-related. Recently, he has started experiencing aura and ocular migraines without pain, as well as fleeting moments of partial blindness, which he associates with neck positioning. No chest pain or symptoms related to coronary artery disease.      Studies Reviewed: SABRA    EKG Interpretation Date/Time:  Tuesday October 30 2024 14:52:33 EST Ventricular Rate:  67 PR Interval:  224 QRS Duration:  100 QT Interval:  404 QTC Calculation: 426 R Axis:   3  Text Interpretation: Sinus rhythm with 1st degree A-V block Possible Left atrial enlargement When compared with ECG of 28-Dec-2022 07:06, ST no longer elevated in Lateral leads Confirmed by Parchment Oneil ([id number]) on 10/30/2024 3:00:26 PM    Results LABS LDL: 55 Triglycerides: 81 Creatinine: 0.9 A1c: 5.8  DIAGNOSTIC Echocardiogram: Normal functioning mechanical aortic valve, ejection fraction 65-70% (01/04/2023) Risk Assessment/Calculations:            Physical Exam:   VS:  BP 122/68   Pulse 67   Ht 5' 7 (1.702 m)   Wt 240 lb (108.9 kg)   SpO2 97%   BMI 37.59 kg/m    Wt Readings from Last 3 Encounters:  10/30/24 240 lb (108.9 kg)  03/28/24 248 lb (112.5 kg)  04/28/23 243 lb 12.8 oz (110.6 kg)    GEN: Well nourished, well developed in no acute distress NECK: No JVD; No carotid bruits CARDIAC: Sharp S2, RRR, no murmurs, no rubs, no gallops RESPIRATORY:  Clear to auscultation without rales, wheezing or rhonchi  ABDOMEN: Soft, non-tender, non-distended EXTREMITIES:  No edema; No deformity   ASSESSMENT AND PLAN: .    Assessment and Plan Assessment & Plan Coronary artery disease with elevated coronary calcium  Moderate diffuse coronary artery disease with a high-grade lesion in the distal LAD. The vessel was small, so no intervention was pursued. Currently  well-managed with atorvastatin  and fenofibrate . LDL is 55, triglycerides are 81, indicating good lipid control. - Continue atorvastatin  40 mg daily - Continue fenofibrate  - Monitor lipid levels periodically  Ascending aortic aneurysm, 49 mm, status post BENTAL procedure with mechanical aortic valve replacement Status post BENTAL procedure with mechanical aortic valve replacement. Echocardiogram shows normal functioning mechanical aortic  valve with an ejection fraction of 65-70%. No current issues with the valve. - Continue Coumadin  therapy - Monitor INR regularly  Bicuspid aortic valve, status post mechanical valve replacement Status post mechanical valve replacement for bicuspid aortic valve. Echocardiogram shows normal functioning mechanical valve. He reports a clicking sound from the valve, which is expected with a mechanical valve. - Continue Coumadin  therapy - Monitor INR regularly  Type 2 diabetes mellitus Type 2 diabetes mellitus, currently managed with oral medications and Ozempic . A1c is 5.8, indicating good glycemic control. He reports weight loss challenges but is on Ozempic  to aid in weight management. - Continue current diabetes medications including Ozempic  - Monitor A1c and blood glucose levels regularly  Ocular migraine/migraine - Continue to discuss with neurology.       Dispo: 1 yr  Signed, Oneil Parchment, MD

## 2024-10-30 NOTE — Patient Instructions (Signed)
 Medication Instructions:  The current medical regimen is effective;  continue present plan and medications.  *If you need a refill on your cardiac medications before your next appointment, please call your pharmacy*  Follow-Up: At [facility], you and your health needs are our priority.  As part of our continuing mission to provide you with exceptional heart care, our providers are all part of one team.  This team includes your primary Cardiologist (physician) and Advanced Practice Providers or APPs (Physician Assistants and Nurse Practitioners) who all work together to provide you with the care you need, when you need it.  Your next appointment:   1 year(s)  Provider:   Dr Dorothye Gathers     We recommend signing up for the patient portal called "MyChart".  Sign up information is provided on this After Visit Summary.  MyChart is used to connect with patients for Virtual Visits (Telemedicine).  Patients are able to view lab/test results, encounter notes, upcoming appointments, etc.  Non-urgent messages can be sent to your provider as well.   To learn more about what you can do with MyChart, go to [email or web address].

## 2024-12-14 ENCOUNTER — Ambulatory Visit: Attending: Cardiology | Admitting: *Deleted

## 2024-12-14 DIAGNOSIS — I3139 Other pericardial effusion (noninflammatory): Secondary | ICD-10-CM | POA: Diagnosis not present

## 2024-12-14 DIAGNOSIS — Z7901 Long term (current) use of anticoagulants: Secondary | ICD-10-CM | POA: Diagnosis not present

## 2024-12-14 DIAGNOSIS — Z952 Presence of prosthetic heart valve: Secondary | ICD-10-CM | POA: Diagnosis not present

## 2024-12-14 LAB — POCT INR: INR: 4.4 — AB (ref 2.0–3.0)

## 2024-12-14 NOTE — Patient Instructions (Addendum)
 Description   *DOES NOT WANT AVS* INR-4.4; Do not take any warfarin today and no warfarin tomorrow then continue taking warfarin 1.5 tablets daily except for 1 tablet on Monday.  Stay consistent with greens each week. Recheck INR in 2 weeks (normally 8 weeks).   Anticoagulation Clinic [phone]

## 2024-12-14 NOTE — Progress Notes (Signed)
 INR goal 2-3  Description   *DOES NOT WANT AVS* INR-4.4; Do not take any warfarin today and no warfarin tomorrow then continue taking warfarin 1.5 tablets daily except for 1 tablet on Monday.  Stay consistent with greens each week. Recheck INR in 2 weeks (normally 8 weeks).   Anticoagulation Clinic [phone]

## 2024-12-28 ENCOUNTER — Ambulatory Visit: Admitting: Pharmacist

## 2024-12-28 DIAGNOSIS — I3139 Other pericardial effusion (noninflammatory): Secondary | ICD-10-CM

## 2024-12-28 DIAGNOSIS — I824Y9 Acute embolism and thrombosis of unspecified deep veins of unspecified proximal lower extremity: Secondary | ICD-10-CM | POA: Diagnosis not present

## 2024-12-28 DIAGNOSIS — Z7901 Long term (current) use of anticoagulants: Secondary | ICD-10-CM

## 2024-12-28 DIAGNOSIS — Z952 Presence of prosthetic heart valve: Secondary | ICD-10-CM | POA: Diagnosis not present

## 2024-12-28 LAB — POCT INR: INR: 3.3 — AB (ref 2.0–3.0)

## 2024-12-28 NOTE — Progress Notes (Signed)
 Description   *DOES NOT WANT AVS* INR-3.3; Hold dose today and then reduce to 1 tablet on Mondays and Fridays, 1.5 tablets the rest of the week. Stay consistent with greens each week. Recheck INR in 2 weeks (normally 8 weeks).   Anticoagulation Clinic [phone]

## 2024-12-28 NOTE — Patient Instructions (Signed)
 Description   *DOES NOT WANT AVS* INR-3.3; Hold dose today and then reduce to 1 tablet on Mondays and Fridays, 1.5 tablets the rest of the week. Stay consistent with greens each week. Recheck INR in 2 weeks (normally 8 weeks).   Anticoagulation Clinic [phone]

## 2025-01-18 ENCOUNTER — Ambulatory Visit

## 2025-01-31 ENCOUNTER — Ambulatory Visit
